# Patient Record
Sex: Female | Born: 1950 | Race: Black or African American | Hispanic: No | Marital: Single | Smoking: Never smoker
Health system: Southern US, Community
[De-identification: ages and names within clinical notes are randomized; demographics above are authoritative.]

## PROBLEM LIST (undated history)

## (undated) DIAGNOSIS — K219 Gastro-esophageal reflux disease without esophagitis: Secondary | ICD-10-CM

## (undated) DIAGNOSIS — D126 Benign neoplasm of colon, unspecified: Secondary | ICD-10-CM

## (undated) DIAGNOSIS — L409 Psoriasis, unspecified: Secondary | ICD-10-CM

## (undated) DIAGNOSIS — M1990 Unspecified osteoarthritis, unspecified site: Secondary | ICD-10-CM

## (undated) DIAGNOSIS — R87619 Unspecified abnormal cytological findings in specimens from cervix uteri: Secondary | ICD-10-CM

## (undated) DIAGNOSIS — E785 Hyperlipidemia, unspecified: Secondary | ICD-10-CM

## (undated) DIAGNOSIS — M71052 Abscess of bursa, left hip: Secondary | ICD-10-CM

## (undated) DIAGNOSIS — M069 Rheumatoid arthritis, unspecified: Secondary | ICD-10-CM

## (undated) DIAGNOSIS — I10 Essential (primary) hypertension: Secondary | ICD-10-CM

## (undated) HISTORY — DX: Hyperlipidemia, unspecified: E78.5

## (undated) HISTORY — PX: CYST EXCISION: SHX5701

## (undated) HISTORY — DX: Gastro-esophageal reflux disease without esophagitis: K21.9

## (undated) HISTORY — DX: Unspecified osteoarthritis, unspecified site: M19.90

## (undated) HISTORY — DX: Essential (primary) hypertension: I10

## (undated) HISTORY — DX: Unspecified abnormal cytological findings in specimens from cervix uteri: R87.619

---

## 2001-05-17 HISTORY — PX: ABDOMINAL HYSTERECTOMY: SHX81

## 2006-06-27 LAB — HM PAP SMEAR

## 2009-05-17 HISTORY — PX: BREAST CYST ASPIRATION: SHX578

## 2009-06-04 ENCOUNTER — Ambulatory Visit: Payer: Self-pay | Admitting: General Surgery

## 2009-06-10 ENCOUNTER — Ambulatory Visit: Payer: Self-pay | Admitting: General Surgery

## 2009-12-24 ENCOUNTER — Ambulatory Visit: Payer: Self-pay | Admitting: General Surgery

## 2010-05-17 HISTORY — PX: BREAST BIOPSY: SHX20

## 2010-05-28 ENCOUNTER — Ambulatory Visit: Payer: Self-pay | Admitting: Unknown Physician Specialty

## 2010-05-28 LAB — HM COLONOSCOPY

## 2010-05-29 LAB — PATHOLOGY REPORT

## 2011-03-04 ENCOUNTER — Ambulatory Visit: Payer: Self-pay

## 2012-03-09 ENCOUNTER — Ambulatory Visit: Payer: Self-pay | Admitting: Family Medicine

## 2013-03-12 ENCOUNTER — Ambulatory Visit: Payer: Self-pay

## 2013-03-15 ENCOUNTER — Ambulatory Visit: Payer: Self-pay

## 2013-03-26 ENCOUNTER — Ambulatory Visit: Payer: Self-pay

## 2013-07-24 DIAGNOSIS — M545 Low back pain, unspecified: Secondary | ICD-10-CM | POA: Diagnosis not present

## 2013-10-10 DIAGNOSIS — L659 Nonscarring hair loss, unspecified: Secondary | ICD-10-CM | POA: Diagnosis not present

## 2013-10-24 DIAGNOSIS — I10 Essential (primary) hypertension: Secondary | ICD-10-CM | POA: Diagnosis not present

## 2013-10-24 DIAGNOSIS — E785 Hyperlipidemia, unspecified: Secondary | ICD-10-CM | POA: Diagnosis not present

## 2013-10-24 DIAGNOSIS — E7800 Pure hypercholesterolemia, unspecified: Secondary | ICD-10-CM | POA: Diagnosis not present

## 2014-02-11 DIAGNOSIS — Z23 Encounter for immunization: Secondary | ICD-10-CM | POA: Diagnosis not present

## 2014-02-11 DIAGNOSIS — Z006 Encounter for examination for normal comparison and control in clinical research program: Secondary | ICD-10-CM | POA: Diagnosis not present

## 2014-02-11 DIAGNOSIS — I10 Essential (primary) hypertension: Secondary | ICD-10-CM | POA: Diagnosis not present

## 2014-02-11 DIAGNOSIS — E785 Hyperlipidemia, unspecified: Secondary | ICD-10-CM | POA: Diagnosis not present

## 2014-03-05 DIAGNOSIS — E785 Hyperlipidemia, unspecified: Secondary | ICD-10-CM | POA: Diagnosis not present

## 2014-03-06 DIAGNOSIS — S00512A Abrasion of oral cavity, initial encounter: Secondary | ICD-10-CM | POA: Diagnosis not present

## 2014-03-28 ENCOUNTER — Ambulatory Visit: Payer: Self-pay

## 2014-03-28 DIAGNOSIS — Z1231 Encounter for screening mammogram for malignant neoplasm of breast: Secondary | ICD-10-CM | POA: Diagnosis not present

## 2014-03-28 LAB — HM MAMMOGRAPHY

## 2014-09-04 DIAGNOSIS — E785 Hyperlipidemia, unspecified: Secondary | ICD-10-CM | POA: Diagnosis not present

## 2014-09-04 DIAGNOSIS — I10 Essential (primary) hypertension: Secondary | ICD-10-CM | POA: Diagnosis not present

## 2014-10-24 ENCOUNTER — Other Ambulatory Visit: Payer: Self-pay | Admitting: Unknown Physician Specialty

## 2014-11-11 ENCOUNTER — Other Ambulatory Visit: Payer: Self-pay | Admitting: Unknown Physician Specialty

## 2015-01-21 ENCOUNTER — Other Ambulatory Visit: Payer: Self-pay | Admitting: Unknown Physician Specialty

## 2015-02-10 ENCOUNTER — Other Ambulatory Visit: Payer: Self-pay | Admitting: Unknown Physician Specialty

## 2015-03-11 DIAGNOSIS — E785 Hyperlipidemia, unspecified: Secondary | ICD-10-CM | POA: Insufficient documentation

## 2015-03-11 DIAGNOSIS — M059 Rheumatoid arthritis with rheumatoid factor, unspecified: Secondary | ICD-10-CM | POA: Insufficient documentation

## 2015-03-11 DIAGNOSIS — I10 Essential (primary) hypertension: Secondary | ICD-10-CM | POA: Insufficient documentation

## 2015-03-11 DIAGNOSIS — M069 Rheumatoid arthritis, unspecified: Secondary | ICD-10-CM | POA: Insufficient documentation

## 2015-03-11 DIAGNOSIS — E669 Obesity, unspecified: Secondary | ICD-10-CM

## 2015-03-11 DIAGNOSIS — R748 Abnormal levels of other serum enzymes: Secondary | ICD-10-CM | POA: Insufficient documentation

## 2015-03-18 ENCOUNTER — Ambulatory Visit (INDEPENDENT_AMBULATORY_CARE_PROVIDER_SITE_OTHER): Payer: Medicare Other | Admitting: Unknown Physician Specialty

## 2015-03-18 ENCOUNTER — Encounter: Payer: Self-pay | Admitting: Unknown Physician Specialty

## 2015-03-18 VITALS — BP 128/73 | HR 80 | Temp 98.6°F | Ht 65.3 in | Wt 241.2 lb

## 2015-03-18 DIAGNOSIS — Z23 Encounter for immunization: Secondary | ICD-10-CM

## 2015-03-18 DIAGNOSIS — R748 Abnormal levels of other serum enzymes: Secondary | ICD-10-CM

## 2015-03-18 DIAGNOSIS — G4700 Insomnia, unspecified: Secondary | ICD-10-CM | POA: Diagnosis not present

## 2015-03-18 DIAGNOSIS — I10 Essential (primary) hypertension: Secondary | ICD-10-CM

## 2015-03-18 DIAGNOSIS — E785 Hyperlipidemia, unspecified: Secondary | ICD-10-CM | POA: Diagnosis not present

## 2015-03-18 DIAGNOSIS — Z0000 Encounter for general adult medical examination without abnormal findings: Secondary | ICD-10-CM | POA: Diagnosis not present

## 2015-03-18 DIAGNOSIS — J309 Allergic rhinitis, unspecified: Secondary | ICD-10-CM | POA: Diagnosis not present

## 2015-03-18 DIAGNOSIS — Z13820 Encounter for screening for osteoporosis: Secondary | ICD-10-CM

## 2015-03-18 DIAGNOSIS — E669 Obesity, unspecified: Secondary | ICD-10-CM | POA: Diagnosis not present

## 2015-03-18 LAB — MICROALBUMIN, URINE WAIVED
CREATININE, URINE WAIVED: 10 mg/dL (ref 10–300)
MICROALB, UR WAIVED: 10 mg/L (ref 0–19)
Microalb/Creat Ratio: <30 mg/g (ref <30)

## 2015-03-18 MED ORDER — MONTELUKAST SODIUM 10 MG PO TABS: 10.0000 mg | ORAL_TABLET | Freq: Every day | ORAL | Status: DC

## 2015-03-18 NOTE — Patient Instructions (Addendum)
Insomnia Insomnia is a sleep disorder that makes it difficult to fall asleep or to stay asleep. Insomnia can cause tiredness (fatigue), low energy, difficulty concentrating, mood swings, and poor performance at work or school.  There are three different ways to classify insomnia:  Difficulty falling asleep.  Difficulty staying asleep.  Waking up too early in the morning. Any type of insomnia can be long-term (chronic) or short-term (acute). Both are common. Short-term insomnia usually lasts for three months or less. Chronic insomnia occurs at least three times a week for longer than three months. CAUSES  Insomnia may be caused by another condition, situation, or substance, such as:  Anxiety.  Certain medicines.  Gastroesophageal reflux disease (GERD) or other gastrointestinal conditions.  Asthma or other breathing conditions.  Restless legs syndrome, sleep apnea, or other sleep disorders.  Chronic pain.  Menopause. This may include hot flashes.  Stroke.  Abuse of alcohol, tobacco, or illegal drugs.  Depression.  Caffeine.   Neurological disorders, such as Alzheimer disease.  An overactive thyroid (hyperthyroidism). The cause of insomnia may not be known. RISK FACTORS Risk factors for insomnia include:  Gender. Women are more commonly affected than men.  Age. Insomnia is more common as you get older.  Stress. This may involve your professional or personal life.  Income. Insomnia is more common in people with lower income.  Lack of exercise.   Irregular work schedule or night shifts.  Traveling between different time zones. SIGNS AND SYMPTOMS If you have insomnia, trouble falling asleep or trouble staying asleep is the main symptom. This may lead to other symptoms, such as:  Feeling fatigued.  Feeling nervous about going to sleep.  Not feeling rested in the morning.  Having trouble concentrating.  Feeling irritable, anxious, or depressed. TREATMENT   Treatment for insomnia depends on the cause. If your insomnia is caused by an underlying condition, treatment will focus on addressing the condition. Treatment may also include:   Medicines to help you sleep.  Counseling or therapy.  Lifestyle adjustments. HOME CARE INSTRUCTIONS   Take medicines only as directed by your health care provider.  Keep regular sleeping and waking hours. Avoid naps.  Keep a sleep diary to help you and your health care provider figure out what could be causing your insomnia. Include:   When you sleep.  When you wake up during the night.  How well you sleep.   How rested you feel the next day.  Any side effects of medicines you are taking.  What you eat and drink.   Make your bedroom a comfortable place where it is easy to fall asleep:  Put up shades or special blackout curtains to block light from outside.  Use a white noise machine to block noise.  Keep the temperature cool.   Exercise regularly as directed by your health care provider. Avoid exercising right before bedtime.  Use relaxation techniques to manage stress. Ask your health care provider to suggest some techniques that may work well for you. These may include:  Breathing exercises.  Routines to release muscle tension.  Visualizing peaceful scenes.  Cut back on alcohol, caffeinated beverages, and cigarettes, especially close to bedtime. These can disrupt your sleep.  Do not overeat or eat spicy foods right before bedtime. This can lead to digestive discomfort that can make it hard for you to sleep.  Limit screen use before bedtime. This includes:  Watching TV.  Using your smartphone, tablet, and computer.  Stick to a routine. This   can help you fall asleep faster. Try to do a quiet activity, brush your teeth, and go to bed at the same time each night.  Get out of bed if you are still awake after 15 minutes of trying to sleep. Keep the lights down, but try reading or  doing a quiet activity. When you feel sleepy, go back to bed.  Make sure that you drive carefully. Avoid driving if you feel very sleepy.  Keep all follow-up appointments as directed by your health care provider. This is important. SEEK MEDICAL CARE IF:   You are tired throughout the day or have trouble in your daily routine due to sleepiness.  You continue to have sleep problems or your sleep problems get worse. SEEK IMMEDIATE MEDICAL CARE IF:   You have serious thoughts about hurting yourself or someone else.   This information is not intended to replace advice given to you by your health care provider. Make sure you discuss any questions you have with your health care provider.   Document Released: 04/30/2000 Document Revised: 01/22/2015 Document Reviewed: 02/01/2014 Elsevier Interactive Patient Education 2016 [employer].   Insomnia is a frustrating problem and fortunately, for most, it can be managed with lifestyle changes.  I  find the most effective strategies seem to be exercise, decreasing caffeine and screen time.    In addition to the above, studies have shown that cognitive behavioral therapies for sleep are more effective than medications.  There are some less expensive on-line programs for this that are a self-paced 6 week program.  Go to shuti.com(used by sleep labs) or [employer].  There are others that are probably just as effective.

## 2015-03-18 NOTE — Assessment & Plan Note (Signed)
Noticing wheezing and nasal congestion in the AM

## 2015-03-18 NOTE — Progress Notes (Signed)
+-+++++++++++++ ---  BP 128/73 mmHg  Pulse 80  Temp(Src) 98.6 F (37 C)  Ht 5' 5.3" (1.659 m)  Wt 241 lb 3.2 oz (109.408 kg)  BMI 39.75 kg/m2  SpO2 96%  LMP  (LMP Unknown)   Subjective:    Patient ID: Angela Watts, female    DOB: 1951-05-06, 64 y.o.   MRN: [id number]  HPI: Angela Watts is a 64 y.o. female  Chief Complaint  Patient presents with  . Medicare Wellness   Hypertension:  Using medications without difficulty.  Second reading shows 128/73.   Average home BPs: does not check   Using medication without problems or lightheadedness No chest pain with exertion or shortness of breath but is noticing some "wheezing" in the AM.   Edema: no Other issues: as above  Elevated Cholesterol Using medications without problems Muscle aches none Diet compliance: good Exercise: "not as much as I should  Insomnia Goes to sleep "late" and watches TV.  States she wakes up at 4-5 A.    Depression screen PHQ 2/9 03/18/2015  Decreased Interest 0  Down, Depressed, Hopeless 0  PHQ - 2 Score 0    Functional Status Survey: Is the patient deaf or have difficulty hearing?: No Does the patient have difficulty seeing, even when wearing glasses/contacts?: No Does the patient have difficulty concentrating, remembering, or making decisions?: No Does the patient have difficulty walking or climbing stairs?: No Does the patient have difficulty dressing or bathing?: No Does the patient have difficulty doing errands alone such as visiting a doctor's office or shopping?: No  Fall Risk  03/18/2015  Falls in the past year? No   Relevant past medical, surgical, family and social history reviewed and updated as indicated. Interim medical history since our last visit reviewed. Allergies and medications reviewed and updated.  See functional status, depression screen, and fall's risk assessment  under the appropriate section.    Pt is able to perform complex mental tasks, recognize clock  face, recognize time and do a 3 item recall.       Relevant past medical, surgical, family and social history reviewed and updated as indicated. Interim medical history since our last visit reviewed. Allergies and medications reviewed and updated.  Review of Systems  Constitutional: Negative.   HENT: Negative.   Eyes: Negative.   Respiratory: Negative.   Cardiovascular: Negative.   Gastrointestinal: Negative.   Endocrine: Negative.   Genitourinary: Negative.   Musculoskeletal: Negative.   Skin: Negative.   Allergic/Immunologic: Negative.   Neurological: Negative.   Hematological: Negative.   Psychiatric/Behavioral: Negative.     Per HPI unless specifically indicated above     Objective:    BP 128/73 mmHg  Pulse 80  Temp(Src) 98.6 F (37 C)  Ht 5' 5.3" (1.659 m)  Wt 241 lb 3.2 oz (109.408 kg)  BMI 39.75 kg/m2  SpO2 96%  LMP  (LMP Unknown)  Wt Readings from Last 3 Encounters:  03/18/15 241 lb 3.2 oz (109.408 kg)  09/04/14 235 lb (106.595 kg)    Physical Exam  Constitutional: She is oriented to person, place, and time. She appears well-developed and well-nourished.  HENT:  Head: Normocephalic and atraumatic.  Eyes: Pupils are equal, round, and reactive to light. Right eye exhibits no discharge. Left eye exhibits no discharge. No scleral icterus.  Neck: Normal range of motion. Neck supple. Carotid bruit is not present. No thyromegaly present.  Cardiovascular: Normal rate, regular rhythm and normal heart sounds.  Exam reveals no  gallop and no friction rub.   No murmur heard. Pulmonary/Chest: Effort normal and breath sounds normal. No respiratory distress. She has no wheezes. She has no rales.  Abdominal: Soft. Bowel sounds are normal. There is no tenderness. There is no rebound.  Genitourinary: No breast swelling, tenderness or discharge.  Musculoskeletal: Normal range of motion.  Lymphadenopathy:    She has no cervical adenopathy.  Neurological: She is alert and  oriented to person, place, and time.  Skin: Skin is warm, dry and intact. No rash noted.  Psychiatric: She has a normal mood and affect. Her speech is normal and behavior is normal. Judgment and thought content normal. Cognition and memory are normal.       Assessment & Plan:   Problem List Items Addressed This Visit      Unprioritized   Hypertension   Relevant Medications   aspirin 81 MG tablet   Other Relevant Orders   Comprehensive metabolic panel   Lipid Panel w/o Chol/HDL Ratio   Uric acid   Microalbumin, Urine Waived   Hyperlipidemia   Relevant Medications   aspirin 81 MG tablet   Other Relevant Orders   Lipid Panel w/o Chol/HDL Ratio   Obesity   Elevated alkaline phosphatase level   Allergic rhinitis    Noticing wheezing and nasal congestion in the AM      Insomnia    Lifestyle changes discussed       Other Visit Diagnoses    Immunization due    -  Primary    Relevant Orders    Tdap vaccine greater than or equal to 7yo IM (Completed)    Flu Vaccine QUAD 36+ mos PF IM (Fluarix & Fluzone Quad PF) (Completed)    Routine general medical examination at a health care facility        Relevant Orders    Hepatitis C antibody    Screening for osteoporosis            Follow up plan: No Follow-up on file.

## 2015-03-18 NOTE — Assessment & Plan Note (Signed)
Lifestyle changes discussed

## 2015-03-19 ENCOUNTER — Encounter: Payer: Self-pay | Admitting: Unknown Physician Specialty

## 2015-03-19 LAB — COMPREHENSIVE METABOLIC PANEL
ALBUMIN: 3.8 g/dL (ref 3.6–4.8)
ALK PHOS: 192 IU/L — AB (ref 39–117)
ALT: 35 IU/L — ABNORMAL HIGH (ref 0–32)
AST: 26 IU/L (ref 0–40)
Albumin/Globulin Ratio: 1.2 (ref 1.1–2.5)
BUN / CREAT RATIO: 16 (ref 11–26)
BUN: 13 mg/dL (ref 8–27)
Bilirubin Total: 0.6 mg/dL (ref 0.0–1.2)
CO2: 21 mmol/L (ref 18–29)
CREATININE: 0.82 mg/dL (ref 0.57–1.00)
Calcium: 9.2 mg/dL (ref 8.7–10.3)
Chloride: 103 mmol/L (ref 97–106)
GFR, EST AFRICAN AMERICAN: 86 mL/min/{1.73_m2} (ref >59)
GFR, EST NON AFRICAN AMERICAN: 75 mL/min/{1.73_m2} (ref >59)
GLOBULIN, TOTAL: 3.3 g/dL (ref 1.5–4.5)
GLUCOSE: 89 mg/dL (ref 65–99)
Potassium: 4.1 mmol/L (ref 3.5–5.2)
SODIUM: 142 mmol/L (ref 136–144)
TOTAL PROTEIN: 7.1 g/dL (ref 6.0–8.5)

## 2015-03-19 LAB — LIPID PANEL W/O CHOL/HDL RATIO
CHOLESTEROL TOTAL: 172 mg/dL (ref 100–199)
HDL: 56 mg/dL (ref >39)
LDL CALC: 104 mg/dL — AB (ref 0–99)
Triglycerides: 59 mg/dL (ref 0–149)
VLDL CHOLESTEROL CAL: 12 mg/dL (ref 5–40)

## 2015-03-19 LAB — HEPATITIS C ANTIBODY: Hep C Virus Ab: <0.1 s/co ratio (ref 0.0–0.9)

## 2015-03-19 LAB — URIC ACID: URIC ACID: 6.6 mg/dL (ref 2.5–7.1)

## 2015-03-19 NOTE — Progress Notes (Signed)
Quick Note:  Letter sent to call us about getting an Ultrasound due to elevated Alk Phos ______

## 2015-03-20 ENCOUNTER — Telehealth: Payer: Self-pay | Admitting: Unknown Physician Specialty

## 2015-03-20 DIAGNOSIS — H2513 Age-related nuclear cataract, bilateral: Secondary | ICD-10-CM | POA: Diagnosis not present

## 2015-03-20 NOTE — Telephone Encounter (Signed)
Patient said someone called her regarding her results but she did not know who it was. It was not me, but I did follow-up. I called her and told her her labs were complete, but I did not want to tell her the results via phone without Cheryl's consent. But I did tell her she got a letter sent out 03/19/2015 with her results enclosed. She should get that letter within the next couple of days. She was very nice and said she was just trying to return the call for whoever called her. I asked her if she had any questions and she declined. Told her if she did, to please give [place] a call.

## 2015-03-20 NOTE — Telephone Encounter (Signed)
Please disregard. [facility] handled it. Thanks.

## 2015-03-21 ENCOUNTER — Other Ambulatory Visit: Payer: Self-pay | Admitting: Unknown Physician Specialty

## 2015-03-21 DIAGNOSIS — R748 Abnormal levels of other serum enzymes: Secondary | ICD-10-CM

## 2015-03-21 NOTE — Telephone Encounter (Signed)
Malachy Mood did you by any chance all this patient about lab results?

## 2015-03-21 NOTE — Telephone Encounter (Signed)
I did call and got hold of her today.  I ordered an abdominal US for elevated Alkaline Phosphotase

## 2015-04-14 ENCOUNTER — Ambulatory Visit
Admission: RE | Admit: 2015-04-14 | Discharge: 2015-04-14 | Disposition: A | Discharge status: Home / Self Care | Payer: Medicare Other | Source: Ambulatory Visit | Attending: Unknown Physician Specialty | Admitting: Unknown Physician Specialty

## 2015-04-14 DIAGNOSIS — R748 Abnormal levels of other serum enzymes: Secondary | ICD-10-CM | POA: Diagnosis not present

## 2015-04-23 ENCOUNTER — Other Ambulatory Visit: Payer: Self-pay | Admitting: Unknown Physician Specialty

## 2015-05-13 ENCOUNTER — Other Ambulatory Visit: Payer: Self-pay | Admitting: Unknown Physician Specialty

## 2015-06-11 ENCOUNTER — Ambulatory Visit: Payer: Medicare Other | Admitting: Anesthesiology

## 2015-06-11 ENCOUNTER — Encounter
Admission: RE | Disposition: A | Discharge status: Home / Self Care | Payer: Self-pay | Source: Ambulatory Visit | Attending: Unknown Physician Specialty

## 2015-06-11 ENCOUNTER — Ambulatory Visit
Admission: RE | Admit: 2015-06-11 | Discharge: 2015-06-11 | Disposition: A | Discharge status: Home / Self Care | Payer: Medicare Other | Source: Ambulatory Visit | Attending: Unknown Physician Specialty | Admitting: Unknown Physician Specialty

## 2015-06-11 ENCOUNTER — Encounter: Payer: Self-pay | Admitting: *Deleted

## 2015-06-11 DIAGNOSIS — D120 Benign neoplasm of cecum: Secondary | ICD-10-CM | POA: Insufficient documentation

## 2015-06-11 DIAGNOSIS — M1990 Unspecified osteoarthritis, unspecified site: Secondary | ICD-10-CM | POA: Insufficient documentation

## 2015-06-11 DIAGNOSIS — Z86010 Personal history of colonic polyps: Secondary | ICD-10-CM | POA: Diagnosis not present

## 2015-06-11 DIAGNOSIS — K5730 Diverticulosis of large intestine without perforation or abscess without bleeding: Secondary | ICD-10-CM | POA: Diagnosis not present

## 2015-06-11 DIAGNOSIS — I10 Essential (primary) hypertension: Secondary | ICD-10-CM | POA: Diagnosis not present

## 2015-06-11 DIAGNOSIS — E785 Hyperlipidemia, unspecified: Secondary | ICD-10-CM | POA: Insufficient documentation

## 2015-06-11 DIAGNOSIS — K635 Polyp of colon: Secondary | ICD-10-CM | POA: Diagnosis not present

## 2015-06-11 DIAGNOSIS — K640 First degree hemorrhoids: Secondary | ICD-10-CM | POA: Insufficient documentation

## 2015-06-11 DIAGNOSIS — Z7982 Long term (current) use of aspirin: Secondary | ICD-10-CM | POA: Insufficient documentation

## 2015-06-11 DIAGNOSIS — K219 Gastro-esophageal reflux disease without esophagitis: Secondary | ICD-10-CM | POA: Diagnosis not present

## 2015-06-11 DIAGNOSIS — Z79899 Other long term (current) drug therapy: Secondary | ICD-10-CM | POA: Diagnosis not present

## 2015-06-11 DIAGNOSIS — Z1211 Encounter for screening for malignant neoplasm of colon: Secondary | ICD-10-CM | POA: Insufficient documentation

## 2015-06-11 DIAGNOSIS — K2960 Other gastritis without bleeding: Secondary | ICD-10-CM | POA: Diagnosis not present

## 2015-06-11 DIAGNOSIS — K317 Polyp of stomach and duodenum: Secondary | ICD-10-CM | POA: Diagnosis not present

## 2015-06-11 DIAGNOSIS — D125 Benign neoplasm of sigmoid colon: Secondary | ICD-10-CM | POA: Insufficient documentation

## 2015-06-11 DIAGNOSIS — K5790 Diverticulosis of intestine, part unspecified, without perforation or abscess without bleeding: Secondary | ICD-10-CM | POA: Diagnosis not present

## 2015-06-11 DIAGNOSIS — Z6837 Body mass index (BMI) 37.0-37.9, adult: Secondary | ICD-10-CM | POA: Diagnosis not present

## 2015-06-11 DIAGNOSIS — K648 Other hemorrhoids: Secondary | ICD-10-CM | POA: Diagnosis not present

## 2015-06-11 HISTORY — PX: COLONOSCOPY WITH PROPOFOL: SHX5780

## 2015-06-11 SURGERY — COLONOSCOPY WITH PROPOFOL
Anesthesia: General

## 2015-06-11 MED ORDER — MIDAZOLAM HCL 2 MG/2ML IJ SOLN
INTRAMUSCULAR | Status: DC | PRN
  Administered 2015-06-11: 1 mg via INTRAVENOUS

## 2015-06-11 MED ORDER — SODIUM CHLORIDE 0.9 % IV SOLN
INTRAVENOUS | Status: DC
  Administered 2015-06-11: 09:00:00 via INTRAVENOUS

## 2015-06-11 MED ORDER — SODIUM CHLORIDE 0.9 % IV SOLN: INTRAVENOUS | Status: DC

## 2015-06-11 MED ORDER — FENTANYL CITRATE (PF) 100 MCG/2ML IJ SOLN
INTRAMUSCULAR | Status: DC | PRN
  Administered 2015-06-11: 1 ug via INTRAVENOUS
  Administered 2015-06-11: 49 ug via INTRAVENOUS

## 2015-06-11 MED ORDER — PROPOFOL 10 MG/ML IV BOLUS
INTRAVENOUS | Status: DC | PRN
  Administered 2015-06-11: 30 mg via INTRAVENOUS
  Administered 2015-06-11: 20 mg via INTRAVENOUS

## 2015-06-11 MED ORDER — PROPOFOL 500 MG/50ML IV EMUL
INTRAVENOUS | Status: DC | PRN
  Administered 2015-06-11: 120 ug/kg/min via INTRAVENOUS

## 2015-06-11 NOTE — Op Note (Signed)
[facility] Gastroenterology Patient Name: Angela Watts Procedure Date: 06/11/2015 9:30 AM MRN: [id number] Account #: [id number] Date of Birth: Feb 27, 1951 Admit Type: Outpatient Age: 65 Room: [facility] ENDO ROOM 4 Gender: Female Note Status: Finalized Procedure:         Colonoscopy Indications:       High risk colon cancer surveillance: Personal history of                     colonic polyps Providers:         Manya Silvas, MD Referring MD:      Guadalupe Maple, MD (Referring MD) Medicines:         Propofol per Anesthesia Complications:     No immediate complications. Procedure:         Pre-Anesthesia Assessment:                    - After reviewing the risks and benefits, the patient was                     deemed in satisfactory condition to undergo the procedure.                    After obtaining informed consent, the colonoscope was                     passed under direct vision. Throughout the procedure, the                     patient's blood pressure, pulse, and oxygen saturations                     were monitored continuously. The Colonoscope was                     introduced through the anus and advanced to the the cecum,                     identified by appendiceal orifice and ileocecal valve. The                     colonoscopy was performed without difficulty. The patient                     tolerated the procedure well. The quality of the bowel                     preparation was good. Findings:      A diminutive polyp was found in the cecum. The polyp was sessile. The       polyp was removed with a jumbo cold forceps. Resection and retrieval       were complete.      A diminutive polyp was found in the proximal sigmoid colon. The polyp       was sessile. The polyp was removed with a jumbo cold forceps. Resection       and retrieval were complete.      A few medium-mouthed diverticula were found in the sigmoid colon and in       the  descending colon.      Internal hemorrhoids were found during endoscopy. The hemorrhoids were       small and Grade I (internal hemorrhoids that do not prolapse).      The exam was otherwise without abnormality.  Impression:        - One diminutive polyp in the cecum. Resected and                     retrieved.                    - One diminutive polyp in the proximal sigmoid colon.                     Resected and retrieved.                    - Diverticulosis in the sigmoid colon and in the                     descending colon.                    - Internal hemorrhoids.                    - The examination was otherwise normal. Recommendation:    - Await pathology results. Manya Silvas, MD 06/11/2015 9:52:55 AM This report has been signed electronically. Number of Addenda: 0 Note Initiated On: 06/11/2015 9:30 AM Scope Withdrawal Time: 0 hours 11 minutes 12 seconds  Total Procedure Duration: 0 hours 15 minutes 27 seconds       [facility]

## 2015-06-11 NOTE — Anesthesia Preprocedure Evaluation (Signed)
Anesthesia Evaluation  Patient identified by MRN, date of birth, ID band Patient awake    Reviewed: Allergy & Precautions, NPO status , Patient's Chart, lab work & pertinent test results  History of Anesthesia Complications (+) MALIGNANT HYPERTHERMIA  Airway Mallampati: II       Dental  (+) Teeth Intact   Pulmonary neg pulmonary ROS,     + decreased breath sounds      Cardiovascular hypertension, Pt. on medications  Rhythm:Regular     Neuro/Psych negative neurological ROS     GI/Hepatic Neg liver ROS, GERD  ,  Endo/Other  Morbid obesity  Renal/GU negative Renal ROS     Musculoskeletal   Abdominal (+) + obese,   Peds  Hematology   Anesthesia Other Findings   Reproductive/Obstetrics                             Anesthesia Physical Anesthesia Plan  ASA: II  Anesthesia Plan: General   Post-op Pain Management:    Induction: Intravenous  Airway Management Planned:   Additional Equipment:   Intra-op Plan:   Post-operative Plan:   Informed Consent: I have reviewed the patients History and Physical, chart, labs and discussed the procedure including the risks, benefits and alternatives for the proposed anesthesia with the patient or authorized representative who has indicated his/her understanding and acceptance.     Plan Discussed with:   Anesthesia Plan Comments:         Anesthesia Quick Evaluation

## 2015-06-11 NOTE — H&P (Signed)
   Primary Care Physician:  Kathrine Haddock, NP Primary Gastroenterologist:  Dr. Vira Agar  Pre-Procedure History & Physical: HPI:  Angela Watts is a 66 y.o. female is here for an colonoscopy.   Past Medical History  Diagnosis Date  . Hyperlipidemia   . Hypertension   . GERD (gastroesophageal reflux disease)   . Arthritis   . Abnormal Pap smear of cervix     Past Surgical History  Procedure Laterality Date  . Abdominal hysterectomy    . Cyst excision Left     breast    Prior to Admission medications   Medication Sig Start Date End Date Taking? Authorizing Provider  aspirin 81 MG tablet Take 81 mg by mouth daily.   Yes Historical Provider, MD  atorvastatin (LIPITOR) 20 MG tablet TAKE ONE TABLET BY MOUTH ONCE DAILY 05/13/15  Yes Megan P Johnson, DO  Ca Phosphate-Cholecalciferol (CALTRATE GUMMY BITES) 250-400 MG-UNIT CHEW Chew by mouth daily.   Yes Historical Provider, MD  losartan (COZAAR) 50 MG tablet TAKE ONE TABLET BY MOUTH ONCE DAILY 04/23/15  Yes Kathrine Haddock, NP  Multiple Vitamin (MULTIVITAMIN) tablet Take 1 tablet by mouth daily.   Yes Historical Provider, MD  montelukast (SINGULAIR) 10 MG tablet Take 1 tablet (10 mg total) by mouth at bedtime. 03/18/15   Kathrine Haddock, NP    Allergies as of 05/09/2015  . (No Known Allergies)    Family History  Problem Relation Age of Onset  . Hypertension Mother   . Arthritis Mother   . Cancer Father     lung  . Hypertension Father   . Diabetes Sister   . Hypertension Sister   . Hypertension Brother   . Hypertension Sister   . Hypertension Sister   . Hypertension Sister   . Hypertension Brother   . Cancer Brother     lung    Social History   Social History  . Marital Status: Single    Spouse Name: N/A  . Number of Children: N/A  . Years of Education: N/A   Occupational History  . Not on file.   Social History Main Topics  . Smoking status: Never Smoker   . Smokeless tobacco: Never Used  . Alcohol Use: No   Comment: on occasion  . Drug Use: No  . Sexual Activity: No   Other Topics Concern  . Not on file   Social History Narrative    Review of Systems: See HPI, otherwise negative ROS  Physical Exam: BP 133/72 mmHg  Pulse 88  Temp(Src) 97.1 F (36.2 C) (Tympanic)  Resp 20  Ht 5\' 6"  (1.676 m)  Wt 106.595 kg (235 lb)  BMI 37.95 kg/m2  SpO2 99%  LMP  (LMP Unknown) General:   Alert,  pleasant and cooperative in NAD Head:  Normocephalic and atraumatic. Neck:  Supple; no masses or thyromegaly. Lungs:  Clear throughout to auscultation.    Heart:  Regular rate and rhythm. Abdomen:  Soft, nontender and nondistended. Normal bowel sounds, without guarding, and without rebound.   Neurologic:  Alert and  oriented x4;  grossly normal neurologically.  Impression/Plan: Angela Watts is here for an colonoscopy to be performed for [facility] colon polyps  Risks, benefits, limitations, and alternatives regarding  colonoscopy have been reviewed with the patient.  Questions have been answered.  All parties agreeable.   Gaylyn Cheers, MD  06/11/2015, 9:27 AM

## 2015-06-11 NOTE — Transfer of Care (Signed)
Immediate Anesthesia Transfer of Care Note  Patient: Angela Watts  Procedure(s) Performed: Procedure(s): COLONOSCOPY WITH PROPOFOL (N/A)  Patient Location: PACU  Anesthesia Type:General  Level of Consciousness: awake  Airway & Oxygen Therapy: Patient Spontanous Breathing and Patient connected to nasal cannula oxygen  Post-op Assessment: Report given to RN  Post vital signs: Reviewed and stable  Last Vitals:  Filed Vitals:   06/11/15 0830  BP: 133/72  Pulse: 88  Temp: 36.2 C  Resp: 20    Complications: No apparent anesthesia complications

## 2015-06-11 NOTE — Anesthesia Postprocedure Evaluation (Signed)
Anesthesia Post Note  Patient: Angela Watts  Procedure(s) Performed: Procedure(s) (LRB): COLONOSCOPY WITH PROPOFOL (N/A)  Patient location during evaluation: PACU Anesthesia Type: General Level of consciousness: awake Pain management: pain level controlled Vital Signs Assessment: post-procedure vital signs reviewed and stable Respiratory status: spontaneous breathing Cardiovascular status: stable Anesthetic complications: no    Last Vitals:  Filed Vitals:   06/11/15 0830  BP: 133/72  Pulse: 88  Temp: 36.2 C  Resp: 20    Last Pain: There were no vitals filed for this visit.               VAN STAVEREN,Lige Lakeman

## 2015-06-12 LAB — SURGICAL PATHOLOGY

## 2015-07-22 ENCOUNTER — Other Ambulatory Visit: Payer: Self-pay | Admitting: Unknown Physician Specialty

## 2015-08-20 ENCOUNTER — Other Ambulatory Visit: Payer: Self-pay | Admitting: Unknown Physician Specialty

## 2015-09-17 ENCOUNTER — Encounter: Payer: Self-pay | Admitting: Unknown Physician Specialty

## 2015-09-17 ENCOUNTER — Ambulatory Visit (INDEPENDENT_AMBULATORY_CARE_PROVIDER_SITE_OTHER): Payer: Medicare Other | Admitting: Unknown Physician Specialty

## 2015-09-17 ENCOUNTER — Other Ambulatory Visit: Payer: Self-pay | Admitting: Unknown Physician Specialty

## 2015-09-17 VITALS — BP 138/70 | HR 72 | Temp 97.9°F | Ht 65.1 in | Wt 243.0 lb

## 2015-09-17 DIAGNOSIS — E669 Obesity, unspecified: Secondary | ICD-10-CM | POA: Diagnosis not present

## 2015-09-17 DIAGNOSIS — I10 Essential (primary) hypertension: Secondary | ICD-10-CM

## 2015-09-17 DIAGNOSIS — R748 Abnormal levels of other serum enzymes: Secondary | ICD-10-CM

## 2015-09-17 DIAGNOSIS — E785 Hyperlipidemia, unspecified: Secondary | ICD-10-CM

## 2015-09-17 LAB — BAYER DCA HB A1C WAIVED: HB A1C: 5.8 % (ref <7.0)

## 2015-09-17 MED ORDER — ATORVASTATIN CALCIUM 20 MG PO TABS: 20.0000 mg | ORAL_TABLET | Freq: Every day | ORAL | Status: DC

## 2015-09-17 MED ORDER — LOSARTAN POTASSIUM 50 MG PO TABS: 50.0000 mg | ORAL_TABLET | Freq: Every day | ORAL | Status: DC

## 2015-09-17 NOTE — Assessment & Plan Note (Signed)
Stable, continue present medications.   

## 2015-09-17 NOTE — Progress Notes (Signed)
   BP 138/70 mmHg  Pulse 72  Temp(Src) 97.9 F (36.6 C)  Ht 5' 5.1" (1.654 m)  Wt 243 lb (110.224 kg)  BMI 40.29 kg/m2  SpO2 96%  LMP  (LMP Unknown)   Subjective:    Patient ID: Angela Watts, female    DOB: 09-30-1950, 65 y.o.   MRN: [id number]  HPI: Angela Watts is a 65 y.o. female  Chief Complaint  Patient presents with  . Hypertension  . Hyperlipidemia   Hypertension Using medications without difficulty Average home BPs130's/70s  No problems or lightheadedness No chest pain with exertion or shortness of breath No Edema   Hyperlipidemia Using medications without problems No Muscle aches  Diet compliance: variable Exercise: tries to go to the track 3-4 times/week and on the treadmill  Obesity It seems pt is doing a lot of snacking.  Mostly unhealthy food.    Relevant past medical, surgical, family and social history reviewed and updated as indicated. Interim medical history since our last visit reviewed. Allergies and medications reviewed and updated.  Review of Systems  Per HPI unless specifically indicated above     Objective:    BP 138/70 mmHg  Pulse 72  Temp(Src) 97.9 F (36.6 C)  Ht 5' 5.1" (1.654 m)  Wt 243 lb (110.224 kg)  BMI 40.29 kg/m2  SpO2 96%  LMP  (LMP Unknown)  Wt Readings from Last 3 Encounters:  09/17/15 243 lb (110.224 kg)  06/11/15 235 lb (106.595 kg)  03/18/15 241 lb 3.2 oz (109.408 kg)    Physical Exam  Constitutional: She is oriented to person, place, and time. She appears well-developed and well-nourished. No distress.  HENT:  Head: Normocephalic and atraumatic.  Eyes: Conjunctivae and lids are normal. Right eye exhibits no discharge. Left eye exhibits no discharge. No scleral icterus.  Neck: Normal range of motion. Neck supple. No JVD present. Carotid bruit is not present.  Cardiovascular: Normal rate, regular rhythm and normal heart sounds.   Pulmonary/Chest: Effort normal and breath sounds normal.  Abdominal:  Normal appearance. There is no splenomegaly or hepatomegaly.  Musculoskeletal: Normal range of motion.  Neurological: She is alert and oriented to person, place, and time.  Skin: Skin is warm, dry and intact. No rash noted. No pallor.  Psychiatric: She has a normal mood and affect. Her behavior is normal. Judgment and thought content normal.   Assessment & Plan:   Problem List Items Addressed This Visit      Unprioritized   Elevated alkaline phosphatase level    Check today      Hyperlipidemia    Stable, continue present medications.        Relevant Orders   Lipid Panel w/o Chol/HDL Ratio   Hypertension - Primary    Stable, continue present medications.        Relevant Orders   Comprehensive metabolic panel   Obesity    Having difficulty losing weight. She is open to seeing a nutritionist.        Relevant Orders   Bayer DCA Hb A1c Waived       Follow up plan: No Follow-up on file.

## 2015-09-17 NOTE — Assessment & Plan Note (Signed)
Check today 

## 2015-09-17 NOTE — Assessment & Plan Note (Signed)
Having difficulty losing weight. She is open to seeing a nutritionist.

## 2015-09-18 LAB — COMPREHENSIVE METABOLIC PANEL
ALBUMIN: 3.7 g/dL (ref 3.6–4.8)
ALK PHOS: 172 IU/L — AB (ref 39–117)
ALT: 30 IU/L (ref 0–32)
AST: 24 IU/L (ref 0–40)
Albumin/Globulin Ratio: 1.1 — ABNORMAL LOW (ref 1.2–2.2)
BILIRUBIN TOTAL: 0.4 mg/dL (ref 0.0–1.2)
BUN/Creatinine Ratio: 15 (ref 12–28)
BUN: 13 mg/dL (ref 8–27)
CHLORIDE: 104 mmol/L (ref 96–106)
CO2: 22 mmol/L (ref 18–29)
CREATININE: 0.84 mg/dL (ref 0.57–1.00)
Calcium: 8.9 mg/dL (ref 8.7–10.3)
GFR calc Af Amer: 83 mL/min/{1.73_m2} (ref >59)
GFR calc non Af Amer: 72 mL/min/{1.73_m2} (ref >59)
GLUCOSE: 87 mg/dL (ref 65–99)
Globulin, Total: 3.4 g/dL (ref 1.5–4.5)
Potassium: 4.1 mmol/L (ref 3.5–5.2)
Sodium: 143 mmol/L (ref 134–144)
Total Protein: 7.1 g/dL (ref 6.0–8.5)

## 2015-09-18 LAB — LIPID PANEL W/O CHOL/HDL RATIO
CHOLESTEROL TOTAL: 153 mg/dL (ref 100–199)
HDL: 55 mg/dL (ref >39)
LDL CALC: 87 mg/dL (ref 0–99)
TRIGLYCERIDES: 55 mg/dL (ref 0–149)
VLDL CHOLESTEROL CAL: 11 mg/dL (ref 5–40)

## 2015-09-19 ENCOUNTER — Encounter: Payer: Self-pay | Admitting: Unknown Physician Specialty

## 2015-09-19 NOTE — Progress Notes (Signed)
Quick Note:  Stable labs. Patient notified by letter. ______

## 2015-10-14 ENCOUNTER — Encounter: Payer: Self-pay | Admitting: Unknown Physician Specialty

## 2015-10-14 ENCOUNTER — Ambulatory Visit (INDEPENDENT_AMBULATORY_CARE_PROVIDER_SITE_OTHER): Payer: Medicare Other | Admitting: Unknown Physician Specialty

## 2015-10-14 VITALS — BP 152/74 | HR 72 | Temp 97.8°F | Ht 65.0 in | Wt 243.2 lb

## 2015-10-14 DIAGNOSIS — L309 Dermatitis, unspecified: Secondary | ICD-10-CM | POA: Diagnosis not present

## 2015-10-14 DIAGNOSIS — L409 Psoriasis, unspecified: Secondary | ICD-10-CM | POA: Insufficient documentation

## 2015-10-14 MED ORDER — BETAMETHASONE DIPROPIONATE AUG 0.05 % EX CREA: TOPICAL_CREAM | Freq: Two times a day (BID) | CUTANEOUS | Status: DC

## 2015-10-14 NOTE — Assessment & Plan Note (Signed)
Rx for Diprolene cream to use BID

## 2015-10-14 NOTE — Progress Notes (Signed)
BP 152/74 mmHg  Pulse 72  Temp(Src) 97.8 F (36.6 C)  Ht 5\' 5"  (1.651 m)  Wt 243 lb 3.2 oz (110.315 kg)  BMI 40.47 kg/m2  SpO2 96%  LMP  (LMP Unknown)   Subjective:    Patient ID: Angela Watts, female    DOB: 09-Jul-1950, 65 y.o.   MRN: [id number]  HPI: Angela Watts is a 65 y.o. female  Chief Complaint  Patient presents with  . Rash    pt states she has a itchy rash on both hands that started 3 weeks ago   States hands became itchy about 3 weeks ago.  States she got "gel nails" around that time and wonders if it is related. No new medications.  No new soaps detergents or cleaning supplies.  No gloves with washing dishes or cleaning house.  Washes her hands with eating or using the bathroom.  She has used cream and Calamine lotion  Relevant past medical, surgical, family and social history reviewed and updated as indicated. Interim medical history since our last visit reviewed. Allergies and medications reviewed and updated.  Review of Systems  Per HPI unless specifically indicated above     Objective:    BP 152/74 mmHg  Pulse 72  Temp(Src) 97.8 F (36.6 C)  Ht 5\' 5"  (1.651 m)  Wt 243 lb 3.2 oz (110.315 kg)  BMI 40.47 kg/m2  SpO2 96%  LMP  (LMP Unknown)  Wt Readings from Last 3 Encounters:  10/14/15 243 lb 3.2 oz (110.315 kg)  09/17/15 243 lb (110.224 kg)  06/11/15 235 lb (106.595 kg)    Physical Exam  Constitutional: She is oriented to person, place, and time. She appears well-developed and well-nourished. No distress.  HENT:  Head: Normocephalic and atraumatic.  Eyes: Conjunctivae and lids are normal. Right eye exhibits no discharge. Left eye exhibits no discharge. No scleral icterus.  Cardiovascular: Normal rate.   Pulmonary/Chest: Effort normal.  Abdominal: Normal appearance. There is no splenomegaly or hepatomegaly.  Musculoskeletal: Normal range of motion.  Neurological: She is alert and oriented to person, place, and time.  Skin: Skin is  intact. No rash noted. No pallor.  Pt with skin peeling bilateral palms of hands.    Psychiatric: She has a normal mood and affect. Her behavior is normal. Judgment and thought content normal.    Results for orders placed or performed in visit on 09/17/15  Comprehensive metabolic panel  Result Value Ref Range   Glucose 87 65 - 99 mg/dL   BUN 13 8 - 27 mg/dL   Creatinine, Ser 0.84 0.57 - 1.00 mg/dL   GFR calc non Af Amer 72 >59 mL/min/1.73   GFR calc Af Amer 83 >59 mL/min/1.73   BUN/Creatinine Ratio 15 12 - 28   Sodium 143 134 - 144 mmol/L   Potassium 4.1 3.5 - 5.2 mmol/L   Chloride 104 96 - 106 mmol/L   CO2 22 18 - 29 mmol/L   Calcium 8.9 8.7 - 10.3 mg/dL   Total Protein 7.1 6.0 - 8.5 g/dL   Albumin 3.7 3.6 - 4.8 g/dL   Globulin, Total 3.4 1.5 - 4.5 g/dL   Albumin/Globulin Ratio 1.1 (L) 1.2 - 2.2   Bilirubin Total 0.4 0.0 - 1.2 mg/dL   Alkaline Phosphatase 172 (H) 39 - 117 IU/L   AST 24 0 - 40 IU/L   ALT 30 0 - 32 IU/L  Lipid Panel w/o Chol/HDL Ratio  Result Value Ref Range   Cholesterol, Total  153 100 - 199 mg/dL   Triglycerides 55 0 - 149 mg/dL   HDL 55 >39 mg/dL   VLDL Cholesterol Cal 11 5 - 40 mg/dL   LDL Calculated 87 0 - 99 mg/dL  Bayer DCA Hb A1c Waived  Result Value Ref Range   Bayer DCA Hb A1c Waived 5.8 <7.0 %      Assessment & Plan:   Problem List Items Addressed This Visit      Unprioritized   Eczema of both hands - Primary    Rx for Diprolene cream to use BID          Follow up plan: Return if symptoms worsen or fail to improve.

## 2015-11-07 ENCOUNTER — Telehealth: Payer: Self-pay | Admitting: Unknown Physician Specialty

## 2015-11-07 MED ORDER — CLOTRIMAZOLE-BETAMETHASONE 1-0.05 % EX CREA: 1.0000 "application " | TOPICAL_CREAM | Freq: Two times a day (BID) | CUTANEOUS | Status: DC

## 2015-11-07 NOTE — Telephone Encounter (Signed)
Called and let patient know what Malachy Mood said and about rx.

## 2015-11-07 NOTE — Telephone Encounter (Signed)
Routing to provider  

## 2015-11-07 NOTE — Telephone Encounter (Signed)
Pt called stated the medicated cream Cheryl prescribed to treat her rash has not cleared it up. Wants to know if something stronger can be sent to the pharmacy. Pharm [facility] on [address]. Thanks.

## 2015-11-07 NOTE — Telephone Encounter (Signed)
In chart and sent.  If this does not work, she will need to see a dermatologist

## 2015-11-13 ENCOUNTER — Telehealth: Payer: Self-pay | Admitting: Unknown Physician Specialty

## 2015-11-13 DIAGNOSIS — L309 Dermatitis, unspecified: Secondary | ICD-10-CM

## 2015-11-13 NOTE — Telephone Encounter (Signed)
Pt called and stated that the medication she was given for her hand isn't working and she would like to know what she should do.

## 2015-11-14 NOTE — Telephone Encounter (Signed)
Called and let patient know that referral was entered. I asked for her to give [place] a call if she has not heard anything from our office or the dermatology office in a week or so.

## 2015-11-14 NOTE — Telephone Encounter (Signed)
Routing to provider for advice.

## 2015-11-14 NOTE — Telephone Encounter (Signed)
I think we should refer to dermatology.  I will put the referral in.

## 2015-12-05 DIAGNOSIS — L309 Dermatitis, unspecified: Secondary | ICD-10-CM | POA: Diagnosis not present

## 2016-01-27 DIAGNOSIS — L309 Dermatitis, unspecified: Secondary | ICD-10-CM | POA: Diagnosis not present

## 2016-02-13 ENCOUNTER — Encounter: Payer: Self-pay | Admitting: Unknown Physician Specialty

## 2016-02-13 ENCOUNTER — Ambulatory Visit (INDEPENDENT_AMBULATORY_CARE_PROVIDER_SITE_OTHER): Payer: Medicare Other | Admitting: Unknown Physician Specialty

## 2016-02-13 DIAGNOSIS — E669 Obesity, unspecified: Secondary | ICD-10-CM | POA: Diagnosis not present

## 2016-02-13 DIAGNOSIS — M1990 Unspecified osteoarthritis, unspecified site: Secondary | ICD-10-CM | POA: Insufficient documentation

## 2016-02-13 DIAGNOSIS — M170 Bilateral primary osteoarthritis of knee: Secondary | ICD-10-CM

## 2016-02-13 NOTE — Assessment & Plan Note (Signed)
Take Tylenol.  Ibuprofen more sparingly.  Increase walking and continue weight loss

## 2016-02-13 NOTE — Progress Notes (Signed)
BP 139/79 (BP Location: Left Arm, Cuff Size: Large)   Pulse 83   Temp 98.6 F (37 C)   Ht 5\' 6"  (1.676 m)   Wt 233 lb 12.8 oz (106.1 kg)   LMP  (LMP Unknown)   SpO2 98%   BMI 37.74 kg/m    Subjective:    Patient ID: Angela Watts, female    DOB: 04-28-1951, 65 y.o.   MRN: [id number]  HPI: Angela Watts is a 65 y.o. female  Chief Complaint  Patient presents with  . Knee Pain    pt states both knees have been bothering her off and on for about a month    Pt states she has not been able to walk on her treadmill due to an aching pain.  No fever or swelling.  When she gets up she is worse.  Ibuprofen helped.    Relevant past medical, surgical, family and social history reviewed and updated as indicated. Interim medical history since our last visit reviewed. Allergies and medications reviewed and updated.  Review of Systems  Per HPI unless specifically indicated above     Objective:    BP 139/79 (BP Location: Left Arm, Cuff Size: Large)   Pulse 83   Temp 98.6 F (37 C)   Ht 5\' 6"  (1.676 m)   Wt 233 lb 12.8 oz (106.1 kg)   LMP  (LMP Unknown)   SpO2 98%   BMI 37.74 kg/m   Wt Readings from Last 3 Encounters:  02/13/16 233 lb 12.8 oz (106.1 kg)  10/14/15 243 lb 3.2 oz (110.3 kg)  09/17/15 243 lb (110.2 kg)    Physical Exam  Constitutional: She is oriented to person, place, and time. She appears well-developed and well-nourished. No distress.  HENT:  Head: Normocephalic and atraumatic.  Eyes: Conjunctivae and lids are normal. Right eye exhibits no discharge. Left eye exhibits no discharge. No scleral icterus.  Cardiovascular: Normal rate.   Pulmonary/Chest: Effort normal.  Abdominal: Normal appearance. There is no splenomegaly or hepatomegaly.  Musculoskeletal:       Right knee: She exhibits decreased range of motion. She exhibits no swelling and no MCL laxity. No tenderness found.       Left knee: She exhibits decreased range of motion and swelling. She  exhibits no MCL laxity. No tenderness found.  Neurological: She is alert and oriented to person, place, and time.  Skin: Skin is intact. No rash noted. No pallor.  Psychiatric: She has a normal mood and affect. Her behavior is normal. Judgment and thought content normal.    Results for orders placed or performed in visit on 09/17/15  Comprehensive metabolic panel  Result Value Ref Range   Glucose 87 65 - 99 mg/dL   BUN 13 8 - 27 mg/dL   Creatinine, Ser 0.84 0.57 - 1.00 mg/dL   GFR calc non Af Amer 72 >59 mL/min/1.73   GFR calc Af Amer 83 >59 mL/min/1.73   BUN/Creatinine Ratio 15 12 - 28   Sodium 143 134 - 144 mmol/L   Potassium 4.1 3.5 - 5.2 mmol/L   Chloride 104 96 - 106 mmol/L   CO2 22 18 - 29 mmol/L   Calcium 8.9 8.7 - 10.3 mg/dL   Total Protein 7.1 6.0 - 8.5 g/dL   Albumin 3.7 3.6 - 4.8 g/dL   Globulin, Total 3.4 1.5 - 4.5 g/dL   Albumin/Globulin Ratio 1.1 (L) 1.2 - 2.2   Bilirubin Total 0.4 0.0 - 1.2 mg/dL  Alkaline Phosphatase 172 (H) 39 - 117 IU/L   AST 24 0 - 40 IU/L   ALT 30 0 - 32 IU/L  Lipid Panel w/o Chol/HDL Ratio  Result Value Ref Range   Cholesterol, Total 153 100 - 199 mg/dL   Triglycerides 55 0 - 149 mg/dL   HDL 55 >39 mg/dL   VLDL Cholesterol Cal 11 5 - 40 mg/dL   LDL Calculated 87 0 - 99 mg/dL  Bayer DCA Hb A1c Waived  Result Value Ref Range   Bayer DCA Hb A1c Waived 5.8 <7.0 %      Assessment & Plan:   Problem List Items Addressed This Visit      Unprioritized   Obesity    Lost 10 pounds recently through diet and exercise      Osteoarthritis    Take Tylenol.  Ibuprofen more sparingly.  Increase walking and continue weight loss       Other Visit Diagnoses   None.      Follow up plan: Return if symptoms worsen or fail to improve.

## 2016-02-13 NOTE — Assessment & Plan Note (Signed)
Lost 10 pounds recently through diet and exercise

## 2016-02-13 NOTE — Patient Instructions (Addendum)

## 2016-02-24 DIAGNOSIS — L400 Psoriasis vulgaris: Secondary | ICD-10-CM | POA: Diagnosis not present

## 2016-03-04 ENCOUNTER — Encounter (INDEPENDENT_AMBULATORY_CARE_PROVIDER_SITE_OTHER): Payer: Self-pay

## 2016-03-19 ENCOUNTER — Ambulatory Visit (INDEPENDENT_AMBULATORY_CARE_PROVIDER_SITE_OTHER): Payer: Medicare Other | Admitting: Unknown Physician Specialty

## 2016-03-19 ENCOUNTER — Encounter: Payer: Self-pay | Admitting: Unknown Physician Specialty

## 2016-03-19 VITALS — BP 126/74 | HR 83 | Temp 97.6°F | Ht 65.5 in | Wt 232.6 lb

## 2016-03-19 DIAGNOSIS — M170 Bilateral primary osteoarthritis of knee: Secondary | ICD-10-CM

## 2016-03-19 DIAGNOSIS — I10 Essential (primary) hypertension: Secondary | ICD-10-CM | POA: Diagnosis not present

## 2016-03-19 DIAGNOSIS — E7800 Pure hypercholesterolemia, unspecified: Secondary | ICD-10-CM

## 2016-03-19 DIAGNOSIS — M069 Rheumatoid arthritis, unspecified: Secondary | ICD-10-CM | POA: Diagnosis not present

## 2016-03-19 DIAGNOSIS — E2839 Other primary ovarian failure: Secondary | ICD-10-CM

## 2016-03-19 DIAGNOSIS — Z1231 Encounter for screening mammogram for malignant neoplasm of breast: Secondary | ICD-10-CM | POA: Diagnosis not present

## 2016-03-19 DIAGNOSIS — Z23 Encounter for immunization: Secondary | ICD-10-CM

## 2016-03-19 DIAGNOSIS — L409 Psoriasis, unspecified: Secondary | ICD-10-CM

## 2016-03-19 DIAGNOSIS — L659 Nonscarring hair loss, unspecified: Secondary | ICD-10-CM | POA: Diagnosis not present

## 2016-03-19 DIAGNOSIS — Z1239 Encounter for other screening for malignant neoplasm of breast: Secondary | ICD-10-CM

## 2016-03-19 MED ORDER — AMLODIPINE BESYLATE 5 MG PO TABS: 5.0000 mg | ORAL_TABLET | Freq: Every day | ORAL | 3 refills | Status: DC

## 2016-03-19 NOTE — Assessment & Plan Note (Signed)
Stable, continue present medications.   

## 2016-03-19 NOTE — Assessment & Plan Note (Signed)
Treated by dermatology

## 2016-03-19 NOTE — Assessment & Plan Note (Signed)
Working on diet

## 2016-03-19 NOTE — Progress Notes (Signed)
BP 126/74 (BP Location: Left Arm, Cuff Size: Large)   Pulse 83   Temp 97.6 F (36.4 C)   Ht 5' 5.5" (1.664 m)   Wt 232 lb 9.6 oz (105.5 kg)   LMP  (LMP Unknown)   SpO2 97%   BMI 38.12 kg/m    Subjective:    Patient ID: Angela Watts, female    DOB: 02-10-1951, 65 y.o.   MRN: [id number]  HPI: Angela Watts is a 65 y.o. female  Chief Complaint  Patient presents with  . Medicare Wellness   Functional Status Survey: Is the patient deaf or have difficulty hearing?: No Does the patient have difficulty seeing, even when wearing glasses/contacts?: No Does the patient have difficulty concentrating, remembering, or making decisions?: No Does the patient have difficulty walking or climbing stairs?: Yes (climbing stairs) Does the patient have difficulty dressing or bathing?: No Does the patient have difficulty doing errands alone such as visiting a doctor's office or shopping?: No  Fall Risk  03/19/2016 03/18/2015  Falls in the past year? No No    Past Medical History:  Diagnosis Date  . Abnormal Pap smear of cervix   . Arthritis   . GERD (gastroesophageal reflux disease)   . Hyperlipidemia   . Hypertension    Past Surgical History:  Procedure Laterality Date  . ABDOMINAL HYSTERECTOMY    . COLONOSCOPY WITH PROPOFOL N/A 06/11/2015   Procedure: COLONOSCOPY WITH PROPOFOL;  Surgeon: Manya Silvas, MD;  Location: [facility] ENDOSCOPY;  Service: Endoscopy;  Laterality: N/A;  . CYST EXCISION Left    breast   Depression screen [facility] 2/9 03/19/2016 03/18/2015  Decreased Interest 0 0  Down, Depressed, Hopeless 0 0  PHQ - 2 Score 0 0  Altered sleeping 0 -  Tired, decreased energy 0 -  Change in appetite 0 -  Feeling bad or failure about yourself  0 -  Trouble concentrating 0 -  Moving slowly or fidgety/restless 0 -  Suicidal thoughts 0 -  PHQ-9 Score 0 -    Hypertension Using medications without difficulty Average home BPs   No problems or lightheadedness No chest pain  with exertion or shortness of breath No Edema   Hyperlipidemia Using medications without problems: No Muscle aches  Diet compliance: Continuing her present diet changes and eating a lot of "green stuff" along with chicken and fish Exercise: walking 2-3 times/week  Hair thinning States she notes her hair is thinning.  This has been going on for a year.  Wonders if it's her BP meds  OA Knees better due to diet  Relevant past medical, surgical, family and social history reviewed and updated as indicated. Interim medical history since our last visit reviewed. Allergies and medications reviewed and updated.  Review of Systems  Constitutional: Negative.   HENT: Negative.   Eyes: Negative.   Respiratory: Negative.   Cardiovascular: Negative.   Gastrointestinal: Negative.   Endocrine: Negative.   Genitourinary: Negative.   Musculoskeletal: Negative.   Skin:       Seeing dermatology for psoriasis  Allergic/Immunologic: Negative.   Neurological: Negative.   Hematological: Negative.   Psychiatric/Behavioral: Negative.     Per HPI unless specifically indicated above     Objective:    BP 126/74 (BP Location: Left Arm, Cuff Size: Large)   Pulse 83   Temp 97.6 F (36.4 C)   Ht 5' 5.5" (1.664 m)   Wt 232 lb 9.6 oz (105.5 kg)   LMP  (LMP Unknown)  SpO2 97%   BMI 38.12 kg/m   Wt Readings from Last 3 Encounters:  03/19/16 232 lb 9.6 oz (105.5 kg)  02/13/16 233 lb 12.8 oz (106.1 kg)  10/14/15 243 lb 3.2 oz (110.3 kg)    Physical Exam  Constitutional: She is oriented to person, place, and time. She appears well-developed and well-nourished.  HENT:  Head: Normocephalic and atraumatic.  Eyes: Pupils are equal, round, and reactive to light. Right eye exhibits no discharge. Left eye exhibits no discharge. No scleral icterus.  Neck: Normal range of motion. Neck supple. Carotid bruit is not present. No thyromegaly present.  Cardiovascular: Normal rate, regular rhythm and normal  heart sounds.  Exam reveals no gallop and no friction rub.   No murmur heard. Pulmonary/Chest: Effort normal and breath sounds normal. No respiratory distress. She has no wheezes. She has no rales.  Abdominal: Soft. Bowel sounds are normal. There is no tenderness. There is no rebound.  Genitourinary: No breast swelling, tenderness or discharge.  Musculoskeletal: Normal range of motion.  Lymphadenopathy:    She has no cervical adenopathy.  Neurological: She is alert and oriented to person, place, and time.  Skin: Skin is warm, dry and intact. No rash noted.  Psychiatric: She has a normal mood and affect. Her speech is normal and behavior is normal. Judgment and thought content normal. Cognition and memory are normal.    Results for orders placed or performed in visit on 09/17/15  Comprehensive metabolic panel  Result Value Ref Range   Glucose 87 65 - 99 mg/dL   BUN 13 8 - 27 mg/dL   Creatinine, Ser 0.84 0.57 - 1.00 mg/dL   GFR calc non Af Amer 72 >59 mL/min/1.73   GFR calc Af Amer 83 >59 mL/min/1.73   BUN/Creatinine Ratio 15 12 - 28   Sodium 143 134 - 144 mmol/L   Potassium 4.1 3.5 - 5.2 mmol/L   Chloride 104 96 - 106 mmol/L   CO2 22 18 - 29 mmol/L   Calcium 8.9 8.7 - 10.3 mg/dL   Total Protein 7.1 6.0 - 8.5 g/dL   Albumin 3.7 3.6 - 4.8 g/dL   Globulin, Total 3.4 1.5 - 4.5 g/dL   Albumin/Globulin Ratio 1.1 (L) 1.2 - 2.2   Bilirubin Total 0.4 0.0 - 1.2 mg/dL   Alkaline Phosphatase 172 (H) 39 - 117 IU/L   AST 24 0 - 40 IU/L   ALT 30 0 - 32 IU/L  Lipid Panel w/o Chol/HDL Ratio  Result Value Ref Range   Cholesterol, Total 153 100 - 199 mg/dL   Triglycerides 55 0 - 149 mg/dL   HDL 55 >39 mg/dL   VLDL Cholesterol Cal 11 5 - 40 mg/dL   LDL Calculated 87 0 - 99 mg/dL  Bayer DCA Hb A1c Waived  Result Value Ref Range   Bayer DCA Hb A1c Waived 5.8 <7.0 %      Assessment & Plan:   Problem List Items Addressed This Visit      Unprioritized   Alopecia    Change Losartan to  amlodipine due to pt request.  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 Jefm Bryant due to other auto-immune issues      Relevant Orders   TSH   Hyperlipidemia    Stable, continue present medications.        Relevant Medications   amLODipine (NORVASC) 5 MG tablet   Other Relevant Orders   Comprehensive metabolic panel   Lipid Panel w/o Chol/HDL Ratio   Hypertension  Stable, continue present medications.        Relevant Medications   amLODipine (NORVASC) 5 MG tablet   Other Relevant Orders   Comprehensive metabolic panel   Osteoarthritis    Tylenol is helping      Psoriasis    Treated by dermatology      RA (rheumatoid arthritis) ([place])    Refer back due to alopecia and psoriasis      Relevant Orders   Ambulatory referral to Rheumatology   Comprehensive metabolic panel   CBC with Differential/Platelet    Other Visit Diagnoses    Need for influenza vaccination    -  Primary   Relevant Orders   Flu vaccine HIGH DOSE PF (Completed)   Breast cancer screening       Relevant Orders   MM DIGITAL SCREENING BILATERAL   Ovarian failure       Relevant Orders   DG Bone Density       Follow up plan: Return in about 4 weeks (around 04/16/2016).

## 2016-03-19 NOTE — Assessment & Plan Note (Signed)
Refer back due to alopecia and psoriasis

## 2016-03-19 NOTE — Assessment & Plan Note (Signed)
Tylenol is helping

## 2016-03-19 NOTE — Patient Instructions (Addendum)
Influenza (Flu) Vaccine (Inactivated or Recombinant):  1. Why get vaccinated? Influenza ("flu") is a contagious disease that spreads around the [place] every year, usually between October and May. Flu is caused by influenza viruses, and is spread mainly by coughing, sneezing, and close contact. Anyone can get flu. Flu strikes suddenly and can last several days. Symptoms vary by age, but can include:  fever/chills  sore throat  muscle aches  fatigue  cough  headache  runny or stuffy nose Flu can also lead to pneumonia and blood infections, and cause diarrhea and seizures in children. If you have a medical condition, such as heart or lung disease, flu can make it worse. Flu is more dangerous for some people. Infants and young children, people 65 years of age and older, pregnant women, and people with certain health conditions or a weakened immune system are at greatest risk. Each year thousands of people in the [place] die from flu, and many more are hospitalized. Flu vaccine can:  keep you from getting flu,  make flu less severe if you do get it, and  keep you from spreading flu to your family and other people. 2. Inactivated and recombinant flu vaccines A dose of flu vaccine is recommended every flu season. Children 6 months through 8 years of age may need two doses during the same flu season. Everyone else needs only one dose each flu season. Some inactivated flu vaccines contain a very small amount of a mercury-based preservative called thimerosal. Studies have not shown thimerosal in vaccines to be harmful, but flu vaccines that do not contain thimerosal are available. There is no live flu virus in flu shots. They cannot cause the flu. There are many flu viruses, and they are always changing. Each year a new flu vaccine is made to protect against three or four viruses that are likely to cause disease in the upcoming flu season. But even when the vaccine doesn't exactly  match these viruses, it may still provide some protection. Flu vaccine cannot prevent:  flu that is caused by a virus not covered by the vaccine, or  illnesses that look like flu but are not. It takes about 2 weeks for protection to develop after vaccination, and protection lasts through the flu season. 3. Some people should not get this vaccine Tell the person who is giving you the vaccine:  If you have any severe, life-threatening allergies. If you ever had a life-threatening allergic reaction after a dose of flu vaccine, or have a severe allergy to any part of this vaccine, you may be advised not to get vaccinated. Most, but not all, types of flu vaccine contain a small amount of egg protein.  If you ever had Guillain-Barre Syndrome (also called GBS). Some people with a history of GBS should not get this vaccine. This should be discussed with your doctor.  If you are not feeling well. It is usually okay to get flu vaccine when you have a mild illness, but you might be asked to come back when you feel better. 4. Risks of a vaccine reaction With any medicine, including vaccines, there is a chance of reactions. These are usually mild and go away on their own, but serious reactions are also possible. Most people who get a flu shot do not have any problems with it. Minor problems following a flu shot include:  soreness, redness, or swelling where the shot was given  hoarseness  sore, red or itchy eyes  cough    fever  aches  headache  itching  fatigue If these problems occur, they usually begin soon after the shot and last 1 or 2 days. More serious problems following a flu shot can include the following:  There may be a small increased risk of Guillain-Barre Syndrome (GBS) after inactivated flu vaccine. This risk has been estimated at 1 or 2 additional cases per million people vaccinated. This is much lower than the risk of severe complications from flu, which can be prevented by  flu vaccine.  Young children who get the flu shot along with pneumococcal vaccine (PCV13) and/or DTaP vaccine at the same time might be slightly more likely to have a seizure caused by fever. Ask your doctor for more information. Tell your doctor if a child who is getting flu vaccine has ever had a seizure. Problems that could happen after any injected vaccine:  People sometimes faint after a medical procedure, including vaccination. Sitting or lying down for about 15 minutes can help prevent fainting, and injuries caused by a fall. Tell your doctor if you feel dizzy, or have vision changes or ringing in the ears.  Some people get severe pain in the shoulder and have difficulty moving the arm where a shot was given. This happens very rarely.  Any medication can cause a severe allergic reaction. Such reactions from a vaccine are very rare, estimated at about 1 in a million doses, and would happen within a few minutes to a few hours after the vaccination. As with any medicine, there is a very remote chance of a vaccine causing a serious injury or death. The safety of vaccines is always being monitored. For more information, visit: www.cdc.gov/vaccinesafety/ 5. What if there is a serious reaction? What should I look for?  Look for anything that concerns you, such as signs of a severe allergic reaction, very high fever, or unusual behavior. Signs of a severe allergic reaction can include hives, swelling of the face and throat, difficulty breathing, a fast heartbeat, dizziness, and weakness. These would start a few minutes to a few hours after the vaccination. What should I do?  If you think it is a severe allergic reaction or other emergency that can't wait, call 9-1-1 and get the person to the nearest hospital. Otherwise, call your doctor.  Reactions should be reported to the Vaccine Adverse Event Reporting System (VAERS). Your doctor should file this report, or you can do it yourself through the  VAERS web site at www.vaers.hhs.gov, or by calling 1-800-822-7967. VAERS does not give medical advice. 6. The National Vaccine Injury Compensation Program The National Vaccine Injury Compensation Program (VICP) is a federal program that was created to compensate people who may have been injured by certain vaccines. Persons who believe they may have been injured by a vaccine can learn about the program and about filing a claim by calling 1-800-338-2382 or visiting the VICP website at www.hrsa.gov/vaccinecompensation. There is a time limit to file a claim for compensation. 7. How can I learn more?  Ask your healthcare provider. He or she can give you the vaccine package insert or suggest other sources of information.  Call your local or state health department.  Contact the Centers for Disease Control and Prevention (CDC):  Call 1-800-232-4636 (1-800-CDC-INFO) or  Visit CDC's website at www.cdc.gov/flu Vaccine Information Statement Inactivated Influenza Vaccine (12/21/2013)   This information is not intended to replace advice given to you by your health care provider. Make sure you discuss any questions you have with   your health care provider.    Psoriasis Psoriasis is a long-term (chronic) condition of skin inflammation. It occurs because your immune system causes skin cells to form too quickly. As a result, too many skin cells grow and create raised, red patches (plaques) that look silvery on your skin. Plaques may appear anywhere on your body. They can be any size or shape. Psoriasis can come and go. The condition varies from mild to very severe. It cannot be passed from one person to another (not contagious).  CAUSES  The cause of psoriasis is not known, but certain factors can make the condition worse. These include:   Damage or trauma to the skin, such as cuts, scrapes, sunburn, and dryness.  Lack of sunlight.  Certain medicines.  Alcohol.  Tobacco use.  Stress.  Infections  caused by bacteria or viruses. RISK FACTORS This condition is more likely to develop in:  People with a family history of psoriasis.  People who are Caucasian.  People who are between the ages of 15-45 and 25-50 years old. SYMPTOMS  There are five different types of psoriasis. You can have more than one type of psoriasis during your life. Types are:   Plaque.  Guttate.  Inverse.  Pustular.  Erythrodermic. Each type of psoriasis has different symptoms.   Plaque psoriasis symptoms include red, raised plaques with a silvery white coating (scale). These plaques may be itchy. Your nails may be pitted and crumbly or fall off.  Guttate psoriasis symptoms include small red spots that often show up on your trunk, arms, and legs. These spots may develop after you have been sick, especially with strep throat.  Inverse psoriasis symptoms include plaques in your underarm area, under your breasts, or on your genitals, groin, or buttocks.  Pustular psoriasis symptoms include pus-filled bumps that are painful, red, and swollen on the palms of your hands or the soles of your feet. You also may feel exhausted, feverish, weak, or have no appetite.  Erythrodermic psoriasis symptoms include bright red skin that may look burned. You may have a fast heartbeat and a body temperature that is too high or too low. You may be itchy or in pain. DIAGNOSIS  Your health care provider may suspect psoriasis based on your symptoms and family history. Your health care provider will also do a physical exam. This may include a procedure to remove a tissue sample (biopsy) for testing. You may also be referred to a health care provider who specializes in skin diseases (dermatologist).  TREATMENT There is no cure for this condition, but treatment can help manage it. Goals of treatment include:   Helping your skin heal.  Reducing itching and inflammation.  Slowing the growth of new skin cells.  Helping your immune  system respond better to your skin. Treatment varies, depending on the severity of your condition. Treatment may include:   Creams or ointments.  Ultraviolet ray exposure (light therapy). This may include natural sunlight or light therapy in a medical office.  Medicines (systemic therapy). These medicines can help your body better manage skin cell turnover and inflammation. They may be used along with light therapy or ointments. You may also get antibiotic medicines if you have an infection. HOME CARE INSTRUCTIONS Skin Care  Moisturize your skin as needed. Only use moisturizers that have been approved by your health care provider.   Apply cool compresses to the affected areas.   Do not scratch your skin.  Lifestyle  Do not use tobacco products. This  includes cigarettes, chewing tobacco, and e-cigarettes. If you need help quitting, ask your health care provider.  Drink little or no alcohol.   Try techniques for stress reduction, such as meditation or yoga.  Get exposure to the sun as told by your health care provider. Do not get sunburned.   Consider joining a psoriasis support group.  Medicines  Take or use over-the-counter and prescription medicines only as told by your health care provider.  If you were prescribed an antibiotic, take or use it as told by your health care provider. Do not stop taking the antibiotic even if your condition starts to improve. General Instructions  Keep a journal to help track what triggers an outbreak. Try to avoid any triggers.   See a counselor or social worker if feelings of sadness, frustration, and hopelessness about your condition are interfering with your work and relationships.  Keep all follow-up visits as told by your health care provider. This is important. SEEK MEDICAL CARE IF:  Your pain gets worse.  You have increasing redness or warmth in the affected areas.   You have new or worsening pain or stiffness in your  joints.  Your nails start to break easily or pull away from the nail bed.   You have a fever.   You feel depressed.   This information is not intended to replace advice given to you by your health care provider. Make sure you discuss any questions you have with your health care provider.   Please do call to schedule your mammogram; the number to schedule one at either [facility] or [facility] is [phone]

## 2016-03-19 NOTE — Assessment & Plan Note (Signed)
Change Losartan to amlodipine due to pt request.  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 Jefm Bryant due to other auto-immune issues

## 2016-03-20 LAB — COMPREHENSIVE METABOLIC PANEL
A/G RATIO: 1.1 — AB (ref 1.2–2.2)
ALK PHOS: 179 IU/L — AB (ref 39–117)
ALT: 32 IU/L (ref 0–32)
AST: 21 IU/L (ref 0–40)
Albumin: 3.9 g/dL (ref 3.6–4.8)
BILIRUBIN TOTAL: 0.6 mg/dL (ref 0.0–1.2)
BUN/Creatinine Ratio: 11 — ABNORMAL LOW (ref 12–28)
BUN: 10 mg/dL (ref 8–27)
CHLORIDE: 102 mmol/L (ref 96–106)
CO2: 24 mmol/L (ref 18–29)
Calcium: 9.3 mg/dL (ref 8.7–10.3)
Creatinine, Ser: 0.88 mg/dL (ref 0.57–1.00)
GFR calc non Af Amer: 68 mL/min/{1.73_m2} (ref >59)
GFR, EST AFRICAN AMERICAN: 79 mL/min/{1.73_m2} (ref >59)
GLUCOSE: 92 mg/dL (ref 65–99)
Globulin, Total: 3.4 g/dL (ref 1.5–4.5)
POTASSIUM: 4.5 mmol/L (ref 3.5–5.2)
Sodium: 140 mmol/L (ref 134–144)
Total Protein: 7.3 g/dL (ref 6.0–8.5)

## 2016-03-20 LAB — CBC WITH DIFFERENTIAL/PLATELET
Basophils Absolute: 0 10*3/uL (ref 0.0–0.2)
Basos: 1 %
EOS (ABSOLUTE): 0.2 10*3/uL (ref 0.0–0.4)
Eos: 4 %
HEMATOCRIT: 38.1 % (ref 34.0–46.6)
HEMOGLOBIN: 13.1 g/dL (ref 11.1–15.9)
IMMATURE GRANULOCYTES: 0 %
Immature Grans (Abs): 0 10*3/uL (ref 0.0–0.1)
LYMPHS ABS: 2 10*3/uL (ref 0.7–3.1)
LYMPHS: 47 %
MCH: 29.2 pg (ref 26.6–33.0)
MCHC: 34.4 g/dL (ref 31.5–35.7)
MCV: 85 fL (ref 79–97)
MONOS ABS: 0.4 10*3/uL (ref 0.1–0.9)
Monocytes: 10 %
NEUTROS PCT: 38 %
Neutrophils Absolute: 1.6 10*3/uL (ref 1.4–7.0)
Platelets: 303 10*3/uL (ref 150–379)
RBC: 4.49 x10E6/uL (ref 3.77–5.28)
RDW: 14.1 % (ref 12.3–15.4)
WBC: 4.2 10*3/uL (ref 3.4–10.8)

## 2016-03-20 LAB — LIPID PANEL W/O CHOL/HDL RATIO
Cholesterol, Total: 160 mg/dL (ref 100–199)
HDL: 52 mg/dL (ref >39)
LDL CALC: 97 mg/dL (ref 0–99)
TRIGLYCERIDES: 56 mg/dL (ref 0–149)
VLDL Cholesterol Cal: 11 mg/dL (ref 5–40)

## 2016-03-20 LAB — TSH: TSH: 1.63 u[IU]/mL (ref 0.450–4.500)

## 2016-03-22 ENCOUNTER — Encounter: Payer: Self-pay | Admitting: Unknown Physician Specialty

## 2016-03-25 DIAGNOSIS — H2513 Age-related nuclear cataract, bilateral: Secondary | ICD-10-CM | POA: Diagnosis not present

## 2016-03-30 DIAGNOSIS — M25562 Pain in left knee: Secondary | ICD-10-CM | POA: Diagnosis not present

## 2016-03-30 DIAGNOSIS — M19041 Primary osteoarthritis, right hand: Secondary | ICD-10-CM | POA: Diagnosis not present

## 2016-03-30 DIAGNOSIS — M1990 Unspecified osteoarthritis, unspecified site: Secondary | ICD-10-CM | POA: Diagnosis not present

## 2016-03-30 DIAGNOSIS — M25561 Pain in right knee: Secondary | ICD-10-CM | POA: Insufficient documentation

## 2016-03-30 DIAGNOSIS — M170 Bilateral primary osteoarthritis of knee: Secondary | ICD-10-CM | POA: Diagnosis not present

## 2016-03-30 DIAGNOSIS — Z8739 Personal history of other diseases of the musculoskeletal system and connective tissue: Secondary | ICD-10-CM | POA: Insufficient documentation

## 2016-03-30 DIAGNOSIS — L409 Psoriasis, unspecified: Secondary | ICD-10-CM | POA: Diagnosis not present

## 2016-03-30 DIAGNOSIS — Z0389 Encounter for observation for other suspected diseases and conditions ruled out: Secondary | ICD-10-CM | POA: Diagnosis not present

## 2016-04-13 DIAGNOSIS — Z79899 Other long term (current) drug therapy: Secondary | ICD-10-CM | POA: Insufficient documentation

## 2016-04-13 DIAGNOSIS — M059 Rheumatoid arthritis with rheumatoid factor, unspecified: Secondary | ICD-10-CM | POA: Diagnosis not present

## 2016-04-13 DIAGNOSIS — Z7189 Other specified counseling: Secondary | ICD-10-CM | POA: Insufficient documentation

## 2016-04-13 DIAGNOSIS — L409 Psoriasis, unspecified: Secondary | ICD-10-CM | POA: Diagnosis not present

## 2016-04-13 DIAGNOSIS — M170 Bilateral primary osteoarthritis of knee: Secondary | ICD-10-CM | POA: Diagnosis not present

## 2016-04-16 ENCOUNTER — Ambulatory Visit: Payer: Medicare Other | Admitting: Unknown Physician Specialty

## 2016-04-16 ENCOUNTER — Ambulatory Visit (INDEPENDENT_AMBULATORY_CARE_PROVIDER_SITE_OTHER): Payer: Medicare Other | Admitting: Unknown Physician Specialty

## 2016-04-16 ENCOUNTER — Encounter: Payer: Self-pay | Admitting: Unknown Physician Specialty

## 2016-04-16 DIAGNOSIS — I10 Essential (primary) hypertension: Secondary | ICD-10-CM

## 2016-04-16 NOTE — Progress Notes (Signed)
BP 137/73 (BP Location: Left Arm, Patient Position: Sitting, Cuff Size: Large)   Pulse 86   Temp 97.5 F (36.4 C)   Wt 234 lb (106.1 kg)   LMP  (LMP Unknown)   SpO2 96%   BMI 38.35 kg/m    Subjective:    Patient ID: Angela Watts, female    DOB: [birth year], 65 y.o.   MRN: [id number]  HPI: Angela Watts is a 65 y.o. female  Chief Complaint  Patient presents with  . Follow-up    1 month f/up, medication changed at last visit   Hypertension Recheck after changing from Losartan to Amlodipine due to hair thinning.  Using medications without difficulty Average home BPs Not checking   No problems or lightheadedness No chest pain with exertion or shortness of breath No Edema   Relevant past medical, surgical, family and social history reviewed and updated as indicated. Interim medical history since our last visit reviewed. Allergies and medications reviewed and updated.  Review of Systems  Per HPI unless specifically indicated above     Objective:    BP 137/73 (BP Location: Left Arm, Patient Position: Sitting, Cuff Size: Large)   Pulse 86   Temp 97.5 F (36.4 C)   Wt 234 lb (106.1 kg)   LMP  (LMP Unknown)   SpO2 96%   BMI 38.35 kg/m   Wt Readings from Last 3 Encounters:  04/16/16 234 lb (106.1 kg)  03/19/16 232 lb 9.6 oz (105.5 kg)  02/13/16 233 lb 12.8 oz (106.1 kg)    Physical Exam  Constitutional: She is oriented to person, place, and time. She appears well-developed and well-nourished. No distress.  HENT:  Head: Normocephalic and atraumatic.  Eyes: Conjunctivae and lids are normal. Right eye exhibits no discharge. Left eye exhibits no discharge. No scleral icterus.  Neck: Normal range of motion. Neck supple. No JVD present. Carotid bruit is not present.  Cardiovascular: Normal rate, regular rhythm and normal heart sounds.   Pulmonary/Chest: Effort normal and breath sounds normal.  Abdominal: Normal appearance. There is no splenomegaly or  hepatomegaly.  Musculoskeletal: Normal range of motion.  Neurological: She is alert and oriented to person, place, and time.  Skin: Skin is warm, dry and intact. No rash noted. No pallor.  Psychiatric: She has a normal mood and affect. Her behavior is normal. Judgment and thought content normal.    Results for orders placed or performed in visit on 03/19/16  Comprehensive metabolic panel  Result Value Ref Range   Glucose 92 65 - 99 mg/dL   BUN 10 8 - 27 mg/dL   Creatinine, Ser 0.88 0.57 - 1.00 mg/dL   GFR calc non Af Amer 68 >59 mL/min/1.73   GFR calc Af Amer 79 >59 mL/min/1.73   BUN/Creatinine Ratio 11 (L) 12 - 28   Sodium 140 134 - 144 mmol/L   Potassium 4.5 3.5 - 5.2 mmol/L   Chloride 102 96 - 106 mmol/L   CO2 24 18 - 29 mmol/L   Calcium 9.3 8.7 - 10.3 mg/dL   Total Protein 7.3 6.0 - 8.5 g/dL   Albumin 3.9 3.6 - 4.8 g/dL   Globulin, Total 3.4 1.5 - 4.5 g/dL   Albumin/Globulin Ratio 1.1 (L) 1.2 - 2.2   Bilirubin Total 0.6 0.0 - 1.2 mg/dL   Alkaline Phosphatase 179 (H) 39 - 117 IU/L   AST 21 0 - 40 IU/L   ALT 32 0 - 32 IU/L  CBC with Differential/Platelet  Result  Value Ref Range   WBC 4.2 3.4 - 10.8 x10E3/uL   RBC 4.49 3.77 - 5.28 x10E6/uL   Hemoglobin 13.1 11.1 - 15.9 g/dL   Hematocrit 38.1 34.0 - 46.6 %   MCV 85 79 - 97 fL   MCH 29.2 26.6 - 33.0 pg   MCHC 34.4 31.5 - 35.7 g/dL   RDW 14.1 12.3 - 15.4 %   Platelets 303 150 - 379 x10E3/uL   Neutrophils 38 Not Estab. %   Lymphs 47 Not Estab. %   Monocytes 10 Not Estab. %   Eos 4 Not Estab. %   Basos 1 Not Estab. %   Neutrophils Absolute 1.6 1.4 - 7.0 x10E3/uL   Lymphocytes Absolute 2.0 0.7 - 3.1 x10E3/uL   Monocytes Absolute 0.4 0.1 - 0.9 x10E3/uL   EOS (ABSOLUTE) 0.2 0.0 - 0.4 x10E3/uL   Basophils Absolute 0.0 0.0 - 0.2 x10E3/uL   Immature Granulocytes 0 Not Estab. %   Immature Grans (Abs) 0.0 0.0 - 0.1 x10E3/uL  Lipid Panel w/o Chol/HDL Ratio  Result Value Ref Range   Cholesterol, Total 160 100 - 199 mg/dL    Triglycerides 56 0 - 149 mg/dL   HDL 52 >39 mg/dL   VLDL Cholesterol Cal 11 5 - 40 mg/dL   LDL Calculated 97 0 - 99 mg/dL  TSH  Result Value Ref Range   TSH 1.630 0.450 - 4.500 uIU/mL  -    Assessment & Plan:   Problem List Items Addressed This Visit      Unprioritized   Hypertension    BP not to goal.  Will monitor at home and recheck in 3 months          Follow up plan: Return in about 3 months (around 07/15/2016).

## 2016-04-16 NOTE — Assessment & Plan Note (Signed)
BP not to goal.  Will monitor at home and recheck in 3 months

## 2016-05-05 ENCOUNTER — Ambulatory Visit
Admission: RE | Admit: 2016-05-05 | Discharge: 2016-05-05 | Disposition: A | Discharge status: Home / Self Care | Payer: Medicare Other | Source: Ambulatory Visit | Attending: Unknown Physician Specialty | Admitting: Unknown Physician Specialty

## 2016-05-05 DIAGNOSIS — Z1231 Encounter for screening mammogram for malignant neoplasm of breast: Secondary | ICD-10-CM | POA: Insufficient documentation

## 2016-05-05 DIAGNOSIS — Z1239 Encounter for other screening for malignant neoplasm of breast: Secondary | ICD-10-CM

## 2016-05-05 DIAGNOSIS — Z780 Asymptomatic menopausal state: Secondary | ICD-10-CM | POA: Insufficient documentation

## 2016-05-05 DIAGNOSIS — E2839 Other primary ovarian failure: Secondary | ICD-10-CM

## 2016-05-11 ENCOUNTER — Other Ambulatory Visit: Payer: Self-pay | Admitting: Unknown Physician Specialty

## 2016-05-11 DIAGNOSIS — R928 Other abnormal and inconclusive findings on diagnostic imaging of breast: Secondary | ICD-10-CM

## 2016-05-11 DIAGNOSIS — N6320 Unspecified lump in the left breast, unspecified quadrant: Secondary | ICD-10-CM

## 2016-05-12 ENCOUNTER — Other Ambulatory Visit: Payer: Self-pay | Admitting: Unknown Physician Specialty

## 2016-05-12 NOTE — Telephone Encounter (Signed)
Routing to provider, this was already filled yesterday.

## 2016-05-14 ENCOUNTER — Encounter: Payer: Self-pay | Admitting: Unknown Physician Specialty

## 2016-05-24 ENCOUNTER — Other Ambulatory Visit: Payer: Self-pay | Admitting: Unknown Physician Specialty

## 2016-05-24 ENCOUNTER — Ambulatory Visit
Admission: RE | Admit: 2016-05-24 | Discharge: 2016-05-24 | Disposition: A | Discharge status: Home / Self Care | Payer: Medicare Other | Source: Ambulatory Visit | Attending: Unknown Physician Specialty | Admitting: Unknown Physician Specialty

## 2016-05-24 ENCOUNTER — Telehealth: Payer: Self-pay | Admitting: Family Medicine

## 2016-05-24 DIAGNOSIS — N6320 Unspecified lump in the left breast, unspecified quadrant: Secondary | ICD-10-CM

## 2016-05-24 DIAGNOSIS — R928 Other abnormal and inconclusive findings on diagnostic imaging of breast: Secondary | ICD-10-CM

## 2016-05-24 DIAGNOSIS — N6314 Unspecified lump in the right breast, lower inner quadrant: Secondary | ICD-10-CM | POA: Insufficient documentation

## 2016-05-24 DIAGNOSIS — N6323 Unspecified lump in the left breast, lower outer quadrant: Secondary | ICD-10-CM | POA: Diagnosis not present

## 2016-05-24 NOTE — Telephone Encounter (Signed)
Called and spoke to patient. She would like to see Dr. Bary Castilla- referral made today.

## 2016-05-24 NOTE — Telephone Encounter (Signed)
Called and [facility] for her to call back. Had abnormal mammogram today and was recommended to have US guided biopsy- usually when this happens, we refer our patients to Dr. Bary Castilla to do this. If she is OK with that we will get that appointment for her.

## 2016-05-24 NOTE — Telephone Encounter (Signed)
Pt returned call.  Pt request that someone call her back.

## 2016-05-25 ENCOUNTER — Encounter: Payer: Self-pay | Admitting: *Deleted

## 2016-05-25 ENCOUNTER — Telehealth: Payer: Self-pay

## 2016-05-25 NOTE — Telephone Encounter (Signed)
Patient notified

## 2016-05-25 NOTE — Telephone Encounter (Signed)
Called and cancelled appointment at [facility], appointment scheduled at [facility] for a breast biopsy.  Appt: 06/01/16 @ 8:30am arrive @ 8:15am Patient will see Dr. Tollie Pizza, new patient packet will be mailed to her.  Left message for patient to return my call so that I can give her the appointment date and time.

## 2016-06-01 ENCOUNTER — Ambulatory Visit (INDEPENDENT_AMBULATORY_CARE_PROVIDER_SITE_OTHER): Payer: Medicare Other | Admitting: General Surgery

## 2016-06-01 ENCOUNTER — Encounter: Payer: Self-pay | Admitting: General Surgery

## 2016-06-01 ENCOUNTER — Inpatient Hospital Stay: Payer: Self-pay

## 2016-06-01 VITALS — BP 152/64 | HR 88 | Resp 16 | Ht 66.0 in | Wt 234.0 lb

## 2016-06-01 DIAGNOSIS — R928 Other abnormal and inconclusive findings on diagnostic imaging of breast: Secondary | ICD-10-CM

## 2016-06-01 DIAGNOSIS — N6320 Unspecified lump in the left breast, unspecified quadrant: Secondary | ICD-10-CM

## 2016-06-01 DIAGNOSIS — D242 Benign neoplasm of left breast: Secondary | ICD-10-CM | POA: Diagnosis not present

## 2016-06-01 HISTORY — PX: BREAST CYST ASPIRATION: SHX578

## 2016-06-01 HISTORY — PX: BREAST BIOPSY: SHX20

## 2016-06-01 NOTE — Progress Notes (Signed)
Patient ID: Angela Watts, female   DOB: [birth year], 66 y.o.   MRN: [id number]  Chief Complaint  Patient presents with  . Breast Problem    HPI Angela Watts is a 66 y.o. female.  who presents for a breast evaluation. The most recent mammogram was done on 05-06-16 additional views and ultrasound on 05-24-16.  Patient does perform regular self breast checks and gets regular mammograms done.  She states she could not feel anything different in the breast. She does admit to the breast feeling "heavy". Denies any breast injury. She retired in 2010 from [employer].  HPI  Past Medical History:  Diagnosis Date  . Abnormal Pap smear of cervix   . Arthritis   . GERD (gastroesophageal reflux disease)   . Hyperlipidemia   . Hypertension     Past Surgical History:  Procedure Laterality Date  . ABDOMINAL HYSTERECTOMY  2003  . BREAST CYST ASPIRATION Left 2011   NEG/ Br Kalika Smay  . COLONOSCOPY WITH PROPOFOL N/A 06/11/2015   Procedure: COLONOSCOPY WITH PROPOFOL;  Surgeon: Manya Silvas, MD;  Location: [facility] ENDOSCOPY;  Service: Endoscopy;  Laterality: N/A;  . CYST EXCISION Left    breast    Family History  Problem Relation Age of Onset  . Hypertension Mother   . Arthritis Mother   . Cancer Father     lung  . Hypertension Father   . Diabetes Sister   . Hypertension Sister   . Hypertension Brother   . Hypertension Sister   . Hypertension Sister   . Hypertension Sister   . Hypertension Brother   . Cancer Brother     lung  . Breast cancer Neg Hx     Social History Social History  Substance Use Topics  . Smoking status: Never Smoker  . Smokeless tobacco: Never Used  . Alcohol use 0.0 oz/week     Comment: on occasion/socially    No Known Allergies  Current Outpatient Prescriptions  Medication Sig Dispense Refill  . amLODipine (NORVASC) 5 MG tablet Take 1 tablet (5 mg total) by mouth daily. 90 tablet 3  . aspirin 81 MG tablet Take 81 mg by mouth daily.    Marland Kitchen atorvastatin  (LIPITOR) 20 MG tablet TAKE ONE TABLET BY MOUTH ONCE DAILY 90 tablet 0  . augmented betamethasone dipropionate (DIPROLENE AF) 0.05 % cream Apply topically 2 (two) times daily. 30 g 0  . Ca Phosphate-Cholecalciferol (CALTRATE GUMMY BITES) 250-400 MG-UNIT CHEW Chew by mouth daily.    . clotrimazole-betamethasone (LOTRISONE) cream Apply 1 application topically 2 (two) times daily. 30 g 0  . folic acid (FOLVITE) 1 MG tablet Take 1 mg by mouth daily.    . methotrexate (RHEUMATREX) 2.5 MG tablet Take 4 tabs once a week for the first 6 weeks then increase to 6 tablets once a week.    . montelukast (SINGULAIR) 10 MG tablet Take 1 tablet (10 mg total) by mouth at bedtime. 90 tablet 3  . Multiple Vitamin (MULTIVITAMIN) tablet Take 1 tablet by mouth daily.     No current facility-administered medications for this visit.     Review of Systems Review of Systems  Constitutional: Negative.   Respiratory: Negative.   Cardiovascular: Negative.     Blood pressure (!) 152/64, pulse 88, resp. rate 16, height 5\' 6"  (1.676 m), weight 234 lb (106.1 kg).  Physical Exam Physical Exam  Constitutional: She is oriented to person, place, and time. She appears well-developed and well-nourished.  HENT:  Mouth/Throat: Oropharynx  is clear and moist.  Eyes: Conjunctivae are normal. No scleral icterus.  Neck: Neck supple.  Cardiovascular: Normal rate, regular rhythm and normal heart sounds.   Pulmonary/Chest: Effort normal and breath sounds normal. Right breast exhibits no inverted nipple, no mass, no nipple discharge, no skin change and no tenderness. Left breast exhibits no inverted nipple, no mass, no nipple discharge, no skin change and no tenderness.    Lymphadenopathy:    She has no cervical adenopathy.    She has no axillary adenopathy.  Neurological: She is alert and oriented to person, place, and time.  Skin: Skin is warm and dry.  Psychiatric: Her behavior is normal.    Data Reviewed Screening  mammograms of 05/05/2016, diagnostic imaging of 05/24/2016 and ultrasound of the same latter date were reviewed. Primary areas of concern was an oval hypoechoic mass of the 5:00 position 3 cm from the nipple measuring up to 8 mm in diameter. No internal vascularity. At the 6:00 position an intraductal mass measuring up to 1.1 cm in diameter was reported. BI-RADS-4.  01/13/2010 office notes when the patient was post excision of retroareolar ductal tissue for recurrent bloody drainage showed no discernible breast abnormality. 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 No atypia or malignancy.  Ultrasound examination of the left breast showed multiple prominent ducts identified in the retroareolar area. Several suggest cystic termination. At the 5:00 position, 3 cm from the nipple a slightly smaller 0.4 x 0.5 x 0.52 cm irregular tall is an wide hypoechoic mass with faint acoustic enhancement correlating with the hospital study was identified. Making use of 1 mL of 1% plain Xylocaine this was aspirated with complete resolution.   In the retroareolar area at the 6:00 position, 2 cm from nipple a complex multilobulated area suspected to represent a dilated duct with an intraductal lesion measuring up to 1 cm in diameter was identified. This area was multilobulated with no discernible posterior acoustic enhancement or shadowing. BI-RADS-4.   The patient was amenable to vacuum biopsy. 10 mL of 0.5% Xylocaine with 0.25% Marcaine was utilized. A 10-gauge Encor device was advanced under ultrasound guidance and the lesion was completely removed. A postbiopsy clip was placed. Scant bleeding was noted. Skin defect closed with benzoin and Steri-Strips followed by Telfa and Tegaderm dressing. Postbiopsy instructions provided.  Assessment    Prominent retroareolar ductal tissue and patient previously requiring resection of an intraductal papilloma.    Plan     The patient will be contacted when biopsy results are available.  Nursing follow-up in one week.      This information has been scribed by Karie Fetch RN, BSN,BC.   Angela Watts 06/02/2016, 8:09 AM

## 2016-06-01 NOTE — Patient Instructions (Addendum)

## 2016-06-02 DIAGNOSIS — R928 Other abnormal and inconclusive findings on diagnostic imaging of breast: Secondary | ICD-10-CM | POA: Insufficient documentation

## 2016-06-08 ENCOUNTER — Ambulatory Visit (INDEPENDENT_AMBULATORY_CARE_PROVIDER_SITE_OTHER): Payer: Medicare Other | Admitting: *Deleted

## 2016-06-08 DIAGNOSIS — R928 Other abnormal and inconclusive findings on diagnostic imaging of breast: Secondary | ICD-10-CM

## 2016-06-08 NOTE — Patient Instructions (Signed)
Return as scheduled 

## 2016-06-08 NOTE — Progress Notes (Signed)
Patient here today for follow up post left breast biopsy.  Dressing removed, steristrip in place and aware it may come off in one week.  Minimal bruising noted.  The patient is aware that a heating pad may be used for comfort as needed.  Follow up as scheduled.

## 2016-06-30 DIAGNOSIS — L409 Psoriasis, unspecified: Secondary | ICD-10-CM | POA: Diagnosis not present

## 2016-06-30 DIAGNOSIS — M170 Bilateral primary osteoarthritis of knee: Secondary | ICD-10-CM | POA: Diagnosis not present

## 2016-06-30 DIAGNOSIS — M059 Rheumatoid arthritis with rheumatoid factor, unspecified: Secondary | ICD-10-CM | POA: Diagnosis not present

## 2016-06-30 DIAGNOSIS — Z79899 Other long term (current) drug therapy: Secondary | ICD-10-CM | POA: Diagnosis not present

## 2016-07-16 ENCOUNTER — Ambulatory Visit: Payer: Medicare Other | Admitting: Unknown Physician Specialty

## 2016-07-19 ENCOUNTER — Encounter: Payer: Self-pay | Admitting: Unknown Physician Specialty

## 2016-07-19 ENCOUNTER — Ambulatory Visit (INDEPENDENT_AMBULATORY_CARE_PROVIDER_SITE_OTHER): Payer: Medicare Other | Admitting: Unknown Physician Specialty

## 2016-07-19 DIAGNOSIS — I10 Essential (primary) hypertension: Secondary | ICD-10-CM | POA: Diagnosis not present

## 2016-07-19 MED ORDER — AMLODIPINE BESYLATE 5 MG PO TABS: 5.0000 mg | ORAL_TABLET | Freq: Every day | ORAL | 1 refills | Status: DC

## 2016-07-19 NOTE — Progress Notes (Signed)
BP 133/79 (BP Location: Left Arm, Cuff Size: Large)   Pulse 71   Temp 98.3 F (36.8 C)   Ht 5' 4.6" (1.641 m)   Wt 233 lb 9.6 oz (106 kg)   LMP  (LMP Unknown)   SpO2 96%   BMI 39.36 kg/m    Subjective:    Patient ID: Angela Watts, female    DOB: [birth year], 66 y.o.   MRN: [id number]  HPI: Angela Watts is a 66 y.o. female  Chief Complaint  Patient presents with  . Hypertension    3 month f/up    Relevant past medical, surgical, family and social history reviewed and updated as indicated. Interim medical history since our last visit reviewed. Allergies and medications reviewed and updated.  Review of Systems  Per HPI unless specifically indicated above     Objective:    BP 133/79 (BP Location: Left Arm, Cuff Size: Large)   Pulse 71   Temp 98.3 F (36.8 C)   Ht 5' 4.6" (1.641 m)   Wt 233 lb 9.6 oz (106 kg)   LMP  (LMP Unknown)   SpO2 96%   BMI 39.36 kg/m   Wt Readings from Last 3 Encounters:  07/19/16 233 lb 9.6 oz (106 kg)  06/01/16 234 lb (106.1 kg)  04/16/16 234 lb (106.1 kg)    Physical Exam  Results for orders placed or performed in visit on 03/19/16  Comprehensive metabolic panel  Result Value Ref Range   Glucose 92 65 - 99 mg/dL   BUN 10 8 - 27 mg/dL   Creatinine, Ser 0.88 0.57 - 1.00 mg/dL   GFR calc non Af Amer 68 >59 mL/min/1.73   GFR calc Af Amer 79 >59 mL/min/1.73   BUN/Creatinine Ratio 11 (L) 12 - 28   Sodium 140 134 - 144 mmol/L   Potassium 4.5 3.5 - 5.2 mmol/L   Chloride 102 96 - 106 mmol/L   CO2 24 18 - 29 mmol/L   Calcium 9.3 8.7 - 10.3 mg/dL   Total Protein 7.3 6.0 - 8.5 g/dL   Albumin 3.9 3.6 - 4.8 g/dL   Globulin, Total 3.4 1.5 - 4.5 g/dL   Albumin/Globulin Ratio 1.1 (L) 1.2 - 2.2   Bilirubin Total 0.6 0.0 - 1.2 mg/dL   Alkaline Phosphatase 179 (H) 39 - 117 IU/L   AST 21 0 - 40 IU/L   ALT 32 0 - 32 IU/L  CBC with Differential/Platelet  Result Value Ref Range   WBC 4.2 3.4 - 10.8 x10E3/uL   RBC 4.49 3.77 - 5.28  x10E6/uL   Hemoglobin 13.1 11.1 - 15.9 g/dL   Hematocrit 38.1 34.0 - 46.6 %   MCV 85 79 - 97 fL   MCH 29.2 26.6 - 33.0 pg   MCHC 34.4 31.5 - 35.7 g/dL   RDW 14.1 12.3 - 15.4 %   Platelets 303 150 - 379 x10E3/uL   Neutrophils 38 Not Estab. %   Lymphs 47 Not Estab. %   Monocytes 10 Not Estab. %   Eos 4 Not Estab. %   Basos 1 Not Estab. %   Neutrophils Absolute 1.6 1.4 - 7.0 x10E3/uL   Lymphocytes Absolute 2.0 0.7 - 3.1 x10E3/uL   Monocytes Absolute 0.4 0.1 - 0.9 x10E3/uL   EOS (ABSOLUTE) 0.2 0.0 - 0.4 x10E3/uL   Basophils Absolute 0.0 0.0 - 0.2 x10E3/uL   Immature Granulocytes 0 Not Estab. %   Immature Grans (Abs) 0.0 0.0 - 0.1 x10E3/uL  Lipid  Panel w/o Chol/HDL Ratio  Result Value Ref Range   Cholesterol, Total 160 100 - 199 mg/dL   Triglycerides 56 0 - 149 mg/dL   HDL 52 >39 mg/dL   VLDL Cholesterol Cal 11 5 - 40 mg/dL   LDL Calculated 97 0 - 99 mg/dL  TSH  Result Value Ref Range   TSH 1.630 0.450 - 4.500 uIU/mL      Assessment & Plan:   Problem List Items Addressed This Visit    None       Follow up plan: No Follow-up on file.

## 2016-07-19 NOTE — Progress Notes (Signed)
   BP 133/79 (BP Location: Left Arm, Cuff Size: Large)   Pulse 71   Temp 98.3 F (36.8 C)   Ht 5' 4.6" (1.641 m)   Wt 233 lb 9.6 oz (106 kg)   LMP  (LMP Unknown)   SpO2 96%   BMI 39.36 kg/m    Subjective:    Patient ID: Angela Watts, female    DOB: 1950/06/05, 66 y.o.   MRN: [id number]  HPI: Angela Watts is a 66 y.o. female  Chief Complaint  Patient presents with  . Hypertension    3 month f/up   Hypertension  Using Norvasc 5 mg without difficulty and states she doesn't feel the hair thinning is as bad as when she was on Losartan. Average home BPs  SBP 121-141.  Using medication without problems or lightheadedness No chest pain with exertion or shortness of breath No Edema  Relevant past medical, surgical, family and social history reviewed and updated as indicated. Interim medical history since our last visit reviewed. Allergies and medications reviewed and updated.  Review of Systems  Per HPI unless specifically indicated above     Objective:    BP 133/79 (BP Location: Left Arm, Cuff Size: Large)   Pulse 71   Temp 98.3 F (36.8 C)   Ht 5' 4.6" (1.641 m)   Wt 233 lb 9.6 oz (106 kg)   LMP  (LMP Unknown)   SpO2 96%   BMI 39.36 kg/m   Wt Readings from Last 3 Encounters:  07/19/16 233 lb 9.6 oz (106 kg)  06/01/16 234 lb (106.1 kg)  04/16/16 234 lb (106.1 kg)    Physical Exam  Constitutional: She is oriented to person, place, and time. She appears well-developed and well-nourished. No distress.  HENT:  Head: Normocephalic and atraumatic.  Eyes: Conjunctivae are normal. Right eye exhibits no discharge. Left eye exhibits no discharge. No scleral icterus.  Neck: Normal range of motion. Neck supple. No JVD present.  Cardiovascular: Normal rate, regular rhythm, normal heart sounds and intact distal pulses.   Pulmonary/Chest: Effort normal and breath sounds normal. No respiratory distress. She has no wheezes. She has no rales.  Abdominal: Soft. Bowel  sounds are normal.  Musculoskeletal: Normal range of motion.  Neurological: She is alert and oriented to person, place, and time.  Skin: Skin is warm and dry.  Psychiatric: She has a normal mood and affect. Her behavior is normal. Judgment and thought content normal.      Assessment & Plan:   Problem List Items Addressed This Visit    Hypertension    Stable at 133/79. Continue current medications.      Relevant Medications   amLODipine (NORVASC) 5 MG tablet       Follow up plan: Return in about 6 months (around 01/19/2017) for physical.

## 2016-07-19 NOTE — Assessment & Plan Note (Signed)
Stable at 133/79. Continue current medications.

## 2016-07-22 ENCOUNTER — Encounter: Payer: Self-pay | Admitting: General Surgery

## 2016-07-27 ENCOUNTER — Emergency Department: Payer: No Typology Code available for payment source

## 2016-07-27 ENCOUNTER — Encounter: Payer: Self-pay | Admitting: Emergency Medicine

## 2016-07-27 ENCOUNTER — Emergency Department
Admission: EM | Admit: 2016-07-27 | Discharge: 2016-07-27 | Disposition: A | Discharge status: Home / Self Care | Payer: No Typology Code available for payment source | Attending: Emergency Medicine | Admitting: Emergency Medicine

## 2016-07-27 DIAGNOSIS — Y9389 Activity, other specified: Secondary | ICD-10-CM | POA: Diagnosis not present

## 2016-07-27 DIAGNOSIS — Y92410 Unspecified street and highway as the place of occurrence of the external cause: Secondary | ICD-10-CM | POA: Insufficient documentation

## 2016-07-27 DIAGNOSIS — Z7982 Long term (current) use of aspirin: Secondary | ICD-10-CM | POA: Diagnosis not present

## 2016-07-27 DIAGNOSIS — I10 Essential (primary) hypertension: Secondary | ICD-10-CM | POA: Diagnosis not present

## 2016-07-27 DIAGNOSIS — Y999 Unspecified external cause status: Secondary | ICD-10-CM | POA: Insufficient documentation

## 2016-07-27 DIAGNOSIS — S299XXA Unspecified injury of thorax, initial encounter: Secondary | ICD-10-CM | POA: Diagnosis present

## 2016-07-27 DIAGNOSIS — S20212A Contusion of left front wall of thorax, initial encounter: Secondary | ICD-10-CM | POA: Diagnosis not present

## 2016-07-27 DIAGNOSIS — Z79899 Other long term (current) drug therapy: Secondary | ICD-10-CM | POA: Diagnosis not present

## 2016-07-27 DIAGNOSIS — R079 Chest pain, unspecified: Secondary | ICD-10-CM | POA: Diagnosis not present

## 2016-07-27 NOTE — Discharge Instructions (Signed)
Please take Tylenol as needed for pain. Try to walk and stretch. Apply ice to the chest wall 20 minutes every hour. Return to the ER for any worsening symptoms urgent changes in her health.
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
## 2016-08-09 ENCOUNTER — Other Ambulatory Visit: Payer: Self-pay | Admitting: Family Medicine

## 2016-08-09 NOTE — Telephone Encounter (Signed)
Routing to provider. Next appt 03/22/17.

## 2016-09-18 ENCOUNTER — Emergency Department
Admission: EM | Admit: 2016-09-18 | Discharge: 2016-09-18 | Disposition: A | Discharge status: Home / Self Care | Payer: No Typology Code available for payment source | Attending: Emergency Medicine | Admitting: Emergency Medicine

## 2016-09-18 DIAGNOSIS — Y92410 Unspecified street and highway as the place of occurrence of the external cause: Secondary | ICD-10-CM | POA: Diagnosis not present

## 2016-09-18 DIAGNOSIS — S299XXA Unspecified injury of thorax, initial encounter: Secondary | ICD-10-CM | POA: Diagnosis present

## 2016-09-18 DIAGNOSIS — Z79899 Other long term (current) drug therapy: Secondary | ICD-10-CM | POA: Diagnosis not present

## 2016-09-18 DIAGNOSIS — R079 Chest pain, unspecified: Secondary | ICD-10-CM | POA: Diagnosis not present

## 2016-09-18 DIAGNOSIS — I10 Essential (primary) hypertension: Secondary | ICD-10-CM | POA: Diagnosis not present

## 2016-09-18 DIAGNOSIS — Z7982 Long term (current) use of aspirin: Secondary | ICD-10-CM | POA: Insufficient documentation

## 2016-09-18 DIAGNOSIS — Y939 Activity, unspecified: Secondary | ICD-10-CM | POA: Insufficient documentation

## 2016-09-18 DIAGNOSIS — Y999 Unspecified external cause status: Secondary | ICD-10-CM | POA: Insufficient documentation

## 2016-09-18 DIAGNOSIS — S20219A Contusion of unspecified front wall of thorax, initial encounter: Secondary | ICD-10-CM | POA: Diagnosis not present

## 2016-09-18 NOTE — Discharge Instructions (Signed)
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
## 2016-09-22 ENCOUNTER — Encounter: Payer: Self-pay | Admitting: Unknown Physician Specialty

## 2016-09-22 ENCOUNTER — Ambulatory Visit (INDEPENDENT_AMBULATORY_CARE_PROVIDER_SITE_OTHER): Payer: Medicare Other | Admitting: Unknown Physician Specialty

## 2016-09-22 VITALS — BP 135/78 | HR 74 | Temp 98.5°F | Wt 234.8 lb

## 2016-09-22 DIAGNOSIS — R0789 Other chest pain: Secondary | ICD-10-CM | POA: Diagnosis not present

## 2016-09-22 DIAGNOSIS — S8001XA Contusion of right knee, initial encounter: Secondary | ICD-10-CM

## 2016-09-22 NOTE — Patient Instructions (Addendum)
Chest Wall Pain °Chest wall pain is pain in or around the bones and muscles of your chest. Sometimes, an injury causes this pain. Sometimes, the cause may not be known. This pain may take several weeks or longer to get better. °Follow these instructions at home: °Pay attention to any changes in your symptoms. Take these actions to help with your pain: °· Rest as told by your health care provider. °· Avoid activities that cause pain. These include any activities that use your chest muscles or your abdominal and side muscles to lift heavy items. °· If directed, apply ice to the painful area: °¨ Put ice in a plastic bag. °¨ Place a towel between your skin and the bag. °¨ Leave the ice on for 20 minutes, 2-3 times per day. °· Take over-the-counter and prescription medicines only as told by your health care provider. °· Do not use tobacco products, including cigarettes, chewing tobacco, and e-cigarettes. If you need help quitting, ask your health care provider. °· Keep all follow-up visits as told by your health care provider. This is important. °Contact a health care provider if: °· You have a fever. °· Your chest pain becomes worse. °· You have new symptoms. °Get help right away if: °· You have nausea or vomiting. °· You feel sweaty or light-headed. °· You have a cough with phlegm (sputum) or you cough up blood. °· You develop shortness of breath. °This information is not intended to replace advice given to you by your health care provider. Make sure you discuss any questions you have with your health care provider. °Document Released: 05/03/2005 Document Revised: 09/11/2015 Document Reviewed: 07/29/2014 °Elsevier Interactive Patient Education © 2017 Elsevier Inc. ° °
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
## 2016-09-27 DIAGNOSIS — M059 Rheumatoid arthritis with rheumatoid factor, unspecified: Secondary | ICD-10-CM | POA: Diagnosis not present

## 2016-09-27 DIAGNOSIS — M170 Bilateral primary osteoarthritis of knee: Secondary | ICD-10-CM | POA: Diagnosis not present

## 2016-09-27 DIAGNOSIS — Z79899 Other long term (current) drug therapy: Secondary | ICD-10-CM | POA: Diagnosis not present

## 2016-09-27 DIAGNOSIS — L409 Psoriasis, unspecified: Secondary | ICD-10-CM | POA: Diagnosis not present

## 2016-11-06 ENCOUNTER — Other Ambulatory Visit: Payer: Self-pay | Admitting: Unknown Physician Specialty

## 2016-11-16 ENCOUNTER — Ambulatory Visit: Payer: Medicare Other | Admitting: General Surgery

## 2016-11-22 ENCOUNTER — Ambulatory Visit (INDEPENDENT_AMBULATORY_CARE_PROVIDER_SITE_OTHER): Payer: Medicare Other | Admitting: General Surgery

## 2016-11-22 ENCOUNTER — Encounter: Payer: Self-pay | Admitting: General Surgery

## 2016-11-22 VITALS — BP 138/70 | HR 71 | Resp 14 | Ht 65.5 in | Wt 236.0 lb

## 2016-11-22 DIAGNOSIS — D242 Benign neoplasm of left breast: Secondary | ICD-10-CM | POA: Diagnosis not present

## 2016-11-22 NOTE — Patient Instructions (Addendum)
Call us to let us know you had your mammograms completed in December so Dr Bary Castilla may look at them.  Follow up as needed.  May use heating pad for chest wall discomfort.
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
## 2016-12-24 ENCOUNTER — Ambulatory Visit (INDEPENDENT_AMBULATORY_CARE_PROVIDER_SITE_OTHER): Payer: Medicare Other | Admitting: Unknown Physician Specialty

## 2016-12-24 ENCOUNTER — Encounter: Payer: Self-pay | Admitting: Unknown Physician Specialty

## 2016-12-24 VITALS — BP 126/74 | HR 75 | Temp 98.5°F | Wt 238.6 lb

## 2016-12-24 DIAGNOSIS — R350 Frequency of micturition: Secondary | ICD-10-CM

## 2016-12-24 DIAGNOSIS — R61 Generalized hyperhidrosis: Secondary | ICD-10-CM | POA: Diagnosis not present

## 2016-12-24 DIAGNOSIS — G8929 Other chronic pain: Secondary | ICD-10-CM | POA: Diagnosis not present

## 2016-12-24 DIAGNOSIS — M25579 Pain in unspecified ankle and joints of unspecified foot: Secondary | ICD-10-CM

## 2016-12-24 DIAGNOSIS — M25572 Pain in left ankle and joints of left foot: Secondary | ICD-10-CM

## 2016-12-24 LAB — UA/M W/RFLX CULTURE, ROUTINE
BILIRUBIN UA: NEGATIVE
GLUCOSE, UA: NEGATIVE
Ketones, UA: NEGATIVE
Leukocytes, UA: NEGATIVE
NITRITE UA: NEGATIVE
PH UA: 6.5 (ref 5.0–7.5)
Protein, UA: NEGATIVE
Specific Gravity, UA: 1.015 (ref 1.005–1.030)
UUROB: 0.2 mg/dL (ref 0.2–1.0)

## 2016-12-24 LAB — MICROSCOPIC EXAMINATION: Bacteria, UA: NONE SEEN

## 2016-12-24 LAB — GLUCOSE HEMOCUE WAIVED: GLU HEMOCUE WAIVED: 77 mg/dL (ref 65–99)

## 2016-12-24 NOTE — Progress Notes (Signed)
BP 126/74 (BP Location: Left Arm, Cuff Size: Large)   Pulse 75   Temp 98.5 F (36.9 C)   Wt 238 lb 9.6 oz (108.2 kg)   LMP  (LMP Unknown)   SpO2 98%   BMI 39.10 kg/m    Subjective:    Patient ID: Angela Watts, female    DOB: [birth year], 66 y.o.   MRN: [id number]  HPI: Angela Watts is a 66 y.o. female  Chief Complaint  Patient presents with  . Edema    pt states she has been having left foot swelling and weakness on the left side of her body off and on for a few weeks    Pt states her left foot has been swelling at the end of the day.  It's not swelling today but just noticing when she is going to bed but in the AM it is better.  Feels a little weaker.  Having night sweats and peeing all the time.  Some SOB and fatigue.  Reviewed labs from May and all WNL except for Alk phos elevation.  Hgb A1C and blood sugar not elevated.  Hx significant for pins in bilateral ankles  Relevant past medical, surgical, family and social history reviewed and updated as indicated. Interim medical history since our last visit reviewed. Allergies and medications reviewed and updated.  Review of Systems  Per HPI unless specifically indicated above     Objective:    BP 126/74 (BP Location: Left Arm, Cuff Size: Large)   Pulse 75   Temp 98.5 F (36.9 C)   Wt 238 lb 9.6 oz (108.2 kg)   LMP  (LMP Unknown)   SpO2 98%   BMI 39.10 kg/m   Wt Readings from Last 3 Encounters:  12/24/16 238 lb 9.6 oz (108.2 kg)  11/22/16 236 lb (107 kg)  09/22/16 234 lb 12.8 oz (106.5 kg)    Physical Exam  Constitutional: She is oriented to person, place, and time. She appears well-developed and well-nourished. No distress.  HENT:  Head: Normocephalic and atraumatic.  Eyes: Conjunctivae and lids are normal. Right eye exhibits no discharge. Left eye exhibits no discharge. No scleral icterus.  Neck: Normal range of motion. Neck supple. No JVD present. Carotid bruit is not present.  Cardiovascular: Normal  rate, regular rhythm and normal heart sounds.   Pulmonary/Chest: Effort normal and breath sounds normal.  Abdominal: Normal appearance. There is no splenomegaly or hepatomegaly.  Musculoskeletal: Normal range of motion.       Left ankle: She exhibits normal range of motion.  Mild lateral ankle swelling  Neurological: She is alert and oriented to person, place, and time.  Skin: Skin is warm, dry and intact. No rash noted. No pallor.  Psychiatric: She has a normal mood and affect. Her behavior is normal. Judgment and thought content normal.   Note: just started Methotrexate.    Results for orders placed or performed in visit on 03/19/16  Comprehensive metabolic panel  Result Value Ref Range   Glucose 92 65 - 99 mg/dL   BUN 10 8 - 27 mg/dL   Creatinine, Ser 0.88 0.57 - 1.00 mg/dL   GFR calc non Af Amer 68 >59 mL/min/1.73   GFR calc Af Amer 79 >59 mL/min/1.73   BUN/Creatinine Ratio 11 (L) 12 - 28   Sodium 140 134 - 144 mmol/L   Potassium 4.5 3.5 - 5.2 mmol/L   Chloride 102 96 - 106 mmol/L   CO2 24 18 - 29 mmol/L  Calcium 9.3 8.7 - 10.3 mg/dL   Total Protein 7.3 6.0 - 8.5 g/dL   Albumin 3.9 3.6 - 4.8 g/dL   Globulin, Total 3.4 1.5 - 4.5 g/dL   Albumin/Globulin Ratio 1.1 (L) 1.2 - 2.2   Bilirubin Total 0.6 0.0 - 1.2 mg/dL   Alkaline Phosphatase 179 (H) 39 - 117 IU/L   AST 21 0 - 40 IU/L   ALT 32 0 - 32 IU/L  CBC with Differential/Platelet  Result Value Ref Range   WBC 4.2 3.4 - 10.8 x10E3/uL   RBC 4.49 3.77 - 5.28 x10E6/uL   Hemoglobin 13.1 11.1 - 15.9 g/dL   Hematocrit 38.1 34.0 - 46.6 %   MCV 85 79 - 97 fL   MCH 29.2 26.6 - 33.0 pg   MCHC 34.4 31.5 - 35.7 g/dL   RDW 14.1 12.3 - 15.4 %   Platelets 303 150 - 379 x10E3/uL   Neutrophils 38 Not Estab. %   Lymphs 47 Not Estab. %   Monocytes 10 Not Estab. %   Eos 4 Not Estab. %   Basos 1 Not Estab. %   Neutrophils Absolute 1.6 1.4 - 7.0 x10E3/uL   Lymphocytes Absolute 2.0 0.7 - 3.1 x10E3/uL   Monocytes Absolute 0.4 0.1 - 0.9  x10E3/uL   EOS (ABSOLUTE) 0.2 0.0 - 0.4 x10E3/uL   Basophils Absolute 0.0 0.0 - 0.2 x10E3/uL   Immature Granulocytes 0 Not Estab. %   Immature Grans (Abs) 0.0 0.0 - 0.1 x10E3/uL  Lipid Panel w/o Chol/HDL Ratio  Result Value Ref Range   Cholesterol, Total 160 100 - 199 mg/dL   Triglycerides 56 0 - 149 mg/dL   HDL 52 >39 mg/dL   VLDL Cholesterol Cal 11 5 - 40 mg/dL   LDL Calculated 97 0 - 99 mg/dL  TSH  Result Value Ref Range   TSH 1.630 0.450 - 4.500 uIU/mL      Assessment & Plan:   Problem List Items Addressed This Visit      Unprioritized   Ankle pain, chronic    S/p ankle surgery and pinning.  After examining ankle the swelling seems to be isolated to the medial ankle and not represent a systemic process.  Will refer to podiatry who did her iniitial surgery      Relevant Orders   Ambulatory referral to Podiatry    Other Visit Diagnoses    Night sweats    -  Primary   Suspect due to Methtrexate which she just restarted   Frequency of urination       urine is negative.  Glucose is 77 today   Relevant Orders   Glucose Hemocue Waived   UA/M w/rflx Culture, Routine       Follow up plan: Return for regular visit in November.

## 2016-12-24 NOTE — Assessment & Plan Note (Addendum)
S/p ankle surgery and pinning.  After examining ankle the swelling seems to be isolated to the medial ankle and not represent a systemic process.  Will refer to podiatry who did her iniitial surgery

## 2017-01-12 DIAGNOSIS — M19072 Primary osteoarthritis, left ankle and foot: Secondary | ICD-10-CM | POA: Diagnosis not present

## 2017-01-12 DIAGNOSIS — M25572 Pain in left ankle and joints of left foot: Secondary | ICD-10-CM | POA: Diagnosis not present

## 2017-01-21 ENCOUNTER — Telehealth: Payer: Self-pay | Admitting: Unknown Physician Specialty

## 2017-01-21 NOTE — Telephone Encounter (Signed)
Called pt to schedule for Annual Wellness Visit with Nurse Health Advisor, Tiffany Hill, my c/b # is [phone]  Kathryn Brown ° °

## 2017-01-26 DIAGNOSIS — M19072 Primary osteoarthritis, left ankle and foot: Secondary | ICD-10-CM | POA: Diagnosis not present

## 2017-02-03 DIAGNOSIS — M170 Bilateral primary osteoarthritis of knee: Secondary | ICD-10-CM | POA: Diagnosis not present

## 2017-02-03 DIAGNOSIS — M2140 Flat foot [pes planus] (acquired), unspecified foot: Secondary | ICD-10-CM | POA: Insufficient documentation

## 2017-02-03 DIAGNOSIS — L409 Psoriasis, unspecified: Secondary | ICD-10-CM | POA: Diagnosis not present

## 2017-02-03 DIAGNOSIS — M059 Rheumatoid arthritis with rheumatoid factor, unspecified: Secondary | ICD-10-CM | POA: Diagnosis not present

## 2017-02-03 DIAGNOSIS — Z79899 Other long term (current) drug therapy: Secondary | ICD-10-CM | POA: Diagnosis not present

## 2017-02-04 ENCOUNTER — Other Ambulatory Visit: Payer: Self-pay | Admitting: Unknown Physician Specialty

## 2017-03-21 ENCOUNTER — Ambulatory Visit (INDEPENDENT_AMBULATORY_CARE_PROVIDER_SITE_OTHER): Payer: Medicare Other

## 2017-03-21 VITALS — BP 130/68 | HR 74 | Temp 97.5°F | Resp 16 | Ht 66.0 in | Wt 240.4 lb

## 2017-03-21 DIAGNOSIS — Z0000 Encounter for general adult medical examination without abnormal findings: Secondary | ICD-10-CM

## 2017-03-21 DIAGNOSIS — Z23 Encounter for immunization: Secondary | ICD-10-CM | POA: Diagnosis not present

## 2017-03-21 NOTE — Patient Instructions (Signed)
Angela Watts , Thank you for taking time to come for your Medicare Wellness Visit. I appreciate your ongoing commitment to your health goals. Please review the following plan we discussed and let me know if I can assist you in the future.   Screening recommendations/referrals: Colonoscopy: completed 06/11/2015 Mammogram: completed 05/24/2016 Bone Density: completed 05/05/2016 Recommended yearly ophthalmology/optometry visit for glaucoma screening and checkup Recommended yearly dental visit for hygiene and checkup  Vaccinations: Influenza vaccine: done today  Pneumococcal vaccine: up to date Tdap vaccine:up to date Shingles vaccine: up to date  Advanced directives: Please bring a copy of your health care power of attorney and living will to the office at your convenience.  Conditions/risks identified: none  Next appointment: Follow up on 03/22/2017 at 10:00am with Angela Watts. Follow up in one year for your annual wellness exam.    Preventive Care 65 Years and Older, Female Preventive care refers to lifestyle choices and visits with your health care provider that can promote health and wellness. What does preventive care include?  A yearly physical exam. This is also called an annual well check.  Dental exams once or twice a year.  Routine eye exams. Ask your health care provider how often you should have your eyes checked.  Personal lifestyle choices, including:  Daily care of your teeth and gums.  Regular physical activity.  Eating a healthy diet.  Avoiding tobacco and drug use.  Limiting alcohol use.  Practicing safe sex.  Taking low-dose aspirin every day.  Taking vitamin and mineral supplements as recommended by your health care provider. What happens during an annual well check? The services and screenings done by your health care provider during your annual well check will depend on your age, overall health, lifestyle risk factors, and family history of  disease. Counseling  Your health care provider may ask you questions about your:  Alcohol use.  Tobacco use.  Drug use.  Emotional well-being.  Home and relationship well-being.  Sexual activity.  Eating habits.  History of falls.  Memory and ability to understand (cognition).  Work and work Statistician.  Reproductive health. Screening  You may have the following tests or measurements:  Height, weight, and BMI.  Blood pressure.  Lipid and cholesterol levels. These may be checked every 5 years, or more frequently if you are over 58 years old.  Skin check.  Lung cancer screening. You may have this screening every year starting at age 40 if you have a 30-pack-year history of smoking and currently smoke or have quit within the past 15 years.  Fecal occult blood test (FOBT) of the stool. You may have this test every year starting at age 74.  Flexible sigmoidoscopy or colonoscopy. You may have a sigmoidoscopy every 5 years or a colonoscopy every 10 years starting at age 87.  Hepatitis C blood test.  Hepatitis B blood test.  Sexually transmitted disease (STD) testing.  Diabetes screening. This is done by checking your blood sugar (glucose) after you have not eaten for a while (fasting). You may have this done every 1-3 years.  Bone density scan. This is done to screen for osteoporosis. You may have this done starting at age 70.  Mammogram. This may be done every 1-2 years. Talk to your health care provider about how often you should have regular mammograms. Talk with your health care provider about your test results, treatment options, and if necessary, the need for more tests. Vaccines  Your health care provider may recommend certain  vaccines, such as:  Influenza vaccine. This is recommended every year.  Tetanus, diphtheria, and acellular pertussis (Tdap, Td) vaccine. You may need a Td booster every 10 years.  Zoster vaccine. You may need this after age  59.  Pneumococcal 13-valent conjugate (PCV13) vaccine. One dose is recommended after age 59.  Pneumococcal polysaccharide (PPSV23) vaccine. One dose is recommended after age 21. Talk to your health care provider about which screenings and vaccines you need and how often you need them. This information is not intended to replace advice given to you by your health care provider. Make sure you discuss any questions you have with your health care provider. Document Released: 05/30/2015 Document Revised: 01/21/2016 Document Reviewed: 03/04/2015 Elsevier Interactive Patient Education  2017 [place] Prevention in the Home Falls can cause injuries. They can happen to people of all ages. There are many things you can do to make your home safe and to help prevent falls. What can I do on the outside of my home?  Regularly fix the edges of walkways and driveways and fix any cracks.  Remove anything that might make you trip as you walk through a door, such as a raised step or threshold.  Trim any bushes or trees on the path to your home.  Use bright outdoor lighting.  Clear any walking paths of anything that might make someone trip, such as rocks or tools.  Regularly check to see if handrails are loose or broken. Make sure that both sides of any steps have handrails.  Any raised decks and porches should have guardrails on the edges.  Have any leaves, snow, or ice cleared regularly.  Use sand or salt on walking paths during winter.  Clean up any spills in your garage right away. This includes oil or grease spills. What can I do in the bathroom?  Use night lights.  Install grab bars by the toilet and in the tub and shower. Do not use towel bars as grab bars.  Use non-skid mats or decals in the tub or shower.  If you need to sit down in the shower, use a plastic, non-slip stool.  Keep the floor dry. Clean up any water that spills on the floor as soon as it happens.  Remove  soap buildup in the tub or shower regularly.  Attach bath mats securely with double-sided non-slip rug tape.  Do not have throw rugs and other things on the floor that can make you trip. What can I do in the bedroom?  Use night lights.  Make sure that you have a light by your bed that is easy to reach.  Do not use any sheets or blankets that are too big for your bed. They should not hang down onto the floor.  Have a firm chair that has side arms. You can use this for support while you get dressed.  Do not have throw rugs and other things on the floor that can make you trip. What can I do in the kitchen?  Clean up any spills right away.  Avoid walking on wet floors.  Keep items that you use a lot in easy-to-reach places.  If you need to reach something above you, use a strong step stool that has a grab bar.  Keep electrical cords out of the way.  Do not use floor polish or wax that makes floors slippery. If you must use wax, use non-skid floor wax.  Do not have throw rugs and other things  on the floor that can make you trip. What can I do with my stairs?  Do not leave any items on the stairs.  Make sure that there are handrails on both sides of the stairs and use them. Fix handrails that are broken or loose. Make sure that handrails are as long as the stairways.  Check any carpeting to make sure that it is firmly attached to the stairs. Fix any carpet that is loose or worn.  Avoid having throw rugs at the top or bottom of the stairs. If you do have throw rugs, attach them to the floor with carpet tape.  Make sure that you have a light switch at the top of the stairs and the bottom of the stairs. If you do not have them, ask someone to add them for you. What else can I do to help prevent falls?  Wear shoes that:  Do not have high heels.  Have rubber bottoms.  Are comfortable and fit you well.  Are closed at the toe. Do not wear sandals.  If you use a  stepladder:  Make sure that it is fully opened. Do not climb a closed stepladder.  Make sure that both sides of the stepladder are locked into place.  Ask someone to hold it for you, if possible.  Clearly mark and make sure that you can see:  Any grab bars or handrails.  First and last steps.  Where the edge of each step is.  Use tools that help you move around (mobility aids) if they are needed. These include:  Canes.  Walkers.  Scooters.  Crutches.  Turn on the lights when you go into a dark area. Replace any light bulbs as soon as they burn out.  Set up your furniture so you have a clear path. Avoid moving your furniture around.  If any of your floors are uneven, fix them.  If there are any pets around you, be aware of where they are.  Review your medicines with your doctor. Some medicines can make you feel dizzy. This can increase your chance of falling. Ask your doctor what other things that you can do to help prevent falls. This information is not intended to replace advice given to you by your health care provider. Make sure you discuss any questions you have with your health care provider. Document Released: 02/27/2009 Document Revised: 10/09/2015 Document Reviewed: 06/07/2014 Elsevier Interactive Patient Education  2017 [employer].

## 2017-03-21 NOTE — Progress Notes (Signed)
Subjective:   Angela Watts is a 66 y.o. female who presents for Medicare Annual (Subsequent) preventive examination.  Review of Systems:  Cardiac Risk Factors include: hypertension;advanced age (>32men, >36 women);dyslipidemia;obesity (BMI >30kg/m2)     Objective:     Vitals: BP 130/68 (BP Location: Left Arm, Patient Position: Sitting)   Pulse 74   Temp (!) 97.5 F (36.4 C) (Temporal)   Resp 16   Ht 5\' 6"  (1.676 m)   Wt 240 lb 6.4 oz (109 kg)   LMP  (LMP Unknown)   BMI 38.80 kg/m   Body mass index is 38.8 kg/m.   Tobacco Social History   Tobacco Use  Smoking Status Never Smoker  Smokeless Tobacco Never Used     Counseling given: Not Answered   Past Medical History:  Diagnosis Date  . Abnormal Pap smear of cervix   . Arthritis   . GERD (gastroesophageal reflux disease)   . Hyperlipidemia   . Hypertension    Past Surgical History:  Procedure Laterality Date  . ABDOMINAL HYSTERECTOMY  2003  . BREAST CYST ASPIRATION Left 2011   NEG/ Br [place]  . CYST EXCISION Left    breast   Family History  Problem Relation Age of Onset  . Hypertension Mother   . Arthritis Mother   . Breast cancer Mother   . Hypertension Father   . Lung cancer Father   . Diabetes Sister   . Hypertension Sister   . Hypertension Brother   . Hypertension Sister   . Hypertension Sister   . Hypertension Sister   . Hypertension Brother   . Cancer Brother        lung   Social History   Substance and Sexual Activity  Sexual Activity No    Outpatient Encounter Medications as of 03/21/2017  Medication Sig  . amLODipine (NORVASC) 5 MG tablet Take 1 tablet (5 mg total) by mouth daily.  Marland Kitchen aspirin 81 MG tablet Take 81 mg by mouth daily.  Marland Kitchen atorvastatin (LIPITOR) 20 MG tablet TAKE 1 TABLET BY MOUTH ONCE DAILY  . Ca Phosphate-Cholecalciferol (CALTRATE GUMMY BITES) 250-400 MG-UNIT CHEW Chew by mouth daily.  . folic acid (FOLVITE) 1 MG tablet Take 1 mg by mouth daily.  . methotrexate  (RHEUMATREX) 2.5 MG tablet Take 4 tabs once a week for 12 weeks  . montelukast (SINGULAIR) 10 MG tablet Take 1 tablet (10 mg total) by mouth at bedtime.  . Multiple Vitamin (MULTIVITAMIN) tablet Take 1 tablet by mouth daily.  . clotrimazole-betamethasone (LOTRISONE) cream Apply 1 application topically 2 (two) times daily. (Patient not taking: Reported on 12/24/2016)   No facility-administered encounter medications on file as of 03/21/2017.     Activities of Daily Living In your present state of health, do you have any difficulty performing the following activities: 03/21/2017  Hearing? N  Vision? N  Difficulty concentrating or making decisions? Y  Walking or climbing stairs? N  Dressing or bathing? N  Doing errands, shopping? N  Preparing Food and eating ? N  Using the Toilet? N  In the past six months, have you accidently leaked urine? N  Do you have problems with loss of bowel control? N  Managing your Medications? N  Managing your Finances? N  Housekeeping or managing your Housekeeping? N  Some recent data might be hidden    Patient Care Team: Kathrine Haddock, NP as PCP - General (Nurse Practitioner) Valerie Roys, DO as Referring Physician (Family Medicine) [name],  MD (General Surgery) Marlowe Sax, MD as Referring Physician (Internal Medicine)    Assessment:     Exercise Activities and Dietary recommendations Current Exercise Habits: Home exercise routine, Type of exercise: walking, Time (Minutes): 40, Frequency (Times/Week): 3, Weekly Exercise (Minutes/Week): 120, Intensity: Mild  Goals    None     Fall Risk Fall Risk  03/21/2017 03/19/2016 03/18/2015  Falls in the past year? No No No   Depression Screen PHQ 2/9 Scores 03/21/2017 03/19/2016 03/18/2015  PHQ - 2 Score 1 0 0  PHQ- 9 Score 3 0 -     Cognitive Function     6CIT Screen 03/21/2017  What Year? 0 points  What month? 0 points  What time? 0 points  Count back from 20 0 points    Months in reverse 0 points  Repeat phrase 0 points  Total Score 0    Immunization History  Administered Date(s) Administered  . Influenza, High Dose Seasonal PF 03/19/2016, 03/21/2017  . Influenza,inj,Quad PF,6+ Mos 03/18/2015  . Pneumococcal Conjugate-13 02/11/2014  . Pneumococcal Polysaccharide-23 02/03/2010, 03/18/2015  . Td 03/19/2004  . Tdap 03/18/2015  . Zoster 02/20/2013   Screening Tests Health Maintenance  Topic Date Due  . MAMMOGRAM  05/05/2018  . TETANUS/TDAP  03/17/2025  . COLONOSCOPY  06/10/2025  . INFLUENZA VACCINE  Completed  . DEXA SCAN  Completed  . Hepatitis C Screening  Completed  . PNA vac Low Risk Adult  Completed      Plan:     I have personally reviewed and addressed the Medicare Annual Wellness questionnaire and have noted the following in the patient's chart:  A. Medical and social history B. Use of alcohol, tobacco or illicit drugs  C. Current medications and supplements D. Functional ability and status E.  Nutritional status F.  Physical activity G. Advance directives H. List of other physicians I.  Hospitalizations, surgeries, and ER visits in previous 12 months J.  [place] such as hearing and vision if needed, cognitive and depression L. Referrals and appointments - none  In addition, I have reviewed and discussed with patient certain preventive protocols, quality metrics, and best practice recommendations. A written personalized care plan for preventive services as well as general preventive health recommendations were provided to patient.   Signed,  Tyler Aas, LPN Nurse Health Advisor   MD Recommendations: none

## 2017-03-22 ENCOUNTER — Encounter: Payer: Self-pay | Admitting: Unknown Physician Specialty

## 2017-03-22 ENCOUNTER — Ambulatory Visit (INDEPENDENT_AMBULATORY_CARE_PROVIDER_SITE_OTHER): Payer: Medicare Other | Admitting: Unknown Physician Specialty

## 2017-03-22 VITALS — BP 136/70 | HR 85 | Temp 98.7°F | Ht 66.0 in | Wt 238.6 lb

## 2017-03-22 DIAGNOSIS — E7800 Pure hypercholesterolemia, unspecified: Secondary | ICD-10-CM

## 2017-03-22 DIAGNOSIS — I10 Essential (primary) hypertension: Secondary | ICD-10-CM | POA: Diagnosis not present

## 2017-03-22 DIAGNOSIS — R748 Abnormal levels of other serum enzymes: Secondary | ICD-10-CM | POA: Diagnosis not present

## 2017-03-22 DIAGNOSIS — Z0000 Encounter for general adult medical examination without abnormal findings: Secondary | ICD-10-CM | POA: Diagnosis not present

## 2017-03-22 NOTE — Assessment & Plan Note (Signed)
Check lipid panel today 

## 2017-03-22 NOTE — Progress Notes (Signed)
BP 136/70   Pulse 85   Temp 98.7 F (37.1 C) (Oral)   Ht 5\' 6"  (1.676 m)   Wt 238 lb 9.6 oz (108.2 kg)   LMP  (LMP Unknown)   SpO2 95%   BMI 38.51 kg/m    Subjective:    Patient ID: Angela Watts, female    DOB: 1950-10-21, 66 y.o.   MRN: [id number]  HPI: Angela Watts is a 66 y.o. female  Chief Complaint  Patient presents with  . Annual Exam    pt had wellness exam yesterday with [facility]   Hypertension Using medications without difficulty Average home BPs Not checking   No problems or lightheadedness No chest pain with exertion or shortness of breath No Edema   Hyperlipidemia Using medications without problems: No Muscle aches  Diet compliance:Exercise: "terrible"  Taking caring of her mom  Rheumatoid arthritis Seeing a rheumatologist            Reviewed [facility] notes.                Relevant past medical, surgical, family and social history reviewed and updated as indicated. Interim medical history since our last visit reviewed. Allergies and medications reviewed and updated.  Review of Systems  Per HPI unless specifically indicated above     Objective:    BP 136/70   Pulse 85   Temp 98.7 F (37.1 C) (Oral)   Ht 5\' 6"  (1.676 m)   Wt 238 lb 9.6 oz (108.2 kg)   LMP  (LMP Unknown)   SpO2 95%   BMI 38.51 kg/m   Wt Readings from Last 3 Encounters:  03/22/17 238 lb 9.6 oz (108.2 kg)  03/21/17 240 lb 6.4 oz (109 kg)  12/24/16 238 lb 9.6 oz (108.2 kg)    Physical Exam  Constitutional: She is oriented to person, place, and time. She appears well-developed and well-nourished.  HENT:  Head: Normocephalic and atraumatic.  Eyes: Pupils are equal, round, and reactive to light. Right eye exhibits no discharge. Left eye exhibits no discharge. No scleral icterus.  Neck: Normal range of motion. Neck supple. Carotid bruit is not present. No thyromegaly present.  Cardiovascular: Normal rate, regular rhythm and normal heart sounds. Exam reveals no gallop and no  friction rub.  No murmur heard. Pulmonary/Chest: Effort normal and breath sounds normal. No respiratory distress. She has no wheezes. She has no rales.  Abdominal: Soft. Bowel sounds are normal. There is no tenderness. There is no rebound.  Genitourinary: No breast swelling, tenderness or discharge.  Musculoskeletal: Normal range of motion.  Lymphadenopathy:    She has no cervical adenopathy.  Neurological: She is alert and oriented to person, place, and time.  Skin: Skin is warm, dry and intact. No rash noted.  Psychiatric: She has a normal mood and affect. Her speech is normal and behavior is normal. Judgment and thought content normal. Cognition and memory are normal.    Results for orders placed or performed in visit on 12/24/16  Microscopic Examination  Result Value Ref Range   WBC, UA 0-5 0 - 5 /hpf   RBC, UA 0-2 0 - 2 /hpf   Epithelial Cells (non renal) 0-10 0 - 10 /hpf   Bacteria, UA None seen None seen/Few  Glucose Hemocue Waived  Result Value Ref Range   Glu Hemocue Waived 77 65 - 99 mg/dL  UA/M w/rflx Culture, Routine  Result Value Ref Range   Specific Gravity, UA 1.015 1.005 - 1.030  pH, UA 6.5 5.0 - 7.5   Color, UA Yellow Yellow   Appearance Ur Clear Clear   Leukocytes, UA Negative Negative   Protein, UA Negative Negative/Trace   Glucose, UA Negative Negative   Ketones, UA Negative Negative   RBC, UA Trace (A) Negative   Bilirubin, UA Negative Negative   Urobilinogen, Ur 0.2 0.2 - 1.0 mg/dL   Nitrite, UA Negative Negative   Microscopic Examination See below:       Assessment & Plan:   Problem List Items Addressed This Visit      Unprioritized   Elevated alkaline phosphatase level    Check today      Hyperlipidemia    Check lipid panel today      Relevant Orders   Lipid Panel w/o Chol/HDL Ratio   Hypertension    Stable, continue present medications.        Relevant Orders   Comprehensive metabolic panel    Other Visit Diagnoses    Annual  physical exam    -  Primary       Follow up plan: Return in about 6 months (around 09/19/2017).

## 2017-03-22 NOTE — Assessment & Plan Note (Signed)
Stable, continue present medications.   

## 2017-03-22 NOTE — Assessment & Plan Note (Signed)
Check today 

## 2017-03-23 ENCOUNTER — Encounter: Payer: Self-pay | Admitting: Unknown Physician Specialty

## 2017-03-23 LAB — COMPREHENSIVE METABOLIC PANEL
A/G RATIO: 1.4 (ref 1.2–2.2)
ALK PHOS: 170 IU/L — AB (ref 39–117)
ALT: 21 IU/L (ref 0–32)
AST: 19 IU/L (ref 0–40)
Albumin: 3.9 g/dL (ref 3.6–4.8)
BUN/Creatinine Ratio: 17 (ref 12–28)
BUN: 15 mg/dL (ref 8–27)
Bilirubin Total: 0.5 mg/dL (ref 0.0–1.2)
CALCIUM: 9.1 mg/dL (ref 8.7–10.3)
CO2: 24 mmol/L (ref 20–29)
Chloride: 105 mmol/L (ref 96–106)
Creatinine, Ser: 0.88 mg/dL (ref 0.57–1.00)
GFR calc Af Amer: 78 mL/min/{1.73_m2} (ref >59)
GFR, EST NON AFRICAN AMERICAN: 68 mL/min/{1.73_m2} (ref >59)
GLOBULIN, TOTAL: 2.7 g/dL (ref 1.5–4.5)
Glucose: 88 mg/dL (ref 65–99)
POTASSIUM: 4.1 mmol/L (ref 3.5–5.2)
SODIUM: 142 mmol/L (ref 134–144)
Total Protein: 6.6 g/dL (ref 6.0–8.5)

## 2017-03-23 LAB — LIPID PANEL W/O CHOL/HDL RATIO
CHOLESTEROL TOTAL: 154 mg/dL (ref 100–199)
HDL: 52 mg/dL (ref >39)
LDL Calculated: 93 mg/dL (ref 0–99)
TRIGLYCERIDES: 43 mg/dL (ref 0–149)
VLDL Cholesterol Cal: 9 mg/dL (ref 5–40)

## 2017-04-21 DIAGNOSIS — H2513 Age-related nuclear cataract, bilateral: Secondary | ICD-10-CM | POA: Diagnosis not present

## 2017-05-06 ENCOUNTER — Other Ambulatory Visit: Payer: Self-pay | Admitting: Unknown Physician Specialty

## 2017-06-06 DIAGNOSIS — M059 Rheumatoid arthritis with rheumatoid factor, unspecified: Secondary | ICD-10-CM | POA: Diagnosis not present

## 2017-06-06 DIAGNOSIS — Z79899 Other long term (current) drug therapy: Secondary | ICD-10-CM | POA: Diagnosis not present

## 2017-06-06 DIAGNOSIS — M170 Bilateral primary osteoarthritis of knee: Secondary | ICD-10-CM | POA: Diagnosis not present

## 2017-08-03 ENCOUNTER — Other Ambulatory Visit: Payer: Self-pay | Admitting: Unknown Physician Specialty

## 2017-08-12 ENCOUNTER — Ambulatory Visit (INDEPENDENT_AMBULATORY_CARE_PROVIDER_SITE_OTHER): Payer: Medicare Other | Admitting: Unknown Physician Specialty

## 2017-08-12 ENCOUNTER — Encounter: Payer: Self-pay | Admitting: Unknown Physician Specialty

## 2017-08-12 VITALS — BP 124/76 | HR 75 | Temp 98.3°F | Ht 66.0 in | Wt 240.6 lb

## 2017-08-12 DIAGNOSIS — G4762 Sleep related leg cramps: Secondary | ICD-10-CM

## 2017-08-12 DIAGNOSIS — J029 Acute pharyngitis, unspecified: Secondary | ICD-10-CM

## 2017-08-12 NOTE — Patient Instructions (Addendum)
Magnesium supplement at bedtime.  Magnesium Citrate I like the best.    -------------------------------------------------------------   Upper Respiratory Infection, Adult Most upper respiratory infections (URIs) are a viral infection of the air passages leading to the lungs. A URI affects the nose, throat, and upper air passages. The most common type of URI is nasopharyngitis and is typically referred to as "the common cold." URIs run their course and usually go away on their own. Most of the time, a URI does not require medical attention, but sometimes a bacterial infection in the upper airways can follow a viral infection. This is called a secondary infection. Sinus and middle ear infections are common types of secondary upper respiratory infections. Bacterial pneumonia can also complicate a URI. A URI can worsen asthma and chronic obstructive pulmonary disease (COPD). Sometimes, these complications can require emergency medical care and may be life threatening. What are the causes? Almost all URIs are caused by viruses. A virus is a type of germ and can spread from one person to another. What increases the risk? You may be at risk for a URI if:  You smoke.  You have chronic heart or lung disease.  You have a weakened defense (immune) system.  You are very young or very old.  You have nasal allergies or asthma.  You work in crowded or poorly ventilated areas.  You work in health care facilities or schools.  What are the signs or symptoms? Symptoms typically develop 2-3 days after you come in contact with a cold virus. Most viral URIs last 7-10 days. However, viral URIs from the influenza virus (flu virus) can last 14-18 days and are typically more severe. Symptoms may include:  Runny or stuffy (congested) nose.  Sneezing.  Cough.  Sore throat.  Headache.  Fatigue.  Fever.  Loss of appetite.  Pain in your forehead, behind your eyes, and over your cheekbones (sinus  pain).  Muscle aches.  How is this diagnosed? Your health care provider may diagnose a URI by:  Physical exam.  Tests to check that your symptoms are not due to another condition such as: ? Strep throat. ? Sinusitis. ? Pneumonia. ? Asthma.  How is this treated? A URI goes away on its own with time. It cannot be cured with medicines, but medicines may be prescribed or recommended to relieve symptoms. Medicines may help:  Reduce your fever.  Reduce your cough.  Relieve nasal congestion.  Follow these instructions at home:  Take medicines only as directed by your health care provider.  Gargle warm saltwater or take cough drops to comfort your throat as directed by your health care provider.  Use a warm mist humidifier or inhale steam from a shower to increase air moisture. This may make it easier to breathe.  Drink enough fluid to keep your urine clear or pale yellow.  Eat soups and other clear broths and maintain good nutrition.  Rest as needed.  Return to work when your temperature has returned to normal or as your health care provider advises. You may need to stay home longer to avoid infecting others. You can also use a face mask and careful hand washing to prevent spread of the virus.  Increase the usage of your inhaler if you have asthma.  Do not use any tobacco products, including cigarettes, chewing tobacco, or electronic cigarettes. If you need help quitting, ask your health care provider. How is this prevented? The best way to protect yourself from getting a cold is to  practice good hygiene.  Avoid oral or hand contact with people with cold symptoms.  Wash your hands often if contact occurs.  There is no clear evidence that vitamin C, vitamin E, echinacea, or exercise reduces the chance of developing a cold. However, it is always recommended to get plenty of rest, exercise, and practice good nutrition. Contact a health care provider if:  You are getting  worse rather than better.  Your symptoms are not controlled by medicine.  You have chills.  You have worsening shortness of breath.  You have brown or red mucus.  You have yellow or brown nasal discharge.  You have pain in your face, especially when you bend forward.  You have a fever.  You have swollen neck glands.  You have pain while swallowing.  You have white areas in the back of your throat. Get help right away if:  You have severe or persistent: ? Headache. ? Ear pain. ? Sinus pain. ? Chest pain.  You have chronic lung disease and any of the following: ? Wheezing. ? Prolonged cough. ? Coughing up blood. ? A change in your usual mucus.  You have a stiff neck.  You have changes in your: ? Vision. ? Hearing. ? Thinking. ? Mood. This information is not intended to replace advice given to you by your health care provider. Make sure you discuss any questions you have with your health care provider. Document Released: 10/27/2000 Document Revised: 01/04/2016 Document Reviewed: 08/08/2013 Elsevier Interactive Patient Education  [employer].

## 2017-08-12 NOTE — Progress Notes (Signed)
BP 124/76   Pulse 75   Temp 98.3 F (36.8 C) (Oral)   Ht 5\' 6"  (1.676 m)   Wt 240 lb 9.6 oz (109.1 kg)   LMP  (LMP Unknown)   SpO2 98%   BMI 38.83 kg/m    Subjective:    Patient ID: Angela Watts, female    DOB: 09/23/1950, 67 y.o.   MRN: [id number]  HPI: Angela Watts is a 67 y.o. female  Chief Complaint  Patient presents with  . Sore Throat    pt states she has a sore throat that started yesterday  . leg cramps    pt states she has been having leg cramps off and on   Sore Throat   This is a new problem. The current episode started yesterday. The problem has been gradually worsening. Neither side of throat is experiencing more pain than the other. There has been no fever. Associated symptoms include headaches, a hoarse voice and trouble swallowing. Pertinent negatives include no abdominal pain, congestion, coughing, diarrhea, drooling, ear discharge, ear pain, plugged ear sensation, neck pain, shortness of breath, stridor, swollen glands or vomiting. She has tried nothing for the symptoms. The treatment provided no relief.   Leg Cramps Notices at night.  Sharp cramps the wake her up.   Relevant past medical, surgical, family and social history reviewed and updated as indicated. Interim medical history since our last visit reviewed. Allergies and medications reviewed and updated.  Review of Systems  HENT: Positive for hoarse voice and trouble swallowing. Negative for congestion, drooling, ear discharge and ear pain.   Respiratory: Negative for cough, shortness of breath and stridor.   Gastrointestinal: Negative for abdominal pain, diarrhea and vomiting.  Musculoskeletal: Negative for neck pain.  Neurological: Positive for headaches.    Per HPI unless specifically indicated above     Objective:    BP 124/76   Pulse 75   Temp 98.3 F (36.8 C) (Oral)   Ht 5\' 6"  (1.676 m)   Wt 240 lb 9.6 oz (109.1 kg)   LMP  (LMP Unknown)   SpO2 98%   BMI 38.83 kg/m   Wt  Readings from Last 3 Encounters:  08/12/17 240 lb 9.6 oz (109.1 kg)  03/22/17 238 lb 9.6 oz (108.2 kg)  03/21/17 240 lb 6.4 oz (109 kg)    Physical Exam  Constitutional: She is oriented to person, place, and time. She appears well-developed and well-nourished. No distress.  HENT:  Head: Normocephalic and atraumatic.  Eyes: Conjunctivae and lids are normal. Right eye exhibits no discharge. Left eye exhibits no discharge. No scleral icterus.  Neck: Normal range of motion. Neck supple. No JVD present. Carotid bruit is not present.  Cardiovascular: Normal rate, regular rhythm and normal heart sounds.  Pulmonary/Chest: Effort normal and breath sounds normal.  Abdominal: Normal appearance. There is no splenomegaly or hepatomegaly.  Musculoskeletal: Normal range of motion.  Neurological: She is alert and oriented to person, place, and time.  Skin: Skin is warm, dry and intact. No rash noted. No pallor.  Psychiatric: She has a normal mood and affect. Her behavior is normal. Judgment and thought content normal.    Results for orders placed or performed in visit on 03/22/17  Comprehensive metabolic panel  Result Value Ref Range   Glucose 88 65 - 99 mg/dL   BUN 15 8 - 27 mg/dL   Creatinine, Ser 0.88 0.57 - 1.00 mg/dL   GFR calc non Af Amer 68 >59 mL/min/1.73  GFR calc Af Amer 78 >59 mL/min/1.73   BUN/Creatinine Ratio 17 12 - 28   Sodium 142 134 - 144 mmol/L   Potassium 4.1 3.5 - 5.2 mmol/L   Chloride 105 96 - 106 mmol/L   CO2 24 20 - 29 mmol/L   Calcium 9.1 8.7 - 10.3 mg/dL   Total Protein 6.6 6.0 - 8.5 g/dL   Albumin 3.9 3.6 - 4.8 g/dL   Globulin, Total 2.7 1.5 - 4.5 g/dL   Albumin/Globulin Ratio 1.4 1.2 - 2.2   Bilirubin Total 0.5 0.0 - 1.2 mg/dL   Alkaline Phosphatase 170 (H) 39 - 117 IU/L   AST 19 0 - 40 IU/L   ALT 21 0 - 32 IU/L  Lipid Panel w/o Chol/HDL Ratio  Result Value Ref Range   Cholesterol, Total 154 100 - 199 mg/dL   Triglycerides 43 0 - 149 mg/dL   HDL 52 >39 mg/dL     VLDL Cholesterol Cal 9 5 - 40 mg/dL   LDL Calculated 93 0 - 99 mg/dL      Assessment & Plan:   Problem List Items Addressed This Visit    None    Visit Diagnoses    Sore throat    -  Primary   Strep negative.  Salt water gargles.  Tylenol for pain.  Pt ed on viral URI   Relevant Orders   Rapid Strep Screen (Not at [facility])   Nocturnal leg cramps       Recommended Magnesium QHS.  Pt ed given.         Follow up plan: Return if symptoms worsen or fail to improve.

## 2017-08-15 LAB — CULTURE, GROUP A STREP: Strep A Culture: NEGATIVE

## 2017-08-15 LAB — RAPID STREP SCREEN (MED CTR MEBANE ONLY): STREP GP A AG, IA W/REFLEX: NEGATIVE

## 2017-09-08 ENCOUNTER — Other Ambulatory Visit: Payer: Self-pay | Admitting: Unknown Physician Specialty

## 2017-09-20 ENCOUNTER — Encounter: Payer: Self-pay | Admitting: Unknown Physician Specialty

## 2017-09-20 ENCOUNTER — Ambulatory Visit (INDEPENDENT_AMBULATORY_CARE_PROVIDER_SITE_OTHER): Payer: Medicare Other | Admitting: Unknown Physician Specialty

## 2017-09-20 DIAGNOSIS — R252 Cramp and spasm: Secondary | ICD-10-CM | POA: Insufficient documentation

## 2017-09-20 DIAGNOSIS — G4762 Sleep related leg cramps: Secondary | ICD-10-CM

## 2017-09-20 DIAGNOSIS — E7800 Pure hypercholesterolemia, unspecified: Secondary | ICD-10-CM

## 2017-09-20 DIAGNOSIS — R748 Abnormal levels of other serum enzymes: Secondary | ICD-10-CM | POA: Diagnosis not present

## 2017-09-20 DIAGNOSIS — I10 Essential (primary) hypertension: Secondary | ICD-10-CM

## 2017-09-20 NOTE — Assessment & Plan Note (Signed)
Recommended a powdered form of Magnesium.

## 2017-09-20 NOTE — Assessment & Plan Note (Signed)
Not to goal.  DASH diet given.  Encouraged to eat better.  Add Magnesium for leg cramps which might help reduce BP

## 2017-09-20 NOTE — Progress Notes (Signed)
BP 140/75   Pulse 71   Temp 98 F (36.7 C) (Oral)   Ht 5' 6"  (1.676 m)   Wt 241 lb 3.2 oz (109.4 kg)   LMP  (LMP Unknown)   SpO2 96%   BMI 38.93 kg/m    Subjective:    Patient ID: Angela Watts, female    DOB: 12/25/1950, 67 y.o.   MRN: [id number]  HPI: Angela Watts is a 67 y.o. female  Chief Complaint  Patient presents with  . Hyperlipidemia  . Hypertension   Leg cramps Unable to take Magnesium due to size of pills.    Hypertension Using medications without difficulty Average home BPs Around SBP 135-140   No problems or lightheadedness No chest pain with exertion or shortness of breath No Edema  Hyperlipidemia Using medications without problems: Complaints of generalized soreness  Diet compliance:Exercise: She has been walking.  Admits diet could better.    Relevant past medical, surgical, family and social history reviewed and updated as indicated. Interim medical history since our last visit reviewed. Allergies and medications reviewed and updated.  Review of Systems  Per HPI unless specifically indicated above     Objective:    BP 140/75   Pulse 71   Temp 98 F (36.7 C) (Oral)   Ht 5' 6"  (1.676 m)   Wt 241 lb 3.2 oz (109.4 kg)   LMP  (LMP Unknown)   SpO2 96%   BMI 38.93 kg/m   Wt Readings from Last 3 Encounters:  09/20/17 241 lb 3.2 oz (109.4 kg)  08/12/17 240 lb 9.6 oz (109.1 kg)  03/22/17 238 lb 9.6 oz (108.2 kg)    Physical Exam  Constitutional: She is oriented to person, place, and time. She appears well-developed and well-nourished. No distress.  HENT:  Head: Normocephalic and atraumatic.  Eyes: Conjunctivae and lids are normal. Right eye exhibits no discharge. Left eye exhibits no discharge. No scleral icterus.  Neck: Normal range of motion. Neck supple. No JVD present. Carotid bruit is not present.  Cardiovascular: Normal rate, regular rhythm and normal heart sounds.  Pulmonary/Chest: Effort normal and breath sounds normal.    Abdominal: Normal appearance. There is no splenomegaly or hepatomegaly.  Musculoskeletal: Normal range of motion.  Neurological: She is alert and oriented to person, place, and time.  Skin: Skin is warm, dry and intact. No rash noted. No pallor.  Psychiatric: She has a normal mood and affect. Her behavior is normal. Judgment and thought content normal.    Results for orders placed or performed in visit on 08/12/17  Rapid Strep Screen (Not at [facility])  Result Value Ref Range   Strep Gp A Ag, IA W/Reflex Negative Negative  Culture, Group A Strep  Result Value Ref Range   Strep A Culture Negative       Assessment & Plan:   Problem List Items Addressed This Visit      Unprioritized   Elevated alkaline phosphatase level    Check CMP today.  Last Alk Phos improved      Relevant Orders   Comprehensive metabolic panel   Hyperlipidemia    Last LDL below 100.  Try a trial off of Atorvastatin for about 2 weeks and then restart to see if myalgias are correlated with Atorvastatin.        Hypertension    Not to goal.  DASH diet given.  Encouraged to eat better.  Add Magnesium for leg cramps which might help reduce BP  Relevant Orders   Comprehensive metabolic panel   Leg cramps, sleep related    Recommended a powdered form of Magnesium.           Follow up plan: Return in about 3 months (around 12/21/2017) for BP.

## 2017-09-20 NOTE — Assessment & Plan Note (Addendum)
Last LDL below 100.  Try a trial off of Atorvastatin for about 2 weeks and then restart to see if myalgias are correlated with Atorvastatin.

## 2017-09-20 NOTE — Patient Instructions (Signed)
Look at Magnesium Citrate powdered Natural Calm

## 2017-09-20 NOTE — Assessment & Plan Note (Signed)
Check CMP today.  Last Alk Phos improved

## 2017-09-21 ENCOUNTER — Encounter: Payer: Self-pay | Admitting: Unknown Physician Specialty

## 2017-09-21 LAB — COMPREHENSIVE METABOLIC PANEL
ALT: 27 IU/L (ref 0–32)
AST: 21 IU/L (ref 0–40)
Albumin/Globulin Ratio: 1.4 (ref 1.2–2.2)
Albumin: 4.2 g/dL (ref 3.6–4.8)
Alkaline Phosphatase: 175 IU/L — ABNORMAL HIGH (ref 39–117)
BUN/Creatinine Ratio: 15 (ref 12–28)
BUN: 12 mg/dL (ref 8–27)
Bilirubin Total: 0.4 mg/dL (ref 0.0–1.2)
CALCIUM: 9 mg/dL (ref 8.7–10.3)
CO2: 22 mmol/L (ref 20–29)
CREATININE: 0.82 mg/dL (ref 0.57–1.00)
Chloride: 104 mmol/L (ref 96–106)
GFR, EST AFRICAN AMERICAN: 84 mL/min/{1.73_m2} (ref >59)
GFR, EST NON AFRICAN AMERICAN: 73 mL/min/{1.73_m2} (ref >59)
Globulin, Total: 3.1 g/dL (ref 1.5–4.5)
Glucose: 85 mg/dL (ref 65–99)
Potassium: 4.2 mmol/L (ref 3.5–5.2)
Sodium: 139 mmol/L (ref 134–144)
TOTAL PROTEIN: 7.3 g/dL (ref 6.0–8.5)

## 2017-10-03 DIAGNOSIS — L409 Psoriasis, unspecified: Secondary | ICD-10-CM | POA: Diagnosis not present

## 2017-10-03 DIAGNOSIS — Z79899 Other long term (current) drug therapy: Secondary | ICD-10-CM | POA: Diagnosis not present

## 2017-10-03 DIAGNOSIS — M170 Bilateral primary osteoarthritis of knee: Secondary | ICD-10-CM | POA: Diagnosis not present

## 2017-10-03 DIAGNOSIS — M059 Rheumatoid arthritis with rheumatoid factor, unspecified: Secondary | ICD-10-CM | POA: Diagnosis not present

## 2017-11-29 ENCOUNTER — Encounter: Payer: Self-pay | Admitting: Unknown Physician Specialty

## 2017-12-09 ENCOUNTER — Other Ambulatory Visit: Payer: Self-pay | Admitting: Unknown Physician Specialty

## 2017-12-12 NOTE — Telephone Encounter (Signed)
atorvastatin refill Last Refill:08/03/17 # 90 Last OV: 09/20/17 PCP: Kathrine Haddock NP Pharmacy:[facility] [address] [place]

## 2017-12-21 ENCOUNTER — Ambulatory Visit (INDEPENDENT_AMBULATORY_CARE_PROVIDER_SITE_OTHER): Payer: Medicare Other | Admitting: Physician Assistant

## 2017-12-21 ENCOUNTER — Encounter: Payer: Self-pay | Admitting: Physician Assistant

## 2017-12-21 ENCOUNTER — Other Ambulatory Visit: Payer: Self-pay

## 2017-12-21 ENCOUNTER — Ambulatory Visit: Payer: Medicare Other | Admitting: Unknown Physician Specialty

## 2017-12-21 VITALS — BP 137/74 | HR 76 | Temp 98.4°F | Ht 66.0 in | Wt 238.4 lb

## 2017-12-21 DIAGNOSIS — E7800 Pure hypercholesterolemia, unspecified: Secondary | ICD-10-CM | POA: Diagnosis not present

## 2017-12-21 DIAGNOSIS — I10 Essential (primary) hypertension: Secondary | ICD-10-CM

## 2017-12-21 NOTE — Progress Notes (Signed)
   Subjective:    Patient ID: Angela Watts, female    DOB: 02/12/1951, 67 y.o.   MRN: [id number]  Angela Watts is a 67 y.o. female presenting on 12/21/2017 for Hypertension and Other (swollen ankle for about 2 weeks)   HPI   HTN: Taking amlodipine 5 mg.   BP Readings from Last 3 Encounters:  12/21/17 137/74  09/20/17 140/75  08/12/17 124/76   HLD: taking lipitor 20 mg daily and tolerates well.   Lipid Panel     Component Value Date/Time   CHOL 154 03/22/2017 1050   TRIG 43 03/22/2017 1050   HDL 52 03/22/2017 1050   LDLCALC 93 03/22/2017 1050     Social History   Tobacco Use  . Smoking status: Never Smoker  . Smokeless tobacco: Never Used  Substance Use Topics  . Alcohol use: Yes    Alcohol/week: 0.0 standard drinks    Comment: on occasion/socially  . Drug use: No    Review of Systems Per HPI unless specifically indicated above     Objective:    BP 137/74   Pulse 76   Temp 98.4 F (36.9 C) (Oral)   Ht 5\' 6"  (1.676 m)   Wt 238 lb 6.4 oz (108.1 kg)   LMP  (LMP Unknown)   SpO2 97%   BMI 38.48 kg/m   Wt Readings from Last 3 Encounters:  12/21/17 238 lb 6.4 oz (108.1 kg)  09/20/17 241 lb 3.2 oz (109.4 kg)  08/12/17 240 lb 9.6 oz (109.1 kg)    Physical Exam  Constitutional: She is oriented to person, place, and time. She appears well-developed and well-nourished.  Cardiovascular: Normal rate and regular rhythm.  Pulmonary/Chest: Effort normal and breath sounds normal.  Neurological: She is alert and oriented to person, place, and time.  Skin: Skin is warm and dry.  Psychiatric: She has a normal mood and affect. Her behavior is normal.   Results for orders placed or performed in visit on 09/20/17  Comprehensive metabolic panel  Result Value Ref Range   Glucose 85 65 - 99 mg/dL   BUN 12 8 - 27 mg/dL   Creatinine, Ser 0.82 0.57 - 1.00 mg/dL   GFR calc non Af Amer 73 >59 mL/min/1.73   GFR calc Af Amer 84 >59 mL/min/1.73   BUN/Creatinine Ratio  15 12 - 28   Sodium 139 134 - 144 mmol/L   Potassium 4.2 3.5 - 5.2 mmol/L   Chloride 104 96 - 106 mmol/L   CO2 22 20 - 29 mmol/L   Calcium 9.0 8.7 - 10.3 mg/dL   Total Protein 7.3 6.0 - 8.5 g/dL   Albumin 4.2 3.6 - 4.8 g/dL   Globulin, Total 3.1 1.5 - 4.5 g/dL   Albumin/Globulin Ratio 1.4 1.2 - 2.2   Bilirubin Total 0.4 0.0 - 1.2 mg/dL   Alkaline Phosphatase 175 (H) 39 - 117 IU/L   AST 21 0 - 40 IU/L   ALT 27 0 - 32 IU/L      Assessment & Plan:  1. Pure hypercholesterolemia  Stable, continue medication.  2. Essential hypertension  Stable continue medication.   Follow up plan: Return in about 3 months (around 03/23/2018) for CPE and follow up .  Carles Collet, PA-C [facility] 12/22/2017, 1:42 PM

## 2017-12-22 NOTE — Patient Instructions (Signed)

## 2018-02-02 DIAGNOSIS — Z79899 Other long term (current) drug therapy: Secondary | ICD-10-CM | POA: Diagnosis not present

## 2018-02-02 DIAGNOSIS — M2140 Flat foot [pes planus] (acquired), unspecified foot: Secondary | ICD-10-CM | POA: Diagnosis not present

## 2018-02-02 DIAGNOSIS — M170 Bilateral primary osteoarthritis of knee: Secondary | ICD-10-CM | POA: Diagnosis not present

## 2018-02-02 DIAGNOSIS — M1612 Unilateral primary osteoarthritis, left hip: Secondary | ICD-10-CM | POA: Diagnosis not present

## 2018-02-02 DIAGNOSIS — M059 Rheumatoid arthritis with rheumatoid factor, unspecified: Secondary | ICD-10-CM | POA: Diagnosis not present

## 2018-02-02 DIAGNOSIS — M069 Rheumatoid arthritis, unspecified: Secondary | ICD-10-CM | POA: Diagnosis not present

## 2018-03-08 ENCOUNTER — Other Ambulatory Visit: Payer: Self-pay | Admitting: Unknown Physician Specialty

## 2018-03-08 NOTE — Telephone Encounter (Signed)
Requested Prescriptions  Pending Prescriptions Disp Refills  . amLODipine (NORVASC) 5 MG tablet [Pharmacy Med Name: AMLODIPINE 5MG  TAB] 90 tablet 0    Sig: TAKE 1 TABLET BY MOUTH ONCE DAILY     Cardiovascular:  Calcium Channel Blockers Passed - 03/08/2018  2:01 PM      Passed - Last BP in normal range    BP Readings from Last 1 Encounters:  12/21/17 137/74         Passed - Valid encounter within last 6 months    Recent Outpatient Visits          2 months ago Pure hypercholesterolemia   [place], [place], PA-C   5 months ago Essential hypertension   [facility] Kathrine Haddock, NP   6 months ago Sore throat   [facility] Kathrine Haddock, NP   11 months ago Annual physical exam   [facility] Kathrine Haddock, NP   1 year ago Night sweats   [facility] Kathrine Haddock, NP      Future Appointments            In 2 weeks  [facility], [place]   In 2 weeks [name], Barbaraann Faster, NP [facility]

## 2018-03-12 ENCOUNTER — Telehealth: Payer: Self-pay | Admitting: Unknown Physician Specialty

## 2018-03-13 NOTE — Telephone Encounter (Signed)
Routing to close.  °

## 2018-03-13 NOTE — Telephone Encounter (Signed)
Requested Prescriptions  Pending Prescriptions Disp Refills  . atorvastatin (LIPITOR) 20 MG tablet [Pharmacy Med Name: ATORVASTATIN 20MG    TAB] 90 tablet 0    Sig: TAKE 1 TABLET BY MOUTH ONCE DAILY     Cardiovascular:  Antilipid - Statins Passed - 03/12/2018  5:52 PM      Passed - Total Cholesterol in normal range and within 360 days    Cholesterol, Total  Date Value Ref Range Status  03/22/2017 154 100 - 199 mg/dL Final         Passed - LDL in normal range and within 360 days    LDL Calculated  Date Value Ref Range Status  03/22/2017 93 0 - 99 mg/dL Final         Passed - HDL in normal range and within 360 days    HDL  Date Value Ref Range Status  03/22/2017 52 >39 mg/dL Final         Passed - Triglycerides in normal range and within 360 days    Triglycerides  Date Value Ref Range Status  03/22/2017 43 0 - 149 mg/dL Final         Passed - Patient is not pregnant      Passed - Valid encounter within last 12 months    Recent Outpatient Visits          2 months ago Pure hypercholesterolemia   [place], Adriana M, PA-C   5 months ago Essential hypertension   [place], Cheryl, NP   7 months ago Sore throat   [facility] Kathrine Haddock, NP   11 months ago Annual physical exam   [facility] Kathrine Haddock, NP   1 year ago Night sweats   [facility] Kathrine Haddock, NP      Future Appointments            In 1 week  [facility], [place]   In 2 weeks [name], Barbaraann Faster, NP [facility]

## 2018-03-16 DIAGNOSIS — M7062 Trochanteric bursitis, left hip: Secondary | ICD-10-CM | POA: Insufficient documentation

## 2018-03-16 DIAGNOSIS — Z79899 Other long term (current) drug therapy: Secondary | ICD-10-CM | POA: Diagnosis not present

## 2018-03-16 DIAGNOSIS — M170 Bilateral primary osteoarthritis of knee: Secondary | ICD-10-CM | POA: Diagnosis not present

## 2018-03-16 DIAGNOSIS — M059 Rheumatoid arthritis with rheumatoid factor, unspecified: Secondary | ICD-10-CM | POA: Diagnosis not present

## 2018-03-23 ENCOUNTER — Ambulatory Visit (INDEPENDENT_AMBULATORY_CARE_PROVIDER_SITE_OTHER): Payer: Medicare Other

## 2018-03-23 VITALS — BP 130/62 | HR 87 | Temp 98.0°F | Ht 66.0 in | Wt 236.0 lb

## 2018-03-23 DIAGNOSIS — Z23 Encounter for immunization: Secondary | ICD-10-CM

## 2018-03-23 DIAGNOSIS — Z0000 Encounter for general adult medical examination without abnormal findings: Secondary | ICD-10-CM | POA: Diagnosis not present

## 2018-03-23 MED ORDER — ZOSTER VAC RECOMB ADJUVANTED 50 MCG/0.5ML IM SUSR: 0.5000 mL | Freq: Once | INTRAMUSCULAR | 1 refills | Status: AC

## 2018-03-23 NOTE — Patient Instructions (Signed)
Angela Watts , Thank you for taking time to come for your Medicare Wellness Visit. I appreciate your ongoing commitment to your health goals. Please review the following plan we discussed and let me know if I can assist you in the future.   Screening recommendations/referrals: Colonoscopy up to date, due 06/10/2025 Mammogram up to date, due 05/24/2018 Bone Density up to date Recommended yearly ophthalmology/optometry visit for glaucoma screening and checkup Recommended yearly dental visit for hygiene and checkup  Vaccinations: Influenza vaccine given today Pneumococcal vaccine up to date, completed Tdap vaccine up to date, due 03/17/2025 Shingles vaccine due. Ordered to pharmacy    Advanced directives: Advance directive discussed with you today. I have provided a copy for you to complete at home and have notarized. Once this is complete please bring a copy in to our office so we can scan it into your chart.  Conditions/risks identified: none  Next appointment: [name], NP 03/27/2018 @ 9am   Preventive Care 65 Years and Older, Female Preventive care refers to lifestyle choices and visits with your health care provider that can promote health and wellness. What does preventive care include?  A yearly physical exam. This is also called an annual well check.  Dental exams once or twice a year.  Routine eye exams. Ask your health care provider how often you should have your eyes checked.  Personal lifestyle choices, including:  Daily care of your teeth and gums.  Regular physical activity.  Eating a healthy diet.  Avoiding tobacco and drug use.  Limiting alcohol use.  Practicing safe sex.  Taking low-dose aspirin every day.  Taking vitamin and mineral supplements as recommended by your health care provider. What happens during an annual well check? The services and screenings done by your health care provider during your annual well check will depend on your age, overall  health, lifestyle risk factors, and family history of disease. Counseling  Your health care provider may ask you questions about your:  Alcohol use.  Tobacco use.  Drug use.  Emotional well-being.  Home and relationship well-being.  Sexual activity.  Eating habits.  History of falls.  Memory and ability to understand (cognition).  Work and work Statistician.  Reproductive health. Screening  You may have the following tests or measurements:  Height, weight, and BMI.  Blood pressure.  Lipid and cholesterol levels. These may be checked every 5 years, or more frequently if you are over 76 years old.  Skin check.  Lung cancer screening. You may have this screening every year starting at age 78 if you have a 30-pack-year history of smoking and currently smoke or have quit within the past 15 years.  Fecal occult blood test (FOBT) of the stool. You may have this test every year starting at age 25.  Flexible sigmoidoscopy or colonoscopy. You may have a sigmoidoscopy every 5 years or a colonoscopy every 10 years starting at age 5.  Hepatitis C blood test.  Hepatitis B blood test.  Sexually transmitted disease (STD) testing.  Diabetes screening. This is done by checking your blood sugar (glucose) after you have not eaten for a while (fasting). You may have this done every 1-3 years.  Bone density scan. This is done to screen for osteoporosis. You may have this done starting at age 21.  Mammogram. This may be done every 1-2 years. Talk to your health care provider about how often you should have regular mammograms. Talk with your health care provider about your test results, treatment  options, and if necessary, the need for more tests. Vaccines  Your health care provider may recommend certain vaccines, such as:  Influenza vaccine. This is recommended every year.  Tetanus, diphtheria, and acellular pertussis (Tdap, Td) vaccine. You may need a Td booster every 10  years.  Zoster vaccine. You may need this after age 61.  Pneumococcal 13-valent conjugate (PCV13) vaccine. One dose is recommended after age 54.  Pneumococcal polysaccharide (PPSV23) vaccine. One dose is recommended after age 1. Talk to your health care provider about which screenings and vaccines you need and how often you need them. This information is not intended to replace advice given to you by your health care provider. Make sure you discuss any questions you have with your health care provider. Document Released: 05/30/2015 Document Revised: 01/21/2016 Document Reviewed: 03/04/2015 Elsevier Interactive Patient Education  2017 [place] Prevention in the Home Falls can cause injuries. They can happen to people of all ages. There are many things you can do to make your home safe and to help prevent falls. What can I do on the outside of my home?  Regularly fix the edges of walkways and driveways and fix any cracks.  Remove anything that might make you trip as you walk through a door, such as a raised step or threshold.  Trim any bushes or trees on the path to your home.  Use bright outdoor lighting.  Clear any walking paths of anything that might make someone trip, such as rocks or tools.  Regularly check to see if handrails are loose or broken. Make sure that both sides of any steps have handrails.  Any raised decks and porches should have guardrails on the edges.  Have any leaves, snow, or ice cleared regularly.  Use sand or salt on walking paths during winter.  Clean up any spills in your garage right away. This includes oil or grease spills. What can I do in the bathroom?  Use night lights.  Install grab bars by the toilet and in the tub and shower. Do not use towel bars as grab bars.  Use non-skid mats or decals in the tub or shower.  If you need to sit down in the shower, use a plastic, non-slip stool.  Keep the floor dry. Clean up any water that  spills on the floor as soon as it happens.  Remove soap buildup in the tub or shower regularly.  Attach bath mats securely with double-sided non-slip rug tape.  Do not have throw rugs and other things on the floor that can make you trip. What can I do in the bedroom?  Use night lights.  Make sure that you have a light by your bed that is easy to reach.  Do not use any sheets or blankets that are too big for your bed. They should not hang down onto the floor.  Have a firm chair that has side arms. You can use this for support while you get dressed.  Do not have throw rugs and other things on the floor that can make you trip. What can I do in the kitchen?  Clean up any spills right away.  Avoid walking on wet floors.  Keep items that you use a lot in easy-to-reach places.  If you need to reach something above you, use a strong step stool that has a grab bar.  Keep electrical cords out of the way.  Do not use floor polish or wax that makes floors slippery.  If you must use wax, use non-skid floor wax.  Do not have throw rugs and other things on the floor that can make you trip. What can I do with my stairs?  Do not leave any items on the stairs.  Make sure that there are handrails on both sides of the stairs and use them. Fix handrails that are broken or loose. Make sure that handrails are as long as the stairways.  Check any carpeting to make sure that it is firmly attached to the stairs. Fix any carpet that is loose or worn.  Avoid having throw rugs at the top or bottom of the stairs. If you do have throw rugs, attach them to the floor with carpet tape.  Make sure that you have a light switch at the top of the stairs and the bottom of the stairs. If you do not have them, ask someone to add them for you. What else can I do to help prevent falls?  Wear shoes that:  Do not have high heels.  Have rubber bottoms.  Are comfortable and fit you well.  Are closed at the  toe. Do not wear sandals.  If you use a stepladder:  Make sure that it is fully opened. Do not climb a closed stepladder.  Make sure that both sides of the stepladder are locked into place.  Ask someone to hold it for you, if possible.  Clearly mark and make sure that you can see:  Any grab bars or handrails.  First and last steps.  Where the edge of each step is.  Use tools that help you move around (mobility aids) if they are needed. These include:  Canes.  Walkers.  Scooters.  Crutches.  Turn on the lights when you go into a dark area. Replace any light bulbs as soon as they burn out.  Set up your furniture so you have a clear path. Avoid moving your furniture around.  If any of your floors are uneven, fix them.  If there are any pets around you, be aware of where they are.  Review your medicines with your doctor. Some medicines can make you feel dizzy. This can increase your chance of falling. Ask your doctor what other things that you can do to help prevent falls. This information is not intended to replace advice given to you by your health care provider. Make sure you discuss any questions you have with your health care provider. Document Released: 02/27/2009 Document Revised: 10/09/2015 Document Reviewed: 06/07/2014 Elsevier Interactive Patient Education  2017 [employer].

## 2018-03-23 NOTE — Progress Notes (Signed)
Subjective:   Angela Watts is a 67 y.o. female who presents for Medicare Annual (Subsequent) preventive examination.  Last AWV-03/21/2017    Objective:     Vitals: BP 130/62 (BP Location: Left Arm, Patient Position: Sitting)   Pulse 87   Temp 98 F (36.7 C) (Oral)   Ht 5\' 6"  (1.676 m)   Wt 236 lb (107 kg)   LMP  (LMP Unknown)   SpO2 96%   BMI 38.09 kg/m   Body mass index is 38.09 kg/m.  Advanced Directives 03/23/2018 03/21/2017 09/18/2016 07/27/2016 03/19/2016 06/11/2015 03/18/2015  Does Patient Have a Medical Advance Directive? No Yes No No Yes No Yes  Type of Advance Directive - [place];Living will - - Living will - Living will  Copy of [place] in Chart? - No - copy requested - - - - -  Would patient like information on creating a medical advance directive? Yes (MAU/Ambulatory/Procedural Areas - Information given) - - No - Patient declined - No - patient declined information -    Tobacco Social History   Tobacco Use  Smoking Status Never Smoker  Smokeless Tobacco Never Used     Counseling given: Not Answered   Clinical Intake:  Pre-visit preparation completed: No  Pain : No/denies pain     Diabetes: No  How often do you need to have someone help you when you read instructions, pamphlets, or other written materials from your doctor or pharmacy?: 1 - Never What is the last grade level you completed in school?: college  Interpreter Needed?: No  Information entered by :: Tyson Dense, RN  Past Medical History:  Diagnosis Date  . Abnormal Pap smear of cervix   . Arthritis   . GERD (gastroesophageal reflux disease)   . Hyperlipidemia   . Hypertension    Past Surgical History:  Procedure Laterality Date  . ABDOMINAL HYSTERECTOMY  2003  . BREAST CYST ASPIRATION Left 2011   NEG/ [facility] [place]  . COLONOSCOPY WITH PROPOFOL N/A 06/11/2015   Procedure: COLONOSCOPY WITH PROPOFOL;  Surgeon: Manya Silvas, MD;  Location[facility]  [facility]COPY;  Service: Endoscopy;  Laterality: N/A;  . CYST EXCISION Left    breast   Family History  Problem Relation Age of Onset  . Hypertension Mother   . Arthritis Mother   . Breast cancer Mother   . Cancer Mother        breast  . Hypertension Father   . Lung cancer Father   . Diabetes Sister   . Hypertension Sister   . Hypertension Brother   . Hypertension Sister   . Hypertension Sister   . Hypertension Sister   . Hypertension Brother   . Cancer Brother        lung   Social History   Socioeconomic History  . Marital status: Single    Spouse name: Not on file  . Number of children: Not on file  . Years of education: Not on file  . Highest education level: Not on file  Occupational History  . Not on file  Social Needs  . Financial resource strain: Not hard at all  . Food insecurity:    Worry: Never true    Inability: Never true  . Transportation needs:    Medical: No    Non-medical: No  Tobacco Use  . Smoking status: Never Smoker  . Smokeless tobacco: Never Used  Substance and Sexual Activity  . Alcohol use: Yes  Alcohol/week: 0.0 standard drinks    Comment: on occasion/socially  . Drug use: No  . Sexual activity: Never  Lifestyle  . Physical activity:    Days per week: 0 days    Minutes per session: 0 min  . Stress: Only a little  Relationships  . Social connections:    Talks on phone: More than three times a week    Gets together: More than three times a week    Attends religious service: More than 4 times per year    Active member of club or organization: No    Attends meetings of clubs or organizations: Never    Relationship status: Never married  Other Topics Concern  . Not on file  Social History Narrative  . Not on file    Outpatient Encounter Medications as of 03/23/2018  Medication Sig  . amLODipine (NORVASC) 5 MG tablet TAKE 1 TABLET BY MOUTH ONCE DAILY  . aspirin 81 MG tablet Take 81 mg by mouth daily.  Marland Kitchen atorvastatin  (LIPITOR) 20 MG tablet TAKE 1 TABLET BY MOUTH ONCE DAILY  . Ca Phosphate-Cholecalciferol (CALTRATE GUMMY BITES) 250-400 MG-UNIT CHEW Chew by mouth daily.  . folic acid (FOLVITE) 1 MG tablet Take 1 tablet by mouth daily.  . methotrexate (RHEUMATREX) 2.5 MG tablet Take 4 tabs once a week for 12 weeks  . montelukast (SINGULAIR) 10 MG tablet Take 1 tablet (10 mg total) by mouth at bedtime.  . Multiple Vitamin (MULTIVITAMIN) tablet Take 1 tablet by mouth daily.  Marland Kitchen Zoster Vaccine Adjuvanted [facility]) injection Inject 0.5 mLs into the muscle once for 1 dose.  . [DISCONTINUED] Zoster Vaccine Adjuvanted [facility]) injection Inject 0.5 mLs into the muscle once.   No facility-administered encounter medications on file as of 03/23/2018.     Activities of Daily Living In your present state of health, do you have any difficulty performing the following activities: 03/23/2018  Hearing? N  Vision? N  Difficulty concentrating or making decisions? N  Walking or climbing stairs? N  Dressing or bathing? N  Doing errands, shopping? N  Preparing Food and eating ? N  Using the Toilet? N  In the past six months, have you accidently leaked urine? Y  Comment urgent  Do you have problems with loss of bowel control? N  Managing your Medications? N  Managing your Finances? N  Housekeeping or managing your Housekeeping? N  Some recent data might be hidden    Patient Care Team: Venita Lick, NP as PCP - General (Nurse Practitioner) Valerie Roys, DO as Referring Physician (Family Medicine) Bary Castilla Forest Gleason, MD (General Surgery) Marlowe Sax, MD as Referring Physician (Internal Medicine)    Assessment:   This is a routine wellness examination for Angela Watts.  Exercise Activities and Dietary recommendations Current Exercise Habits: The patient does not participate in regular exercise at present, Exercise limited by: None identified  Goals   None     Fall Risk Fall Risk  03/23/2018  03/21/2017 03/19/2016 03/18/2015  Falls in the past year? 0 No No No  Number falls in past yr: 0 - - -  Injury with Fall? 0 - - -   Is the patient's home free of loose throw rugs in walkways, pet beds, electrical cords, etc?   yes      Grab bars in the bathroom? no      Handrails on the stairs?   yes      Adequate lighting?   yes  Depression Screen PHQ  2/9 Scores 03/23/2018 03/21/2017 03/19/2016 03/18/2015  PHQ - 2 Score 1 1 0 0  PHQ- 9 Score - 3 0 -     Cognitive Function     6CIT Screen 03/23/2018 03/21/2017  What Year? 0 points 0 points  What month? 0 points 0 points  What time? 0 points 0 points  Count back from 20 0 points 0 points  Months in reverse 0 points 0 points  Repeat phrase 4 points 0 points  Total Score 4 0    Immunization History  Administered Date(s) Administered  . Influenza, High Dose Seasonal PF 03/19/2016, 03/21/2017, 03/23/2018  . Influenza,inj,Quad PF,6+ Mos 03/18/2015  . Pneumococcal Conjugate-13 02/11/2014  . Pneumococcal Polysaccharide-23 02/03/2010, 03/18/2015  . Td 03/19/2004  . Tdap 03/18/2015  . Zoster 02/20/2013    Qualifies for Shingles Vaccine? Yes, educated and ordered to pharmacy  Screening Tests Health Maintenance  Topic Date Due  . INFLUENZA VACCINE  12/15/2017  . MAMMOGRAM  05/05/2018  . TETANUS/TDAP  03/17/2025  . COLONOSCOPY  06/10/2025  . DEXA SCAN  Completed  . Hepatitis C Screening  Completed  . PNA vac Low Risk Adult  Completed    Cancer Screenings: Lung: Low Dose CT Chest recommended if Age 70-80 years, 30 pack-year currently smoking OR have quit w/in 15years. Patient does not qualify. Breast:  Up to date on Mammogram? Yes   Up to date of Bone Density/Dexa? Yes Colorectal: up to date  Additional Screenings:  Hepatitis C Screening: declined High dose flu vaccine given today     Plan:    I have personally reviewed and addressed the Medicare Annual Wellness questionnaire and have noted the following in the patient's  chart:  A. Medical and social history B. Use of alcohol, tobacco or illicit drugs  C. Current medications and supplements D. Functional ability and status E.  Nutritional status F.  Physical activity G. Advance directives H. List of other physicians I.  Hospitalizations, surgeries, and ER visits in previous 12 months J.  [place] to include hearing, vision, cognitive, depression L. Referrals and appointments - none  In addition, I have reviewed and discussed with patient certain preventive protocols, quality metrics, and best practice recommendations. A written personalized care plan for preventive services as well as general preventive health recommendations were provided to patient.  See attached scanned questionnaire for additional information.   Signed,   Tyson Dense, RN Nurse Health Advisor  Patient Concerns: None

## 2018-03-27 ENCOUNTER — Encounter: Payer: Medicare Other | Admitting: Nurse Practitioner

## 2018-03-30 ENCOUNTER — Encounter: Payer: Self-pay | Admitting: Nurse Practitioner

## 2018-03-30 ENCOUNTER — Ambulatory Visit (INDEPENDENT_AMBULATORY_CARE_PROVIDER_SITE_OTHER): Payer: Medicare Other | Admitting: Nurse Practitioner

## 2018-03-30 VITALS — BP 137/78 | HR 75 | Temp 98.5°F | Ht 66.0 in | Wt 234.6 lb

## 2018-03-30 DIAGNOSIS — R928 Other abnormal and inconclusive findings on diagnostic imaging of breast: Secondary | ICD-10-CM

## 2018-03-30 DIAGNOSIS — I10 Essential (primary) hypertension: Secondary | ICD-10-CM | POA: Diagnosis not present

## 2018-03-30 DIAGNOSIS — M069 Rheumatoid arthritis, unspecified: Secondary | ICD-10-CM

## 2018-03-30 DIAGNOSIS — J301 Allergic rhinitis due to pollen: Secondary | ICD-10-CM | POA: Diagnosis not present

## 2018-03-30 DIAGNOSIS — E7800 Pure hypercholesterolemia, unspecified: Secondary | ICD-10-CM

## 2018-03-30 DIAGNOSIS — Z0000 Encounter for general adult medical examination without abnormal findings: Secondary | ICD-10-CM

## 2018-03-30 NOTE — Assessment & Plan Note (Signed)
H/O cyst.  Mammogram screening ordered.

## 2018-03-30 NOTE — Assessment & Plan Note (Signed)
Continue focus on diet and exercise.

## 2018-03-30 NOTE — Progress Notes (Signed)
BP 137/78 (BP Location: Left Arm, Cuff Size: Large)   Pulse 75   Temp 98.5 F (36.9 C) (Oral)   Ht 5\' 6"  (1.676 m)   Wt 234 lb 9.6 oz (106.4 kg)   LMP  (LMP Unknown)   SpO2 97%   BMI 37.87 kg/m    Subjective:    Patient ID: Angela Watts, female    DOB: October 19, 1950, 67 y.o.   MRN: [id number]  HPI: SHAWNIA VIZCARRONDO is a 67 y.o. female presents for annual physical  Chief Complaint  Patient presents with  . Annual Exam    pt had wellness exam 03/23/18   HYPERTENSION / HYPERLIPIDEMIA Satisfied with current treatment? yes Duration of hypertension: chronic BP monitoring frequency: a few times a week BP range: 130's/60-80's BP medication side effects: no Past BP meds: none Duration of hyperlipidemia: chronic Cholesterol medication side effects: occasional muscle cramps legs Cholesterol supplements: none Past cholesterol medications: none Medication compliance: excellent compliance Aspirin: yes Recent stressors: no Recurrent headaches: no Visual changes: no Palpitations: no Dyspnea: no Chest pain: no Lower extremity edema: no Dizzy/lightheaded: no  RHEUMATOID ARTHRITIS: Dr. Luevenia Maxin seen two weeks ago and a steroid shot was administered to left hip and encouraged her to perform exercises.  She visits with clinic every 6 months and continues on Methotrexate.    OBESITY: Has treadmill at home and "needs to get back to using it", was encouraged by rheumatology to utilize 2-3 days a week for 30 minutes.  Encouraged patient to do the same.  Discussed benefit of healthy diet and regular exercise (30 minutes, 5 days a week).  ALLERGIC RHINITIS: She reports this issue is resolved.  No longer takes Montelukast or medications at home.  No further issues.  Will resolve on chart.  Depression screen [facility] 2/9 03/23/2018 03/21/2017 03/19/2016 03/18/2015  Decreased Interest 0 0 0 0  Down, Depressed, Hopeless 1 1 0 0  PHQ - 2 Score 1 1 0 0  Altered sleeping - 0 0 -  Tired,  decreased energy - 1 0 -  Change in appetite - 1 0 -  Feeling bad or failure about yourself  - 0 0 -  Trouble concentrating - 0 0 -  Moving slowly or fidgety/restless - 0 0 -  Suicidal thoughts - 0 0 -  PHQ-9 Score - 3 0 -  Difficult doing work/chores - Not difficult at all - -   Functional Status Survey: Is the patient deaf or have difficulty hearing?: No Does the patient have difficulty seeing, even when wearing glasses/contacts?: No Does the patient have difficulty concentrating, remembering, or making decisions?: No Does the patient have difficulty walking or climbing stairs?: No Does the patient have difficulty dressing or bathing?: No Does the patient have difficulty doing errands alone such as visiting a doctor's office or shopping?: No   Social History   Socioeconomic History  . Marital status: Single    Spouse name: Not on file  . Number of children: Not on file  . Years of education: Not on file  . Highest education level: Not on file  Occupational History  . Not on file  Social Needs  . Financial resource strain: Not hard at all  . Food insecurity:    Worry: Never true    Inability: Never true  . Transportation needs:    Medical: No    Non-medical: No  Tobacco Use  . Smoking status: Never Smoker  . Smokeless tobacco: Never Used  Substance and  Sexual Activity  . Alcohol use: Yes    Alcohol/week: 0.0 standard drinks    Comment: on occasion/socially  . Drug use: No  . Sexual activity: Never  Lifestyle  . Physical activity:    Days per week: 0 days    Minutes per session: 0 min  . Stress: Only a little  Relationships  . Social connections:    Talks on phone: More than three times a week    Gets together: More than three times a week    Attends religious service: More than 4 times per year    Active member of club or organization: No    Attends meetings of clubs or organizations: Never    Relationship status: Never married  . Intimate partner violence:      Fear of current or ex partner: No    Emotionally abused: No    Physically abused: No    Forced sexual activity: No  Other Topics Concern  . Not on file  Social History Narrative  . Not on file    Relevant past medical, surgical, family and social history reviewed and updated as indicated. Interim medical history since our last visit reviewed. Allergies and medications reviewed and updated.  Review of Systems  Constitutional: Negative for activity change, appetite change and fatigue.  HENT: Negative.   Eyes: Negative.   Respiratory: Negative for cough, chest tightness, shortness of breath and wheezing.   Cardiovascular: Negative for chest pain, palpitations and leg swelling.  Gastrointestinal: Negative for abdominal distention, abdominal pain, constipation, diarrhea, nausea and vomiting.  Endocrine: Negative for cold intolerance, heat intolerance, polydipsia, polyphagia and polyuria.  Genitourinary: Negative.   Musculoskeletal: Positive for arthralgias. Negative for gait problem, joint swelling, myalgias, neck pain and neck stiffness.  Skin: Negative.   Allergic/Immunologic: Negative.   Neurological: Negative for dizziness, speech difficulty, weakness, light-headedness, numbness and headaches.  Hematological: Negative.   Psychiatric/Behavioral: Negative for behavioral problems, confusion, decreased concentration and sleep disturbance. The patient is not nervous/anxious.    Per HPI unless specifically indicated above     Objective:    BP 137/78 (BP Location: Left Arm, Cuff Size: Large)   Pulse 75   Temp 98.5 F (36.9 C) (Oral)   Ht 5\' 6"  (1.676 m)   Wt 234 lb 9.6 oz (106.4 kg)   LMP  (LMP Unknown)   SpO2 97%   BMI 37.87 kg/m   Wt Readings from Last 3 Encounters:  03/30/18 234 lb 9.6 oz (106.4 kg)  03/23/18 236 lb (107 kg)  12/21/17 238 lb 6.4 oz (108.1 kg)    Physical Exam  Constitutional: She is oriented to person, place, and time. She appears well-developed and  well-nourished.  HENT:  Head: Normocephalic and atraumatic.  Right Ear: Hearing, tympanic membrane, external ear and ear canal normal.  Left Ear: Hearing, tympanic membrane, external ear and ear canal normal.  Nose: Nose normal. Right sinus exhibits no maxillary sinus tenderness and no frontal sinus tenderness. Left sinus exhibits no maxillary sinus tenderness and no frontal sinus tenderness.  Mouth/Throat: Oropharynx is clear and moist.  Eyes: Pupils are equal, round, and reactive to light. Conjunctivae and EOM are normal. Right eye exhibits no discharge. Left eye exhibits no discharge.  Arcus senilis bilateral eyes.    Neck: Normal range of motion. Neck supple. No JVD present. Carotid bruit is not present. No thyromegaly present.  Cardiovascular: Normal rate, regular rhythm, normal heart sounds and intact distal pulses.  Pulmonary/Chest: Effort normal and breath sounds normal.  Right breast exhibits no inverted nipple, no mass, no nipple discharge, no skin change and no tenderness. Left breast exhibits no inverted nipple, no mass, no nipple discharge, no skin change and no tenderness.  Abdominal: Soft. Bowel sounds are normal. There is no splenomegaly or hepatomegaly.  Musculoskeletal: Normal range of motion.  Lymphadenopathy:    She has no cervical adenopathy.  Neurological: She is alert and oriented to person, place, and time. She has normal reflexes.  Reflex Scores:      Brachioradialis reflexes are 2+ on the right side and 2+ on the left side.      Patellar reflexes are 2+ on the right side and 2+ on the left side. Skin: Skin is warm and dry.  Psychiatric: She has a normal mood and affect. Her behavior is normal.    Results for orders placed or performed in visit on 09/20/17  Comprehensive metabolic panel  Result Value Ref Range   Glucose 85 65 - 99 mg/dL   BUN 12 8 - 27 mg/dL   Creatinine, Ser 0.82 0.57 - 1.00 mg/dL   GFR calc non Af Amer 73 >59 mL/min/1.73   GFR calc Af Amer 84  >59 mL/min/1.73   BUN/Creatinine Ratio 15 12 - 28   Sodium 139 134 - 144 mmol/L   Potassium 4.2 3.5 - 5.2 mmol/L   Chloride 104 96 - 106 mmol/L   CO2 22 20 - 29 mmol/L   Calcium 9.0 8.7 - 10.3 mg/dL   Total Protein 7.3 6.0 - 8.5 g/dL   Albumin 4.2 3.6 - 4.8 g/dL   Globulin, Total 3.1 1.5 - 4.5 g/dL   Albumin/Globulin Ratio 1.4 1.2 - 2.2   Bilirubin Total 0.4 0.0 - 1.2 mg/dL   Alkaline Phosphatase 175 (H) 39 - 117 IU/L   AST 21 0 - 40 IU/L   ALT 27 0 - 32 IU/L      Assessment & Plan:   Problem List Items Addressed This Visit      Cardiovascular and Mediastinum   Hypertension    Chronic, goal <130/90.  At goal today and on home BP.  Continue current regimen.  CMP and CBC today.      Relevant Orders   CBC with Differential/Platelet   Comprehensive metabolic panel   TSH     Respiratory   RESOLVED: Allergic rhinitis    Issue resolved, no longer taking Montelukast.        Musculoskeletal and Integument   RA (rheumatoid arthritis) ([place])    Followed by rheumatology every 6 months.  Continue to collaborate with providers.      Relevant Medications   methotrexate (RHEUMATREX) 2.5 MG tablet     Other   Hyperlipidemia    Chronic, ongoing.  Goal LDL <100.  Previous 93.  Continue Atorvastatin.  Lipid panel today.      Relevant Orders   Lipid Panel w/o Chol/HDL Ratio   Abnormal mammogram of left breast    H/O cyst.  Mammogram screening ordered.      Relevant Orders   MM DIGITAL SCREENING BILATERAL      Time: 25 minutes, >50% spent counseling on DASH diet and healthy eating  Follow up plan: Return in about 6 months (around 09/28/2018) for HTN and HLD.

## 2018-03-30 NOTE — Assessment & Plan Note (Addendum)
Chronic, ongoing.  Goal LDL <100.  Previous 93.  Continue Atorvastatin.  Lipid panel today.

## 2018-03-30 NOTE — Patient Instructions (Signed)
Eating Healthy on a Budget There are many ways to save money at the grocery store and continue to eat healthy. You can be successful if you plan your meals according to your budget, purchase according to your budget and grocery list, and prepare food yourself. How can I buy more food on a limited budget? Plan  Plan meals and snacks according to a grocery list and budget you create.  Look for recipes where you can cook once and make enough food for two meals.  Include meals that will "stretch" more expensive foods such as stews, casseroles, and stir-fry dishes.  Make a grocery list and make sure to bring it with you to the store. If you have a smart phone, you could use your phone to create your shopping list. Purchase  When grocery shopping, buy only the items on your grocery list and go only to the areas of the store that have the items on your list. Prepare  Some meal items can be prepared in advance. Pre-cook on days when you have extra time.  Make extra food (such as by doubling recipes) and freeze the extras in meal-sized containers or in individual portions for fast meals and snacks.  Use leftovers in your meal plan for the week.  Try some meatless meals or try "no cook" meals like salads.  When you come home from the grocery store, wash and prepare your fruits and vegetables so they are ready to use and eat. This will help reduce food waste. How can I buy more food on a limited budget? Try these tips the next time you go shopping:  Buy store brands or generic brands.  Use coupons only for foods and brands you normally buy. Avoid buying items you wouldn't normally buy simply because they are on sale.  Check online and in newspapers for weekly deals.  Buy healthy items from the bulk bins when available, such as herbs, spices, flours, pastas, nuts, and dried fruit.  Buy fruits and vegetables that are in season. Prices are usually lower on in-season produce.  Compare and  contrast different items. You can do this by looking at the unit price on the price tag. Use it to compare different brands and sizes to find out which item is the best deal.  Choose naturally low-cost healthy items, such as carrots, potatoes, apples, bananas, and oranges. Dried or canned beans are a low-cost protein source.  Buy in bulk and freeze extra food. Items you can buy in bulk include meats, fish, poultry, frozen fruits, and frozen vegetables.  Limit the purchase of prepared or "ready-to-eat" foods, such as pre-cut fruits and vegetables and pre-made salads.  If possible, shop around to discover which grocery store offers the best prices. Some stores charge much more than other stores for the same items.  Do not shop when you are hungry. If you shop while hungry, It may be hard to stick to your list and budget.  Stick to your list and resist impulse buys. Treat your list as your official plan for the week.  Buy a variety of vegetables and fruit by purchasing fresh, frozen, and canned items.  Look beyond eye level. Foods at eye level (adult or child eye level) are more expensive. Look at the top and bottom shelves for deals.  Be efficient with your time when shopping. The more time you spend at the store, the more money you are likely to spend.  Consider other retailers such as dollar stores, larger wholesale   stores, local fruit and vegetable stands, and farmers markets.  What are some tips for less expensive food substitutions? When choosing more expensive foods like meats and dairy, try these tips to save money:  Choose cheaper cuts of meat, such as bone-in chicken thighs and drumsticks instead skinless and boneless chicken. When you are ready to prepare the chicken, you can remove the skin yourself to make it healthier.  Choose lean meats like chicken or [place]. When choosing ground beef, make sure it is lean ground beef (92% lean, 8% fat). If you do buy a fattier ground beef,  drain the fat before eating.  Buy dried beans and peas, such as lentils, split peas, or kidney beans.  For seafood, choose canned tuna, salmon, or sardines.  Eggs are a low-cost source of protein.  Buy the larger tubs of yogurt instead of individual-sized containers.  Choose water instead of sodas and other sweetened beverages.  Skip buying chips, cookies, and other "junk food". These items are usually expensive, high in calories, and low in nutritional value.  How can I prepare the foods I buy in the healthiest way? Practice these tips for cooking foods in the healthiest way to reduce excess fat and calorie intake:  Steam, saute, grill, or bake foods instead of frying them.  Make sure half your plate is filled with fruits or vegetables. Choose from fresh, frozen, or canned fruits and vegetables. If eating canned, remember to rinse them before eating. This will remove any excess salt added for packaging.  Trim all fat from meat before cooking. Remove the skin from chicken or [place].  Spoon off fat from meat dishes once they have been chilled in the refrigerator and the fat has hardened on the top.  Use skim milk, low-fat milk, or evaporated skim milk when making cream sauces, soups, or puddings.  Substitute low-fat yogurt, sour cream, or cottage cheese for sour cream and mayonnaise in dips and dressings.  Try lemon juice, herbs, or spices to season food instead of salt, butter, or margarine.  This information is not intended to replace advice given to you by your health care provider. Make sure you discuss any questions you have with your health care provider. Document Released: 01/04/2014 Document Revised: 11/21/2015 Document Reviewed: 12/04/2013 Elsevier Interactive Patient Education  2018 [place] Eating Plan DASH stands for "Dietary Approaches to Stop Hypertension." The DASH eating plan is a healthy eating plan that has been shown to reduce high blood pressure  (hypertension). It may also reduce your risk for type 2 diabetes, heart disease, and stroke. The DASH eating plan may also help with weight loss. What are tips for following this plan? General guidelines  Avoid eating more than 2,300 mg (milligrams) of salt (sodium) a day. If you have hypertension, you may need to reduce your sodium intake to 1,500 mg a day.  Limit alcohol intake to no more than 1 drink a day for nonpregnant women and 2 drinks a day for men. One drink equals 12 oz of beer, 5 oz of wine, or 1 oz of hard liquor.  Work with your health care provider to maintain a healthy body weight or to lose weight. Ask what an ideal weight is for you.  Get at least 30 minutes of exercise that causes your heart to beat faster (aerobic exercise) most days of the week. Activities may include walking, swimming, or biking.  Work with your health care provider or diet and nutrition specialist (dietitian) to adjust your  eating plan to your individual calorie needs. Reading food labels  Check food labels for the amount of sodium per serving. Choose foods with less than 5 percent of the Daily Value of sodium. Generally, foods with less than 300 mg of sodium per serving fit into this eating plan.  To find whole grains, look for the word "whole" as the first word in the ingredient list. Shopping  Buy products labeled as "low-sodium" or "no salt added."  Buy fresh foods. Avoid canned foods and premade or frozen meals. Cooking  Avoid adding salt when cooking. Use salt-free seasonings or herbs instead of table salt or sea salt. Check with your health care provider or pharmacist before using salt substitutes.  Do not fry foods. Cook foods using healthy methods such as baking, boiling, grilling, and broiling instead.  Cook with heart-healthy oils, such as olive, canola, soybean, or sunflower oil. Meal planning   Eat a balanced diet that includes: ? 5 or more servings of fruits and vegetables each  day. At each meal, try to fill half of your plate with fruits and vegetables. ? Up to 6-8 servings of whole grains each day. ? Less than 6 oz of lean meat, poultry, or fish each day. A 3-oz serving of meat is about the same size as a deck of cards. One egg equals 1 oz. ? 2 servings of low-fat dairy each day. ? A serving of nuts, seeds, or beans 5 times each week. ? Heart-healthy fats. Healthy fats called Omega-3 fatty acids are found in foods such as flaxseeds and coldwater fish, like sardines, salmon, and mackerel.  Limit how much you eat of the following: ? Canned or prepackaged foods. ? Food that is high in trans fat, such as fried foods. ? Food that is high in saturated fat, such as fatty meat. ? Sweets, desserts, sugary drinks, and other foods with added sugar. ? Full-fat dairy products.  Do not salt foods before eating.  Try to eat at least 2 vegetarian meals each week.  Eat more home-cooked food and less restaurant, buffet, and fast food.  When eating at a restaurant, ask that your food be prepared with less salt or no salt, if possible. What foods are recommended? The items listed may not be a complete list. Talk with your dietitian about what dietary choices are best for you. Grains Whole-grain or whole-wheat bread. Whole-grain or whole-wheat pasta. Brown rice. [place]. Bulgur. Whole-grain and low-sodium cereals. Pita bread. Low-fat, low-sodium crackers. Whole-wheat flour tortillas. Vegetables Fresh or frozen vegetables (raw, steamed, roasted, or grilled). Low-sodium or reduced-sodium tomato and vegetable juice. Low-sodium or reduced-sodium tomato sauce and tomato paste. Low-sodium or reduced-sodium canned vegetables. Fruits All fresh, dried, or frozen fruit. Canned fruit in natural juice (without added sugar). Meat and other protein foods Skinless chicken or [place]. Ground chicken or [place]. Pork with fat trimmed off. Fish and seafood. Egg whites. Dried beans, peas, or  lentils. Unsalted nuts, nut butters, and seeds. Unsalted canned beans. Lean cuts of beef with fat trimmed off. Low-sodium, lean deli meat. Dairy Low-fat (1%) or fat-free (skim) milk. Fat-free, low-fat, or reduced-fat cheeses. Nonfat, low-sodium ricotta or cottage cheese. Low-fat or nonfat yogurt. Low-fat, low-sodium cheese. Fats and oils Soft margarine without trans fats. Vegetable oil. Low-fat, reduced-fat, or light mayonnaise and salad dressings (reduced-sodium). Canola, safflower, olive, soybean, and sunflower oils. Avocado. Seasoning and other foods Herbs. Spices. Seasoning mixes without salt. Unsalted popcorn and pretzels. Fat-free sweets. What foods are not recommended? The items  listed may not be a complete list. Talk with your dietitian about what dietary choices are best for you. Grains Baked goods made with fat, such as croissants, muffins, or some breads. Dry pasta or rice meal packs. Vegetables Creamed or fried vegetables. Vegetables in a cheese sauce. Regular canned vegetables (not low-sodium or reduced-sodium). Regular canned tomato sauce and paste (not low-sodium or reduced-sodium). Regular tomato and vegetable juice (not low-sodium or reduced-sodium). Angie Fava. Olives. Fruits Canned fruit in a light or heavy syrup. Fried fruit. Fruit in cream or butter sauce. Meat and other protein foods Fatty cuts of meat. Ribs. Fried meat. Berniece Salines. Sausage. Bologna and other processed lunch meats. Salami. Fatback. Hotdogs. Bratwurst. Salted nuts and seeds. Canned beans with added salt. Canned or smoked fish. Whole eggs or egg yolks. Chicken or [place] with skin. Dairy Whole or 2% milk, cream, and half-and-half. Whole or full-fat cream cheese. Whole-fat or sweetened yogurt. Full-fat cheese. Nondairy creamers. Whipped toppings. Processed cheese and cheese spreads. Fats and oils Butter. Stick margarine. Lard. Shortening. Ghee. Bacon fat. Tropical oils, such as coconut, palm kernel, or palm  oil. Seasoning and other foods Salted popcorn and pretzels. Onion salt, garlic salt, seasoned salt, table salt, and sea salt. Worcestershire sauce. Tartar sauce. Barbecue sauce. Teriyaki sauce. Soy sauce, including reduced-sodium. Steak sauce. Canned and packaged gravies. Fish sauce. Oyster sauce. Cocktail sauce. Horseradish that you find on the shelf. Ketchup. Mustard. Meat flavorings and tenderizers. Bouillon cubes. Hot sauce and Tabasco sauce. Premade or packaged marinades. Premade or packaged taco seasonings. Relishes. Regular salad dressings. Where to find more information:  National Heart, Lung, and [place]: https://wilson-eaton.com/  American Heart Association: www.heart.org Summary  The DASH eating plan is a healthy eating plan that has been shown to reduce high blood pressure (hypertension). It may also reduce your risk for type 2 diabetes, heart disease, and stroke.  With the DASH eating plan, you should limit salt (sodium) intake to 2,300 mg a day. If you have hypertension, you may need to reduce your sodium intake to 1,500 mg a day.  When on the DASH eating plan, aim to eat more fresh fruits and vegetables, whole grains, lean proteins, low-fat dairy, and heart-healthy fats.  Work with your health care provider or diet and nutrition specialist (dietitian) to adjust your eating plan to your individual calorie needs. This information is not intended to replace advice given to you by your health care provider. Make sure you discuss any questions you have with your health care provider. Document Released: 04/22/2011 Document Revised: 04/26/2016 Document Reviewed: 04/26/2016 Elsevier Interactive Patient Education  [employer].

## 2018-03-30 NOTE — Assessment & Plan Note (Signed)
Followed by rheumatology every 6 months.  Continue to collaborate with providers.

## 2018-03-30 NOTE — Assessment & Plan Note (Addendum)
Chronic, goal <130/90.  At goal today and on home BP.  Continue current regimen.  CMP and CBC today.

## 2018-03-30 NOTE — Assessment & Plan Note (Signed)
Issue resolved, no longer taking Montelukast.

## 2018-03-31 LAB — CBC WITH DIFFERENTIAL/PLATELET
Basophils Absolute: 0 10*3/uL (ref 0.0–0.2)
Basos: 1 %
EOS (ABSOLUTE): 0.1 10*3/uL (ref 0.0–0.4)
Eos: 2 %
HEMATOCRIT: 41.4 % (ref 34.0–46.6)
Hemoglobin: 13.6 g/dL (ref 11.1–15.9)
IMMATURE GRANS (ABS): 0 10*3/uL (ref 0.0–0.1)
IMMATURE GRANULOCYTES: 0 %
LYMPHS: 33 %
Lymphocytes Absolute: 2.1 10*3/uL (ref 0.7–3.1)
MCH: 29.2 pg (ref 26.6–33.0)
MCHC: 32.9 g/dL (ref 31.5–35.7)
MCV: 89 fL (ref 79–97)
MONOS ABS: 0.6 10*3/uL (ref 0.1–0.9)
Monocytes: 9 %
NEUTROS PCT: 55 %
Neutrophils Absolute: 3.5 10*3/uL (ref 1.4–7.0)
Platelets: 282 10*3/uL (ref 150–450)
RBC: 4.65 x10E6/uL (ref 3.77–5.28)
RDW: 14.6 % (ref 12.3–15.4)
WBC: 6.4 10*3/uL (ref 3.4–10.8)

## 2018-03-31 LAB — COMPREHENSIVE METABOLIC PANEL
A/G RATIO: 1.3 (ref 1.2–2.2)
ALT: 24 IU/L (ref 0–32)
AST: 19 IU/L (ref 0–40)
Albumin: 4.2 g/dL (ref 3.6–4.8)
Alkaline Phosphatase: 181 IU/L — ABNORMAL HIGH (ref 39–117)
BILIRUBIN TOTAL: 0.4 mg/dL (ref 0.0–1.2)
BUN/Creatinine Ratio: 17 (ref 12–28)
BUN: 15 mg/dL (ref 8–27)
CALCIUM: 9.6 mg/dL (ref 8.7–10.3)
CHLORIDE: 103 mmol/L (ref 96–106)
CO2: 23 mmol/L (ref 20–29)
Creatinine, Ser: 0.88 mg/dL (ref 0.57–1.00)
GFR, EST AFRICAN AMERICAN: 78 mL/min/{1.73_m2} (ref >59)
GFR, EST NON AFRICAN AMERICAN: 67 mL/min/{1.73_m2} (ref >59)
GLOBULIN, TOTAL: 3.2 g/dL (ref 1.5–4.5)
Glucose: 84 mg/dL (ref 65–99)
POTASSIUM: 4.3 mmol/L (ref 3.5–5.2)
SODIUM: 142 mmol/L (ref 134–144)
Total Protein: 7.4 g/dL (ref 6.0–8.5)

## 2018-03-31 LAB — LIPID PANEL W/O CHOL/HDL RATIO
Cholesterol, Total: 175 mg/dL (ref 100–199)
HDL: 58 mg/dL (ref >39)
LDL Calculated: 108 mg/dL — ABNORMAL HIGH (ref 0–99)
Triglycerides: 47 mg/dL (ref 0–149)
VLDL CHOLESTEROL CAL: 9 mg/dL (ref 5–40)

## 2018-03-31 LAB — TSH: TSH: 1.86 u[IU]/mL (ref 0.450–4.500)

## 2018-04-03 ENCOUNTER — Other Ambulatory Visit: Payer: Self-pay

## 2018-04-24 DIAGNOSIS — H2513 Age-related nuclear cataract, bilateral: Secondary | ICD-10-CM | POA: Diagnosis not present

## 2018-05-19 DIAGNOSIS — Z86010 Personal history of colonic polyps: Secondary | ICD-10-CM | POA: Diagnosis not present

## 2018-05-29 ENCOUNTER — Telehealth: Payer: Self-pay | Admitting: Nurse Practitioner

## 2018-05-29 NOTE — Telephone Encounter (Signed)
Copied from [place] [id number]. Topic: Medicare AWV >> May 29, 2018 11:21 AM Sherren Kerns wrote: Called to schedule Medicare Annual Wellness Visit with the Nurse Health Advisor.   If patient returns call, please note: their last AWV was on 03/23/2018 please schedule AWV with [facility] any date AFTER 03/24/2019.  Thank you! For any questions please contact: Janace Hoard at [phone] or Skype [email or web address]

## 2018-05-29 NOTE — Telephone Encounter (Signed)
Spoke with patient. Scheduled AWV-s for 03/26/2019 at 11:00 am,

## 2018-06-06 ENCOUNTER — Other Ambulatory Visit: Payer: Self-pay | Admitting: Unknown Physician Specialty

## 2018-06-10 ENCOUNTER — Other Ambulatory Visit: Payer: Self-pay | Admitting: Unknown Physician Specialty

## 2018-06-12 NOTE — Telephone Encounter (Signed)
Requested medication (s) are due for refill today: yes  Requested medication (s) are on the active medication list: yes  Last refill:  03/13/18 for 90 tabs  Future visit scheduled: yes  Notes to clinic:  Statins failed  Requested Prescriptions  Pending Prescriptions Disp Refills   atorvastatin (LIPITOR) 20 MG tablet [Pharmacy Med Name: Atorvastatin Calcium 20 MG Oral Tablet] 90 tablet 0    Sig: TAKE 1 TABLET BY MOUTH ONCE DAILY     Cardiovascular:  Antilipid - Statins Failed - 06/10/2018  4:06 PM      Failed - LDL in normal range and within 360 days    LDL Calculated  Date Value Ref Range Status  03/30/2018 108 (H) 0 - 99 mg/dL Final         Passed - Total Cholesterol in normal range and within 360 days    Cholesterol, Total  Date Value Ref Range Status  03/30/2018 175 100 - 199 mg/dL Final         Passed - HDL in normal range and within 360 days    HDL  Date Value Ref Range Status  03/30/2018 58 >39 mg/dL Final         Passed - Triglycerides in normal range and within 360 days    Triglycerides  Date Value Ref Range Status  03/30/2018 47 0 - 149 mg/dL Final         Passed - Patient is not pregnant      Passed - Valid encounter within last 12 months    Recent Outpatient Visits          2 months ago Annual physical exam   [place], Henrine Screws T, NP   5 months ago Pure hypercholesterolemia   [place] Carles Collet M, PA-C   8 months ago Essential hypertension   [place] Kathrine Haddock, NP   10 months ago Sore throat   [place] Kathrine Haddock, NP   1 year ago Annual physical exam   [facility] Kathrine Haddock, NP      Future Appointments            In 3 months [name], Barbaraann Faster, NP [facility] [facility], [facility]   In 76 months  [employer], [place]

## 2018-07-12 DIAGNOSIS — Z79899 Other long term (current) drug therapy: Secondary | ICD-10-CM | POA: Diagnosis not present

## 2018-07-12 DIAGNOSIS — M25552 Pain in left hip: Secondary | ICD-10-CM | POA: Diagnosis not present

## 2018-07-12 DIAGNOSIS — G8929 Other chronic pain: Secondary | ICD-10-CM | POA: Insufficient documentation

## 2018-07-12 DIAGNOSIS — M170 Bilateral primary osteoarthritis of knee: Secondary | ICD-10-CM | POA: Diagnosis not present

## 2018-07-12 DIAGNOSIS — M059 Rheumatoid arthritis with rheumatoid factor, unspecified: Secondary | ICD-10-CM | POA: Diagnosis not present

## 2018-07-13 ENCOUNTER — Other Ambulatory Visit: Payer: Self-pay | Admitting: Internal Medicine

## 2018-07-13 ENCOUNTER — Other Ambulatory Visit (HOSPITAL_COMMUNITY): Payer: Self-pay | Admitting: Internal Medicine

## 2018-07-13 DIAGNOSIS — G8929 Other chronic pain: Secondary | ICD-10-CM

## 2018-07-13 DIAGNOSIS — M25552 Pain in left hip: Principal | ICD-10-CM

## 2018-07-14 ENCOUNTER — Encounter: Payer: Self-pay | Admitting: *Deleted

## 2018-07-14 ENCOUNTER — Ambulatory Visit: Payer: Medicare Other | Admitting: Anesthesiology

## 2018-07-14 ENCOUNTER — Ambulatory Visit
Admission: RE | Admit: 2018-07-14 | Discharge: 2018-07-14 | Disposition: A | Discharge status: Home / Self Care | Payer: Medicare Other | Attending: Unknown Physician Specialty | Admitting: Unknown Physician Specialty

## 2018-07-14 ENCOUNTER — Encounter
Admission: RE | Disposition: A | Discharge status: Home / Self Care | Payer: Self-pay | Source: Home / Self Care | Attending: Unknown Physician Specialty

## 2018-07-14 DIAGNOSIS — E785 Hyperlipidemia, unspecified: Secondary | ICD-10-CM | POA: Diagnosis not present

## 2018-07-14 DIAGNOSIS — K640 First degree hemorrhoids: Secondary | ICD-10-CM | POA: Diagnosis not present

## 2018-07-14 DIAGNOSIS — K648 Other hemorrhoids: Secondary | ICD-10-CM | POA: Diagnosis not present

## 2018-07-14 DIAGNOSIS — Z79899 Other long term (current) drug therapy: Secondary | ICD-10-CM | POA: Insufficient documentation

## 2018-07-14 DIAGNOSIS — M069 Rheumatoid arthritis, unspecified: Secondary | ICD-10-CM | POA: Diagnosis not present

## 2018-07-14 DIAGNOSIS — Z7982 Long term (current) use of aspirin: Secondary | ICD-10-CM | POA: Insufficient documentation

## 2018-07-14 DIAGNOSIS — Z86010 Personal history of colonic polyps: Secondary | ICD-10-CM | POA: Diagnosis not present

## 2018-07-14 DIAGNOSIS — I10 Essential (primary) hypertension: Secondary | ICD-10-CM | POA: Insufficient documentation

## 2018-07-14 DIAGNOSIS — Z09 Encounter for follow-up examination after completed treatment for conditions other than malignant neoplasm: Secondary | ICD-10-CM | POA: Diagnosis not present

## 2018-07-14 DIAGNOSIS — K219 Gastro-esophageal reflux disease without esophagitis: Secondary | ICD-10-CM | POA: Diagnosis not present

## 2018-07-14 DIAGNOSIS — K5730 Diverticulosis of large intestine without perforation or abscess without bleeding: Secondary | ICD-10-CM | POA: Insufficient documentation

## 2018-07-14 DIAGNOSIS — Z1211 Encounter for screening for malignant neoplasm of colon: Secondary | ICD-10-CM | POA: Diagnosis not present

## 2018-07-14 HISTORY — DX: Rheumatoid arthritis, unspecified: M06.9

## 2018-07-14 HISTORY — PX: COLONOSCOPY WITH PROPOFOL: SHX5780

## 2018-07-14 HISTORY — DX: Benign neoplasm of colon, unspecified: D12.6

## 2018-07-14 HISTORY — DX: Psoriasis, unspecified: L40.9

## 2018-07-14 HISTORY — DX: Abscess of bursa, left hip: M71.052

## 2018-07-14 SURGERY — COLONOSCOPY WITH PROPOFOL
Anesthesia: General

## 2018-07-14 MED ORDER — SODIUM CHLORIDE 0.9 % IV SOLN: INTRAVENOUS | Status: DC

## 2018-07-14 MED ORDER — LIDOCAINE HCL (CARDIAC) PF 100 MG/5ML IV SOSY
PREFILLED_SYRINGE | INTRAVENOUS | Status: DC | PRN
  Administered 2018-07-14: 30 mg via INTRAVENOUS

## 2018-07-14 MED ORDER — PROPOFOL 500 MG/50ML IV EMUL
INTRAVENOUS | Status: DC | PRN
  Administered 2018-07-14: 125 ug/kg/min via INTRAVENOUS

## 2018-07-14 MED ORDER — SODIUM CHLORIDE 0.9 % IV SOLN
INTRAVENOUS | Status: DC
  Administered 2018-07-14: 1000 mL via INTRAVENOUS

## 2018-07-14 MED ORDER — PROPOFOL 10 MG/ML IV BOLUS
INTRAVENOUS | Status: DC | PRN
  Administered 2018-07-14 (×2): 50 mg via INTRAVENOUS

## 2018-07-14 MED ORDER — PROPOFOL 500 MG/50ML IV EMUL
INTRAVENOUS | Status: AC
  Filled 2018-07-14: qty 50

## 2018-07-14 NOTE — Anesthesia Postprocedure Evaluation (Signed)
Anesthesia Post Note  Patient: Angela Watts  Procedure(s) Performed: COLONOSCOPY WITH PROPOFOL (N/A )  Patient location during evaluation: Endoscopy Anesthesia Type: General Level of consciousness: awake and alert Pain management: pain level controlled Vital Signs Assessment: post-procedure vital signs reviewed and stable Respiratory status: spontaneous breathing, nonlabored ventilation, respiratory function stable and patient connected to nasal cannula oxygen Cardiovascular status: blood pressure returned to baseline and stable Postop Assessment: no apparent nausea or vomiting Anesthetic complications: no     Last Vitals:  Vitals:   07/14/18 1000 07/14/18 1010  BP: 120/77 128/77  Pulse: 79 68  Resp: 20 20  Temp:    SpO2: 100% 100%    Last Pain:  Vitals:   07/14/18 0930  TempSrc: Tympanic  PainSc:                  Martha Clan

## 2018-07-14 NOTE — H&P (Signed)
Primary Care Physician:  Venita Lick, NP Primary Gastroenterologist:  Dr. Vira Agar  Pre-Procedure History & Physical: HPI:  Angela Watts is a 68 y.o. female is here for an colonoscopy.   Past Medical History:  Diagnosis Date  . Abnormal Pap smear of cervix   . Abscess of bursa, left hip   . Arthritis   . Colon adenomas   . GERD (gastroesophageal reflux disease)   . Hyperlipidemia   . Hypertension   . Psoriasis   . Rheumatoid arthritis flare (HCC)     Past Surgical History:  Procedure Laterality Date  . ABDOMINAL HYSTERECTOMY  2003  . BREAST CYST ASPIRATION Left 2011   NEG/ [facility]  . COLONOSCOPY WITH PROPOFOL N/A 06/11/2015   Procedure: COLONOSCOPY WITH PROPOFOL;  Surgeon: Manya Silvas, MD;  Location: [facility];  Service: Endoscopy;  Laterality: N/A;  . CYST EXCISION Left    breast    Prior to Admission medications   Medication Sig Start Date End Date Taking? Authorizing Provider  amLODipine (NORVASC) 5 MG tablet TAKE 1 TABLET BY MOUTH ONCE DAILY 06/06/18  Yes Kathrine Haddock, NP  Aspirin-Calcium Carbonate [phone] MG TABS Take by mouth.   Yes [provider]  aspirin 81 MG tablet Take 81 mg by mouth daily.    [provider]  atorvastatin (LIPITOR) 20 MG tablet TAKE 1 TABLET BY MOUTH ONCE DAILY 06/12/18   [name], Henrine Screws T, NP  Ca Phosphate-Cholecalciferol (CALTRATE GUMMY BITES) 250-400 MG-UNIT CHEW Chew by mouth daily.    [provider]  folic acid (FOLVITE) 1 MG tablet Take 1 tablet by mouth daily. 08/03/17   [provider]  methotrexate (RHEUMATREX) 2.5 MG tablet Take 7 tabs (17.5 MG ) once a week for 12 weeks 02/02/18   [provider]  Multiple Vitamin (MULTIVITAMIN) tablet Take 1 tablet by mouth daily.    [provider]    Allergies as of 05/25/2018 - Review Complete 03/30/2018  Allergen Reaction Noted  . Prednisone Itching 03/23/2018    Family History  Problem Relation Age of Onset  .  Hypertension Mother   . Arthritis Mother   . Breast cancer Mother   . Cancer Mother        breast  . Hypertension Father   . Lung cancer Father   . Diabetes Sister   . Hypertension Sister   . Hypertension Brother   . Hypertension Sister   . Hypertension Sister   . Hypertension Sister   . Hypertension Brother   . Cancer Brother        lung    Social History   Socioeconomic History  . Marital status: Single    Spouse name: Not on file  . Number of children: Not on file  . Years of education: Not on file  . Highest education level: Not on file  Occupational History  . Not on file  Social Needs  . Financial resource strain: Not hard at all  . Food insecurity:    Worry: Never true    Inability: Never true  . Transportation needs:    Medical: No    Non-medical: No  Tobacco Use  . Smoking status: Never Smoker  . Smokeless tobacco: Never Used  Substance and Sexual Activity  . Alcohol use: Yes    Alcohol/week: 0.0 standard drinks    Comment: on occasion/socially  . Drug use: No  . Sexual activity: Never  Lifestyle  . Physical activity:    Days per  week: 0 days    Minutes per session: 0 min  . Stress: Only a little  Relationships  . Social connections:    Talks on phone: More than three times a week    Gets together: More than three times a week    Attends religious service: More than 4 times per year    Active member of club or organization: No    Attends meetings of clubs or organizations: Never    Relationship status: Never married  . Intimate partner violence:    Fear of current or ex partner: No    Emotionally abused: No    Physically abused: No    Forced sexual activity: No  Other Topics Concern  . Not on file  Social History Narrative  . Not on file    Review of Systems: See HPI, otherwise negative ROS  Physical Exam: BP (!) 145/78   Pulse 79   Temp (!) 97.2 F (36.2 C) (Tympanic)   Resp 18   Ht 5\' 6"  (1.676 m)   Wt 106.1 kg   LMP  (LMP  Unknown)   SpO2 100%   BMI 37.77 kg/m  General:   Alert,  pleasant and cooperative in NAD Head:  Normocephalic and atraumatic. Neck:  Supple; no masses or thyromegaly. Lungs:  Clear throughout to auscultation.    Heart:  Regular rate and rhythm. Abdomen:  Soft, nontender and nondistended. Normal bowel sounds, without guarding, and without rebound.   Neurologic:  Alert and  oriented x4;  grossly normal neurologically.  Impression/Plan: Angela Watts is here for an colonoscopy to be performed for [facility] colon polyps.  Risks, benefits, limitations, and alternatives regarding  colonoscopy have been reviewed with the patient.  Questions have been answered.  All parties agreeable.   Gaylyn Cheers, MD  07/14/2018, 9:00 AM

## 2018-07-14 NOTE — Anesthesia Preprocedure Evaluation (Signed)
Anesthesia Evaluation  Patient identified by MRN, date of birth, ID band Patient awake    Reviewed: Allergy & Precautions, NPO status , Patient's Chart, lab work & pertinent test results  History of Anesthesia Complications (+) MALIGNANT HYPERTHERMIANegative for: history of anesthetic complications  Airway Mallampati: II       Dental  (+) Teeth Intact, Caps, Dental Advidsory Given   Pulmonary neg pulmonary ROS,     + decreased breath sounds      Cardiovascular Exercise Tolerance: Good hypertension, Pt. on medications (-) angina(-) CAD, (-) Past MI, (-) Cardiac Stents and (-) CABG (-) dysrhythmias (-) Valvular Problems/Murmurs     Neuro/Psych negative neurological ROS  negative psych ROS   GI/Hepatic Neg liver ROS, GERD  ,  Endo/Other  negative endocrine ROS  Renal/GU negative Renal ROS     Musculoskeletal   Abdominal (+) + obese,   Peds  Hematology   Anesthesia Other Findings Past Medical History: No date: Abnormal Pap smear of cervix No date: Abscess of bursa, left hip No date: Arthritis No date: Colon adenomas No date: GERD (gastroesophageal reflux disease) No date: Hyperlipidemia No date: Hypertension No date: Psoriasis No date: Rheumatoid arthritis flare (HCC)   Reproductive/Obstetrics                             Anesthesia Physical  Anesthesia Plan  ASA: II  Anesthesia Plan: General   Post-op Pain Management:    Induction: Intravenous  PONV Risk Score and Plan: 3 and Propofol infusion and TIVA  Airway Management Planned: Natural Airway and Nasal Cannula  Additional Equipment:   Intra-op Plan:   Post-operative Plan:   Informed Consent: I have reviewed the patients History and Physical, chart, labs and discussed the procedure including the risks, benefits and alternatives for the proposed anesthesia with the patient or authorized representative who has indicated  his/her understanding and acceptance.       Plan Discussed with:   Anesthesia Plan Comments:         Anesthesia Quick Evaluation

## 2018-07-14 NOTE — Op Note (Signed)
[facility] Gastroenterology Patient Name: Angela Watts Procedure Date: 07/14/2018 9:01 AM MRN: [id number] Account #: [id number] Date of Birth: 10-Feb-1951 Admit Type: Outpatient Age: 68 Room: [facility] ENDO ROOM 1 Gender: Female Note Status: Finalized Procedure:            Colonoscopy Indications:          High risk colon cancer surveillance: Personal history                        of colonic polyps Providers:            Manya Silvas, MD Referring MD:         No Local Md, MD (Referring MD) Medicines:            Propofol per Anesthesia Complications:        No immediate complications. Procedure:            Pre-Anesthesia Assessment:                       - After reviewing the risks and benefits, the patient                        was deemed in satisfactory condition to undergo the                        procedure.                       After obtaining informed consent, the colonoscope was                        passed under direct vision. Throughout the procedure,                        the patient's blood pressure, pulse, and oxygen                        saturations were monitored continuously. The                        Colonoscope was introduced through the anus and                        advanced to the the cecum, identified by appendiceal                        orifice and ileocecal valve. The colonoscopy was                        performed without difficulty. The patient tolerated the                        procedure well. The quality of the bowel preparation                        was good. Findings:      A few small-mouthed diverticula were found in the sigmoid colon.      Internal hemorrhoids were found during endoscopy. The hemorrhoids were       small and Grade I (internal hemorrhoids that do not prolapse).      The exam was  otherwise without abnormality. Impression:           - Diverticulosis in the sigmoid colon.                       -  Internal hemorrhoids.                       - The examination was otherwise normal.                       - No specimens collected. Recommendation:       - Repeat colonoscopy in 5 years for surveillance. Manya Silvas, MD 07/14/2018 9:35:19 AM This report has been signed electronically. Number of Addenda: 0 Note Initiated On: 07/14/2018 9:01 AM Scope Withdrawal Time: 0 hours 10 minutes 52 seconds  Total Procedure Duration: 0 hours 19 minutes 42 seconds       [facility]

## 2018-07-14 NOTE — Transfer of Care (Signed)
Immediate Anesthesia Transfer of Care Note  Patient: Angela Watts  Procedure(s) Performed: COLONOSCOPY WITH PROPOFOL (N/A )  Patient Location: PACU and Endoscopy Unit  Anesthesia Type:General  Level of Consciousness: awake, alert  and oriented  Airway & Oxygen Therapy: Patient Spontanous Breathing  Post-op Assessment: Report given to RN and Post -op Vital signs reviewed and stable  Post vital signs: Reviewed and stable  Last Vitals:  Vitals Value Taken Time  BP    Temp    Pulse    Resp    SpO2      Last Pain:  Vitals:   07/14/18 0808  TempSrc: Tympanic  PainSc: 0-No pain         Complications: No apparent anesthesia complications

## 2018-07-14 NOTE — Anesthesia Post-op Follow-up Note (Signed)
Anesthesia QCDR form completed.        

## 2018-07-24 ENCOUNTER — Other Ambulatory Visit: Payer: Self-pay

## 2018-07-24 ENCOUNTER — Ambulatory Visit
Admission: RE | Admit: 2018-07-24 | Discharge: 2018-07-24 | Disposition: A | Discharge status: Home / Self Care | Payer: Medicare Other | Source: Ambulatory Visit | Attending: Internal Medicine | Admitting: Internal Medicine

## 2018-07-24 DIAGNOSIS — M25552 Pain in left hip: Secondary | ICD-10-CM | POA: Insufficient documentation

## 2018-07-24 DIAGNOSIS — G8929 Other chronic pain: Secondary | ICD-10-CM | POA: Diagnosis not present

## 2018-07-24 DIAGNOSIS — M1612 Unilateral primary osteoarthritis, left hip: Secondary | ICD-10-CM | POA: Diagnosis not present

## 2018-09-04 ENCOUNTER — Other Ambulatory Visit: Payer: Self-pay | Admitting: Unknown Physician Specialty

## 2018-09-04 NOTE — Telephone Encounter (Signed)
Requested Prescriptions  Pending Prescriptions Disp Refills  . amLODipine (NORVASC) 5 MG tablet [Pharmacy Med Name: amLODIPine Besylate 5 MG Oral Tablet] 90 tablet 0    Sig: Take 1 tablet by mouth once daily     Cardiovascular:  Calcium Channel Blockers Passed - 09/04/2018  3:28 PM      Passed - Last BP in normal range    BP Readings from Last 1 Encounters:  07/14/18 128/77         Passed - Valid encounter within last 6 months    Recent Outpatient Visits          5 months ago Annual physical exam   [facility] [place], Henrine Screws T, NP   8 months ago Pure hypercholesterolemia   [facility] Carles Collet M, [place]   11 months ago Essential hypertension   [place] Kathrine Haddock, NP   1 year ago Sore throat   [place] Kathrine Haddock, NP   1 year ago Annual physical exam   [facility] Kathrine Haddock, NP      Future Appointments            In 3 weeks [name], Barbaraann Faster, NP [employer] [facility]   In 6 months  [employer], [place]

## 2018-09-09 ENCOUNTER — Other Ambulatory Visit: Payer: Self-pay | Admitting: Nurse Practitioner

## 2018-09-29 ENCOUNTER — Other Ambulatory Visit: Payer: Self-pay

## 2018-09-29 ENCOUNTER — Ambulatory Visit (INDEPENDENT_AMBULATORY_CARE_PROVIDER_SITE_OTHER): Payer: Medicare Other | Admitting: Nurse Practitioner

## 2018-09-29 ENCOUNTER — Encounter: Payer: Self-pay | Admitting: Nurse Practitioner

## 2018-09-29 VITALS — BP 129/75 | HR 73 | Temp 98.8°F | Ht 66.0 in | Wt 239.0 lb

## 2018-09-29 DIAGNOSIS — I10 Essential (primary) hypertension: Secondary | ICD-10-CM | POA: Diagnosis not present

## 2018-09-29 DIAGNOSIS — E7800 Pure hypercholesterolemia, unspecified: Secondary | ICD-10-CM

## 2018-09-29 DIAGNOSIS — R079 Chest pain, unspecified: Secondary | ICD-10-CM | POA: Insufficient documentation

## 2018-09-29 DIAGNOSIS — R0789 Other chest pain: Secondary | ICD-10-CM

## 2018-09-29 MED ORDER — HYDROCHLOROTHIAZIDE 12.5 MG PO TABS: 12.5000 mg | ORAL_TABLET | Freq: Every day | ORAL | 3 refills | Status: DC

## 2018-09-29 MED ORDER — ROSUVASTATIN CALCIUM 20 MG PO TABS: ORAL_TABLET | ORAL | 2 refills | Status: DC

## 2018-09-29 NOTE — Assessment & Plan Note (Signed)
Chronic, ongoing.  At goal BP today and at home.  Will trial adding a small dose HCTZ to regimen (12.5 MG).  Continue Amlodipine.  Recommend decrease sodium intake.  Return in one month.

## 2018-09-29 NOTE — Assessment & Plan Note (Signed)
X 2 episodes in morning over past 3 months, short episodes with no other symptoms.  EKG within normal limits.  Have recommend trial of TUMS with these episodes, as has h/o GERD.  Recommend if increased episodes then return to office immediately.  Discussed that if other symptoms present with episodes, such as diaphoresis, N&V, radiation of pain, headaches then she is to go to ER immediately.

## 2018-09-29 NOTE — Assessment & Plan Note (Signed)
Chronic, ongoing.  Discussed LDL goal < 70 for stroke prevention.  Will trial change from Lipitor to Rosuvastatin 20 MG 3 days a week, due to ongoing issues with persistent leg cramps (which in past improved with trial off Lipitor).  Educated on Rosuvastatin.  Lipid panel today and return in one month.  She is fasting today.

## 2018-09-29 NOTE — Patient Instructions (Signed)
DASH Eating Plan DASH stands for "Dietary Approaches to Stop Hypertension." The DASH eating plan is a healthy eating plan that has been shown to reduce high blood pressure (hypertension). It may also reduce your risk for type 2 diabetes, heart disease, and stroke. The DASH eating plan may also help with weight loss. What are tips for following this plan?  General guidelines  Avoid eating more than 2,300 mg (milligrams) of salt (sodium) a day. If you have hypertension, you may need to reduce your sodium intake to 1,500 mg a day.  Limit alcohol intake to no more than 1 drink a day for nonpregnant women and 2 drinks a day for men. One drink equals 12 oz of beer, 5 oz of wine, or 1 oz of hard liquor.  Work with your health care provider to maintain a healthy body weight or to lose weight. Ask what an ideal weight is for you.  Get at least 30 minutes of exercise that causes your heart to beat faster (aerobic exercise) most days of the week. Activities may include walking, swimming, or biking.  Work with your health care provider or diet and nutrition specialist (dietitian) to adjust your eating plan to your individual calorie needs. Reading food labels   Check food labels for the amount of sodium per serving. Choose foods with less than 5 percent of the Daily Value of sodium. Generally, foods with less than 300 mg of sodium per serving fit into this eating plan.  To find whole grains, look for the word "whole" as the first word in the ingredient list. Shopping  Buy products labeled as "low-sodium" or "no salt added."  Buy fresh foods. Avoid canned foods and premade or frozen meals. Cooking  Avoid adding salt when cooking. Use salt-free seasonings or herbs instead of table salt or sea salt. Check with your health care provider or pharmacist before using salt substitutes.  Do not fry foods. Cook foods using healthy methods such as baking, boiling, grilling, and broiling instead.  Cook with  heart-healthy oils, such as olive, canola, soybean, or sunflower oil. Meal planning  Eat a balanced diet that includes: ? 5 or more servings of fruits and vegetables each day. At each meal, try to fill half of your plate with fruits and vegetables. ? Up to 6-8 servings of whole grains each day. ? Less than 6 oz of lean meat, poultry, or fish each day. A 3-oz serving of meat is about the same size as a deck of cards. One egg equals 1 oz. ? 2 servings of low-fat dairy each day. ? A serving of nuts, seeds, or beans 5 times each week. ? Heart-healthy fats. Healthy fats called Omega-3 fatty acids are found in foods such as flaxseeds and coldwater fish, like sardines, salmon, and mackerel.  Limit how much you eat of the following: ? Canned or prepackaged foods. ? Food that is high in trans fat, such as fried foods. ? Food that is high in saturated fat, such as fatty meat. ? Sweets, desserts, sugary drinks, and other foods with added sugar. ? Full-fat dairy products.  Do not salt foods before eating.  Try to eat at least 2 vegetarian meals each week.  Eat more home-cooked food and less restaurant, buffet, and fast food.  When eating at a restaurant, ask that your food be prepared with less salt or no salt, if possible. What foods are recommended? The items listed may not be a complete list. Talk with your dietitian about   what dietary choices are best for you. Grains Whole-grain or whole-wheat bread. Whole-grain or whole-wheat pasta. Brown rice. Oatmeal. Quinoa. Bulgur. Whole-grain and low-sodium cereals. Pita bread. Low-fat, low-sodium crackers. Whole-wheat flour tortillas. Vegetables Fresh or frozen vegetables (raw, steamed, roasted, or grilled). Low-sodium or reduced-sodium tomato and vegetable juice. Low-sodium or reduced-sodium tomato sauce and tomato paste. Low-sodium or reduced-sodium canned vegetables. Fruits All fresh, dried, or frozen fruit. Canned fruit in natural juice (without  added sugar). Meat and other protein foods Skinless chicken or turkey. Ground chicken or turkey. Pork with fat trimmed off. Fish and seafood. Egg whites. Dried beans, peas, or lentils. Unsalted nuts, nut butters, and seeds. Unsalted canned beans. Lean cuts of beef with fat trimmed off. Low-sodium, lean deli meat. Dairy Low-fat (1%) or fat-free (skim) milk. Fat-free, low-fat, or reduced-fat cheeses. Nonfat, low-sodium ricotta or cottage cheese. Low-fat or nonfat yogurt. Low-fat, low-sodium cheese. Fats and oils Soft margarine without trans fats. Vegetable oil. Low-fat, reduced-fat, or light mayonnaise and salad dressings (reduced-sodium). Canola, safflower, olive, soybean, and sunflower oils. Avocado. Seasoning and other foods Herbs. Spices. Seasoning mixes without salt. Unsalted popcorn and pretzels. Fat-free sweets. What foods are not recommended? The items listed may not be a complete list. Talk with your dietitian about what dietary choices are best for you. Grains Baked goods made with fat, such as croissants, muffins, or some breads. Dry pasta or rice meal packs. Vegetables Creamed or fried vegetables. Vegetables in a cheese sauce. Regular canned vegetables (not low-sodium or reduced-sodium). Regular canned tomato sauce and paste (not low-sodium or reduced-sodium). Regular tomato and vegetable juice (not low-sodium or reduced-sodium). Pickles. Olives. Fruits Canned fruit in a light or heavy syrup. Fried fruit. Fruit in cream or butter sauce. Meat and other protein foods Fatty cuts of meat. Ribs. Fried meat. Bacon. Sausage. Bologna and other processed lunch meats. Salami. Fatback. Hotdogs. Bratwurst. Salted nuts and seeds. Canned beans with added salt. Canned or smoked fish. Whole eggs or egg yolks. Chicken or turkey with skin. Dairy Whole or 2% milk, cream, and half-and-half. Whole or full-fat cream cheese. Whole-fat or sweetened yogurt. Full-fat cheese. Nondairy creamers. Whipped toppings.  Processed cheese and cheese spreads. Fats and oils Butter. Stick margarine. Lard. Shortening. Ghee. Bacon fat. Tropical oils, such as coconut, palm kernel, or palm oil. Seasoning and other foods Salted popcorn and pretzels. Onion salt, garlic salt, seasoned salt, table salt, and sea salt. Worcestershire sauce. Tartar sauce. Barbecue sauce. Teriyaki sauce. Soy sauce, including reduced-sodium. Steak sauce. Canned and packaged gravies. Fish sauce. Oyster sauce. Cocktail sauce. Horseradish that you find on the shelf. Ketchup. Mustard. Meat flavorings and tenderizers. Bouillon cubes. Hot sauce and Tabasco sauce. Premade or packaged marinades. Premade or packaged taco seasonings. Relishes. Regular salad dressings. Where to find more information:  National Heart, Lung, and Blood Institute: www.nhlbi.nih.gov  American Heart Association: www.heart.org Summary  The DASH eating plan is a healthy eating plan that has been shown to reduce high blood pressure (hypertension). It may also reduce your risk for type 2 diabetes, heart disease, and stroke.  With the DASH eating plan, you should limit salt (sodium) intake to 2,300 mg a day. If you have hypertension, you may need to reduce your sodium intake to 1,500 mg a day.  When on the DASH eating plan, aim to eat more fresh fruits and vegetables, whole grains, lean proteins, low-fat dairy, and heart-healthy fats.  Work with your health care provider or diet and nutrition specialist (dietitian) to adjust your eating plan to your   individual calorie needs. This information is not intended to replace advice given to you by your health care provider. Make sure you discuss any questions you have with your health care provider. Document Released: 04/22/2011 Document Revised: 04/26/2016 Document Reviewed: 04/26/2016 Elsevier Interactive Patient Education  2019 Else[employer] Inc. Rosuvastatin capsules What is this medicine? ROSUVASTATIN (roe SOO va sta tin) is known as an  HMG-CoA reductase inhibitor or 'statin'. It lowers cholesterol and triglycerides in the blood. Diet and lifestyle changes are often used with this drug. This medicine may be used for other purposes; ask your health care provider or pharmacist if you have questions. COMMON BRAND NAME(S): Ezallor What should I tell my health care provider before I take this medicine? They need to know if you have any of these conditions: -diabetes -if you often drink alcohol -history of stroke -kidney disease -liver disease -muscle aches or weakness -thyroid disease -an unusual or allergic reaction to rosuvastatin, other medicines, foods, dyes, or preservatives -pregnant or trying to get pregnant -breast-feeding How should I use this medicine? Take this medicine by mouth with a glass of water. Follow the directions on the prescription label. Swallow the capsules whole. Do not crush or chew this medicine. You may open the capsule and put the contents in 1 teaspoon of applesauce. Swallow the medicine and applesauce right away. Do not chew the medicine or applesauce. You can take this medicine with or without food. Take your doses at regular intervals. Do not take your medicine more often than directed. Talk to your pediatrician about the use of this medicine in children. Special care may be needed. Overdosage: If you think you have taken too much of this medicine contact a poison control center or emergency room at once. NOTE: This medicine is only for you. Do not share this medicine with others. What if I miss a dose? If you miss a dose, take it as soon as you can. If your next dose is to be taken in less than 12 hours, then do not take the missed dose. Take the next dose at your regular time. Do not take double or extra doses. What may interact with this medicine? Do not take this medicine with any of the following medications: -herbal medicines like red yeast rice This medicine may also interact with the  following medications: -alcohol -antacids containing aluminum hydroxide or magnesium hydroxide -cyclosporine -other medicines for high cholesterol -some medicines for HIV infection -warfarin This list may not describe all possible interactions. Give your health care provider a list of all the medicines, herbs, non-prescription drugs, or dietary supplements you use. Also tell them if you smoke, drink alcohol, or use illegal drugs. Some items may interact with your medicine. What should I watch for while using this medicine? Visit your doctor or health care professional for regular check-ups. You may need regular tests to make sure your liver is working properly. Your health care professional may tell you to stop taking this medicine if you develop muscle problems. If your muscle problems do not go away after stopping this medicine, contact your health care professional. Do not become pregnant while taking this medicine. Women should inform their health care professional if they wish to become pregnant or think they might be pregnant. There is a potential for serious side effects to an unborn child. Talk to your health care professional or pharmacist for more information. Do not breast-feed an infant while taking this medicine. This medicine may affect blood sugar levels. If you  have diabetes, check with your doctor or health care professional before you change your diet or the dose of your diabetic medicine. If you are going to need surgery or other procedure, tell your doctor that you are using this medicine. This drug is only part of a total heart-health program. Your doctor or a dietician can suggest a low-cholesterol and low-fat diet to help. Avoid alcohol and smoking, and keep a proper exercise schedule. This medicine may cause a decrease in Co-Enzyme Q-10. You should make sure that you get enough Co-Enzyme Q-10 while you are taking this medicine. Discuss the foods you eat and the vitamins you  take with your health care professional. What side effects may I notice from receiving this medicine? Side effects that you should report to your doctor or health care professional as soon as possible: -allergic reactions like skin rash, itching or hives, swelling of the face, lips, or tongue -dark urine -fever -joint pain -muscle cramps, pain -redness, blistering, peeling or loosening of the skin, including inside the mouth -trouble passing urine or change in the amount of urine -unusually weak or tired -yellowing of the eyes or skin Side effects that usually do not require medical attention (report these to your doctor or health care professional if they continue or are bothersome): -constipation -heartburn -nausea -stomach gas, pain, upset This list may not describe all possible side effects. Call your doctor for medical advice about side effects. You may report side effects to FDA at 1-800-FDA-1088. Where should I keep my medicine? Keep out of the reach of children. Store at room temperature between 20 and 25 degrees C (68 and 77 degrees F). Keep container tightly closed (protect from moisture). Throw away any unused medicine after the expiration date. NOTE: This sheet is a summary. It may not cover all possible information. If you have questions about this medicine, talk to your doctor, pharmacist, or health care provider.  2019 Elsevier/Gold Standard (2017-09-08 14:59:10)

## 2018-09-29 NOTE — Progress Notes (Signed)
BP 129/75   Pulse 73   Temp 98.8 F (37.1 C) (Oral)   Ht 5\' 6"  (1.676 m)   Wt 239 lb (108.4 kg)   LMP  (LMP Unknown)   SpO2 95%   BMI 38.58 kg/m    Subjective:    Patient ID: Angela Watts, female    DOB: March 01, 1951, 68 y.o.   MRN: [id number]  HPI: Angela Watts is a 68 y.o. female  Chief Complaint  Patient presents with  . Hypertension    77m f/u pt states she has had bilateral legs swelling x few weeks ago  . Hyperlipidemia   HYPERTENSION / HYPERLIPIDEMIA Currently taking Amlodipine 5 MG (has been on 1 1/2 years) and Lipitor 20 MG.  Reports since she has been home every day she has been noticing bilateral legs have been swollen at night, but in morning this is improved. States has been eating "a little more salty food".  Denies SOB or new onset cough, but has noticed a couple episodes of chest pain in past 3 months.  Episode of chest pain lasts only a few minutes, but goes away after taking all medication.  When episodes present she denies SOB, diaphoresis, N&V.  States notices episodes only in morning. Pain felt is in epigastric region and does not radiate anywhere else. Reports she has history of heartburn and these episodes "sort of feel like that, just a little tightness".  Has not taken any OTC TUMS for this discomfort.      Reports she has had ongoing leg cramps since being on Lipitor, did take a break at one time off of it and states this helped.  Reports the leg cramps are on and off all day.  Discussed trial of another statin, Crestor, taking three days a week.  She would like to try this vs taking Lipitor.   Satisfied with current treatment? yes Duration of hypertension: chronic BP monitoring frequency: rarely BP range: 130-140/70's range BP medication side effects: no Past BP meds: Losartan Duration of hyperlipidemia: chronic Cholesterol medication side effects: no Cholesterol supplements: none Past cholesterol medications: atorvastain (lipitor) Medication  compliance: good compliance Aspirin: yes Recent stressors: no Recurrent headaches: no Visual changes: no Palpitations: no Dyspnea: no Chest pain: yes Lower extremity edema: yes Dizzy/lightheaded: no  Syncope: no Anxiety/stress: no Nausea/vomiting: no Diaphoresis: no Coronary artery disease: no Congestive heart failure: no Arrhythmia:no Thyroid disease: no Caffeine intake: none  Relevant past medical, surgical, family and social history reviewed and updated as indicated. Interim medical history since our last visit reviewed. Allergies and medications reviewed and updated.  Review of Systems  Constitutional: Negative for activity change, appetite change, diaphoresis, fatigue and fever.  Respiratory: Negative for cough, chest tightness and shortness of breath.   Cardiovascular: Positive for chest pain (x 2 episodes in past 3 months) and leg swelling. Negative for palpitations.  Gastrointestinal: Negative for abdominal distention, abdominal pain, constipation, diarrhea, nausea and vomiting.  Endocrine: Negative for cold intolerance, heat intolerance, polydipsia, polyphagia and polyuria.  Neurological: Negative for dizziness, syncope, weakness, light-headedness, numbness and headaches.  Psychiatric/Behavioral: Negative.     Per HPI unless specifically indicated above     Objective:    BP 129/75   Pulse 73   Temp 98.8 F (37.1 C) (Oral)   Ht 5\' 6"  (1.676 m)   Wt 239 lb (108.4 kg)   LMP  (LMP Unknown)   SpO2 95%   BMI 38.58 kg/m   Wt Readings from Last 3 Encounters:  09/29/18 239 lb (108.4 kg)  07/14/18 234 lb (106.1 kg)  03/30/18 234 lb 9.6 oz (106.4 kg)    Physical Exam Vitals signs and nursing note reviewed.  Constitutional:      General: She is awake.     Appearance: She is well-developed.  HENT:     Head: Normocephalic.     Right Ear: Hearing normal.     Left Ear: Hearing normal.     Nose: Nose normal.     Mouth/Throat:     Mouth: Mucous membranes are  moist.  Eyes:     General: Lids are normal.        Right eye: No discharge.        Left eye: No discharge.     Conjunctiva/sclera: Conjunctivae normal.     Pupils: Pupils are equal, round, and reactive to light.  Neck:     Musculoskeletal: Normal range of motion and neck supple.     Thyroid: No thyromegaly.     Vascular: No carotid bruit or JVD.  Cardiovascular:     Rate and Rhythm: Normal rate and regular rhythm.     Heart sounds: Normal heart sounds. No murmur. No gallop.   Pulmonary:     Effort: Pulmonary effort is normal.     Breath sounds: Normal breath sounds.  Abdominal:     General: Bowel sounds are normal.     Palpations: Abdomen is soft. There is no hepatomegaly or splenomegaly.  Musculoskeletal:     Right lower leg: Edema (1+) present.     Left lower leg: Edema (1+) present.  Lymphadenopathy:     Cervical: No cervical adenopathy.  Skin:    General: Skin is warm and dry.  Neurological:     Mental Status: She is alert and oriented to person, place, and time.  Psychiatric:        Attention and Perception: Attention normal.        Mood and Affect: Mood normal.        Behavior: Behavior normal. Behavior is cooperative.        Thought Content: Thought content normal.        Judgment: Judgment normal.   EKG performed with rate 73, NSR with normal axis.    Results for orders placed or performed in visit on 03/30/18  CBC with Differential/Platelet  Result Value Ref Range   WBC 6.4 3.4 - 10.8 x10E3/uL   RBC 4.65 3.77 - 5.28 x10E6/uL   Hemoglobin 13.6 11.1 - 15.9 g/dL   Hematocrit 41.4 34.0 - 46.6 %   MCV 89 79 - 97 fL   MCH 29.2 26.6 - 33.0 pg   MCHC 32.9 31.5 - 35.7 g/dL   RDW 14.6 12.3 - 15.4 %   Platelets 282 150 - 450 x10E3/uL   Neutrophils 55 Not Estab. %   Lymphs 33 Not Estab. %   Monocytes 9 Not Estab. %   Eos 2 Not Estab. %   Basos 1 Not Estab. %   Neutrophils Absolute 3.5 1.4 - 7.0 x10E3/uL   Lymphocytes Absolute 2.1 0.7 - 3.1 x10E3/uL   Monocytes  Absolute 0.6 0.1 - 0.9 x10E3/uL   EOS (ABSOLUTE) 0.1 0.0 - 0.4 x10E3/uL   Basophils Absolute 0.0 0.0 - 0.2 x10E3/uL   Immature Granulocytes 0 Not Estab. %   Immature Grans (Abs) 0.0 0.0 - 0.1 x10E3/uL  Comprehensive metabolic panel  Result Value Ref Range   Glucose 84 65 - 99 mg/dL   BUN 15 8 -  27 mg/dL   Creatinine, Ser 0.88 0.57 - 1.00 mg/dL   GFR calc non Af Amer 67 >59 mL/min/1.73   GFR calc Af Amer 78 >59 mL/min/1.73   BUN/Creatinine Ratio 17 12 - 28   Sodium 142 134 - 144 mmol/L   Potassium 4.3 3.5 - 5.2 mmol/L   Chloride 103 96 - 106 mmol/L   CO2 23 20 - 29 mmol/L   Calcium 9.6 8.7 - 10.3 mg/dL   Total Protein 7.4 6.0 - 8.5 g/dL   Albumin 4.2 3.6 - 4.8 g/dL   Globulin, Total 3.2 1.5 - 4.5 g/dL   Albumin/Globulin Ratio 1.3 1.2 - 2.2   Bilirubin Total 0.4 0.0 - 1.2 mg/dL   Alkaline Phosphatase 181 (H) 39 - 117 IU/L   AST 19 0 - 40 IU/L   ALT 24 0 - 32 IU/L  Lipid Panel w/o Chol/HDL Ratio  Result Value Ref Range   Cholesterol, Total 175 100 - 199 mg/dL   Triglycerides 47 0 - 149 mg/dL   HDL 58 >39 mg/dL   VLDL Cholesterol Cal 9 5 - 40 mg/dL   LDL Calculated 108 (H) 0 - 99 mg/dL  TSH  Result Value Ref Range   TSH 1.860 0.450 - 4.500 uIU/mL      Assessment & Plan:   Problem List Items Addressed This Visit      Cardiovascular and Mediastinum   Hypertension    Chronic, ongoing.  At goal BP today and at home.  Will trial adding a small dose HCTZ to regimen (12.5 MG).  Continue Amlodipine.  Recommend decrease sodium intake.  Return in one month.      Relevant Medications   rosuvastatin (CRESTOR) 20 MG tablet   hydrochlorothiazide (HYDRODIURIL) 12.5 MG tablet   Other Relevant Orders   EKG 12-Lead (Completed)     Other   Hyperlipidemia - Primary    Chronic, ongoing.  Discussed LDL goal < 70 for stroke prevention.  Will trial change from Lipitor to Rosuvastatin 20 MG 3 days a week, due to ongoing issues with persistent leg cramps (which in past improved with trial  off Lipitor).  Educated on Rosuvastatin.  Lipid panel today and return in one month.  She is fasting today.      Relevant Medications   rosuvastatin (CRESTOR) 20 MG tablet   hydrochlorothiazide (HYDRODIURIL) 12.5 MG tablet   Other Relevant Orders   Comprehensive metabolic panel   Lipid Panel w/o Chol/HDL Ratio   Chest pain    X 2 episodes in morning over past 3 months, short episodes with no other symptoms.  EKG within normal limits.  Have recommend trial of TUMS with these episodes, as has h/o GERD.  Recommend if increased episodes then return to office immediately.  Discussed that if other symptoms present with episodes, such as diaphoresis, N&V, radiation of pain, headaches then she is to go to ER immediately.         Time: 20 minutes, >50% spent counseling on new medications and chest pain  Follow up plan: Return in about 1 month (around 10/30/2018) for HLD/HTN with labs.

## 2018-09-30 LAB — COMPREHENSIVE METABOLIC PANEL
ALT: 24 IU/L (ref 0–32)
AST: 24 IU/L (ref 0–40)
Albumin/Globulin Ratio: 1.4 (ref 1.2–2.2)
Albumin: 4.2 g/dL (ref 3.8–4.8)
Alkaline Phosphatase: 167 IU/L — ABNORMAL HIGH (ref 39–117)
BUN/Creatinine Ratio: 14 (ref 12–28)
BUN: 12 mg/dL (ref 8–27)
Bilirubin Total: 0.6 mg/dL (ref 0.0–1.2)
CO2: 24 mmol/L (ref 20–29)
Calcium: 9.6 mg/dL (ref 8.7–10.3)
Chloride: 103 mmol/L (ref 96–106)
Creatinine, Ser: 0.85 mg/dL (ref 0.57–1.00)
GFR calc Af Amer: 80 mL/min/{1.73_m2} (ref >59)
GFR calc non Af Amer: 70 mL/min/{1.73_m2} (ref >59)
Globulin, Total: 3 g/dL (ref 1.5–4.5)
Glucose: 89 mg/dL (ref 65–99)
Potassium: 4.2 mmol/L (ref 3.5–5.2)
Sodium: 141 mmol/L (ref 134–144)
Total Protein: 7.2 g/dL (ref 6.0–8.5)

## 2018-09-30 LAB — LIPID PANEL W/O CHOL/HDL RATIO
Cholesterol, Total: 164 mg/dL (ref 100–199)
HDL: 60 mg/dL (ref >39)
LDL Calculated: 90 mg/dL (ref 0–99)
Triglycerides: 70 mg/dL (ref 0–149)
VLDL Cholesterol Cal: 14 mg/dL (ref 5–40)

## 2018-10-06 IMAGING — MG MM DIGITAL DIAGNOSTIC UNILAT*L* W/ TOMO W/ CAD
6 series · 6 of 14 positions shown · non-contrast
Comparison: Prior studies including 05/05/2016

CLINICAL DATA: Patient returns after screening study for evaluation
of a possible left breast mass.

EXAM:
2D DIGITAL DIAGNOSTIC LEFT MAMMOGRAM WITH CAD AND ADJUNCT TOMO
ULTRASOUND LEFT BREAST

[L CC]
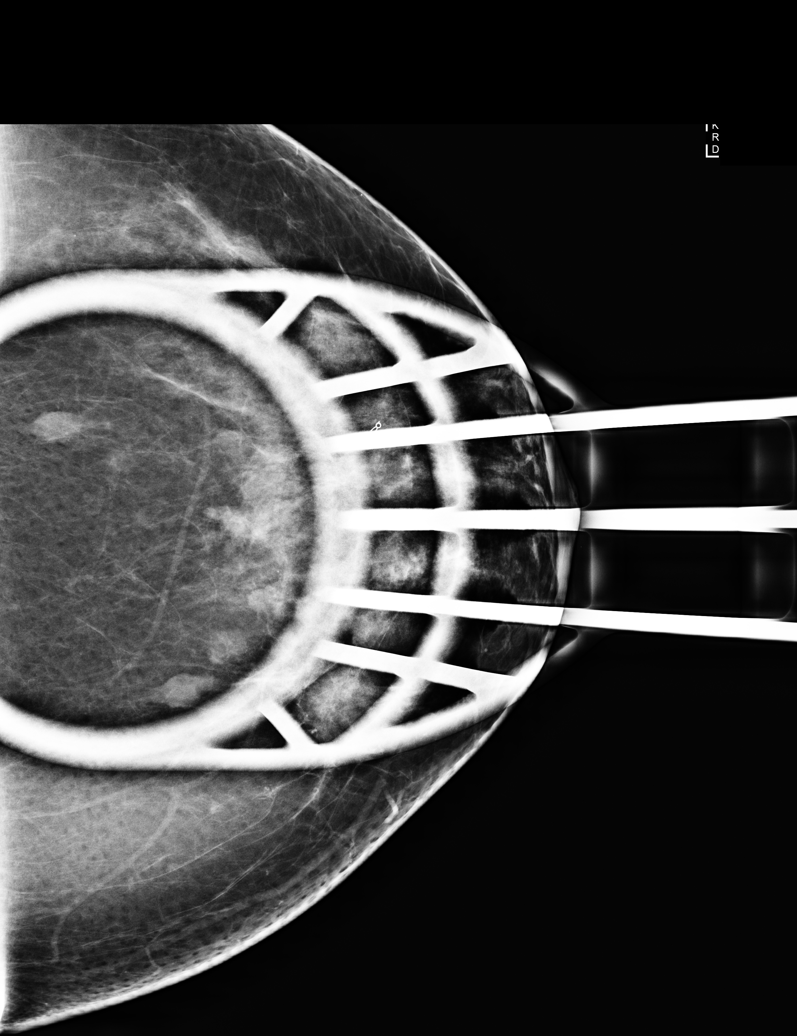

[L CC synth-2D]
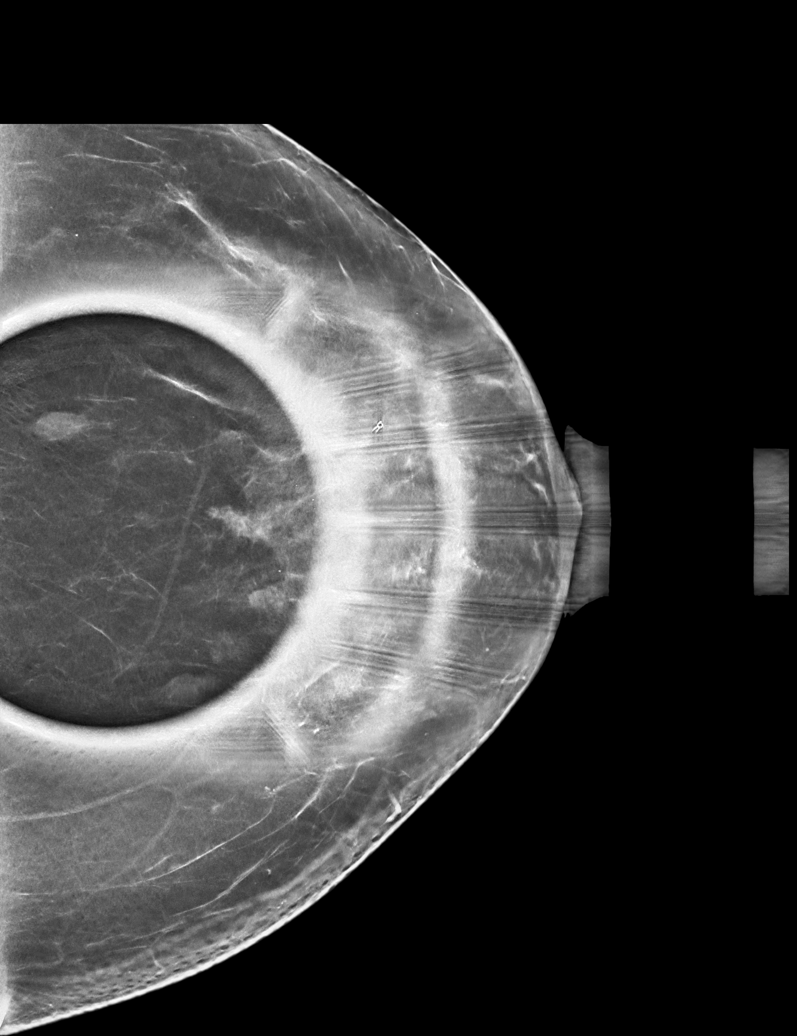

[L MLO]
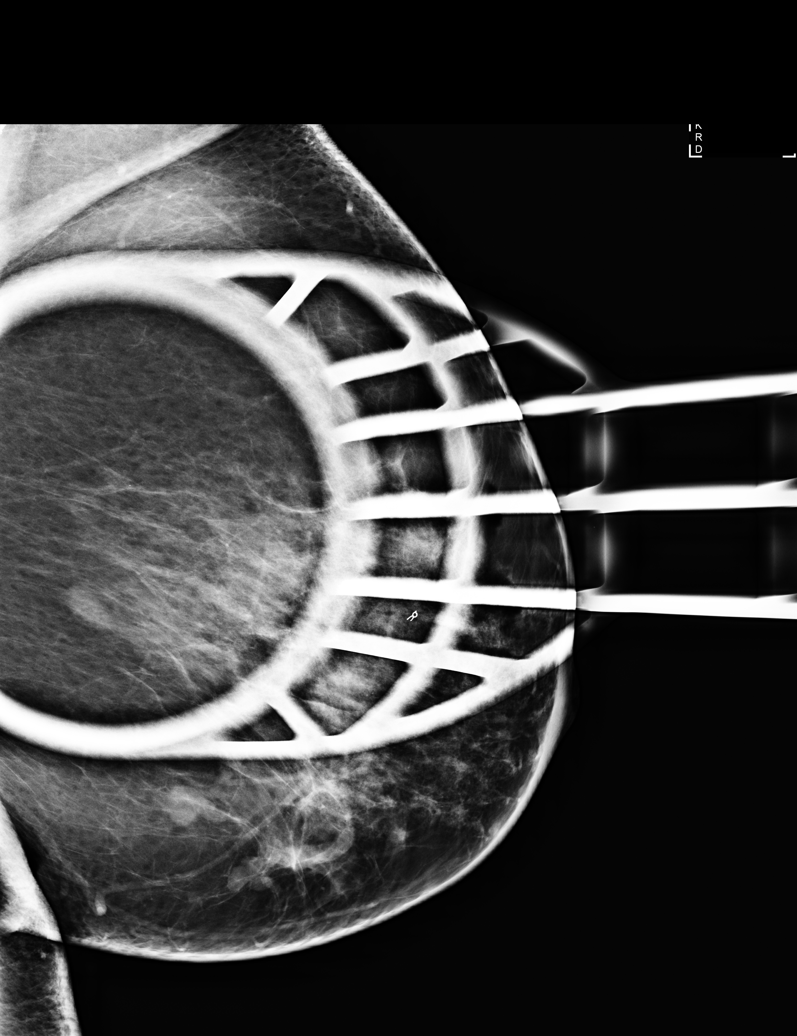

[L MLO synth-2D]
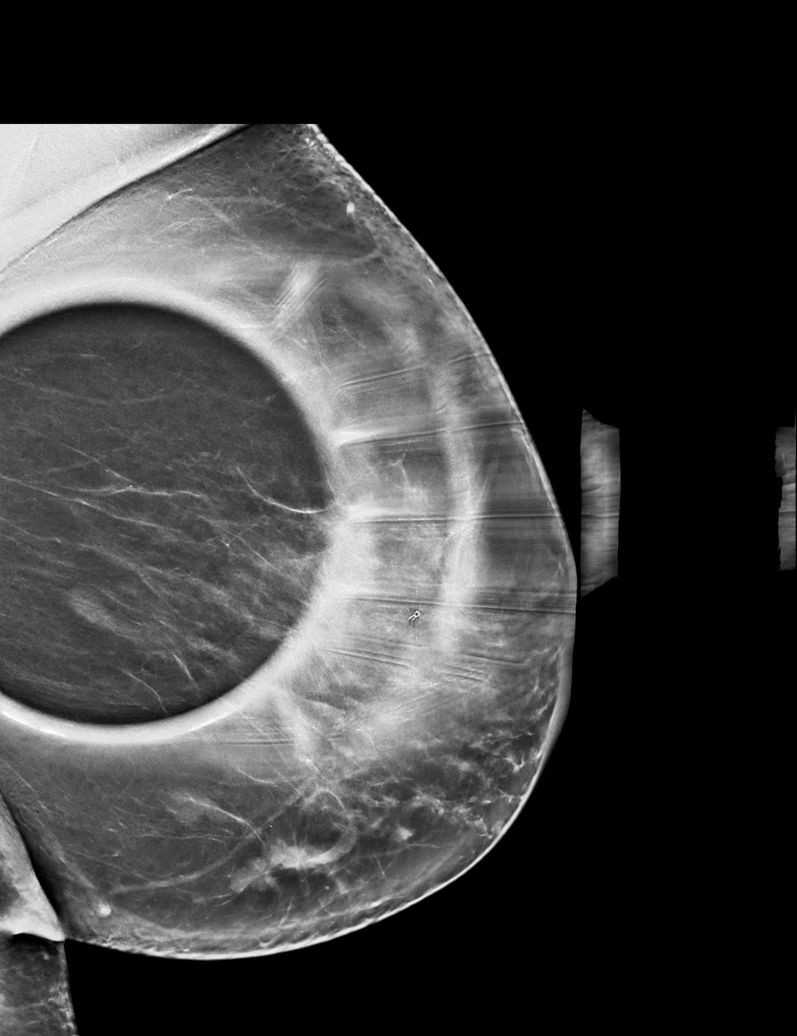

[L CC tomo · tomo slice 35/70.0]
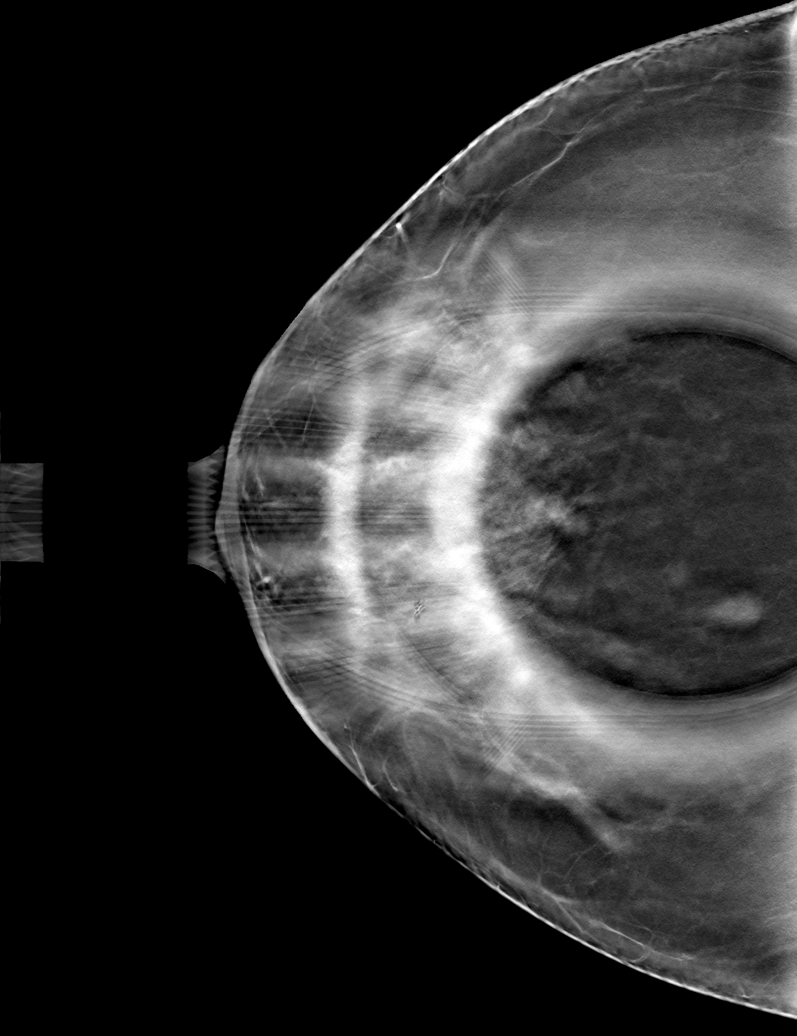

[L MLO tomo · tomo slice 41/80.0]
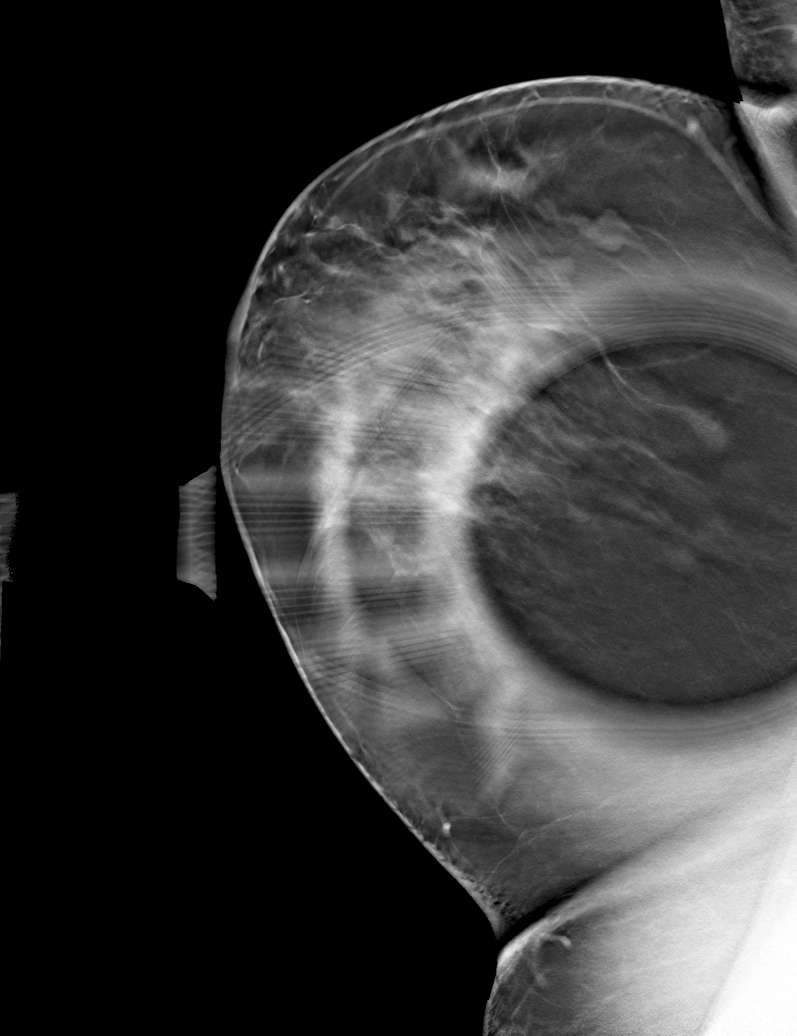

[6 of 14 positions shown; findings below may reference images not displayed]

ACR Breast Density Category c: The breast tissue is heterogeneously
dense, which may obscure small masses.
FINDINGS: Spot compression tomosynthesis images confirm presence of an oval
mass with indistinct margins in the lower outer quadrant of the left
breast. A tissue marker clip is identified in the anterior lower
outer quadrant from previous benign biopsy.

Mammographic images were processed with CAD.

On physical exam, I palpate no abnormality in the lower outer
quadrant of the left breast.

Targeted ultrasound is performed, showing an oval hypoechoic mass in
the 5 o'clock location of the left breast 3 cm from the nipple at
mid depth measuring 0.6 x 0.7 x 0.8 cm. No internal vascularity is
identified on Doppler evaluation. In the 6 o'clock location of the
left breast 2 cm from the nipple, there is a small intraductal mass
measuring 1.1 x 0.4 x 0.9 cm. Scattered cysts are also identified in
the same quadrant. Evaluation of the axilla is negative for
adenopathy.
IMPRESSION: 1. Solid mass in the 5 o'clock location of the left breast 3 cm from
the nipple warranting tissue diagnosis and possibly accounting for
the mammographic finding. Follow-up clip views are recommended to
correlate the sonographic and mammographic abnormalities.
Stereotactic guided core biopsy of the mammographic abnormality in
the lower outer quadrant of the left breast may be needed if the
lesions are discrete.
2. Intraductal mass in the 6 o'clock location of the left breast 2
cm from the nipple for which biopsy is recommended.

RECOMMENDATION:
Ultrasound-guided core biopsy of 2 sites in the left breast.

I have discussed the findings and recommendations with the patient.
Results were also provided in writing at the conclusion of the
visit. If applicable, a reminder letter will be sent to the patient
regarding the next appointment.

BI-RADS CATEGORY  4: Suspicious.

## 2018-10-17 DIAGNOSIS — Z79899 Other long term (current) drug therapy: Secondary | ICD-10-CM | POA: Diagnosis not present

## 2018-10-17 DIAGNOSIS — M1612 Unilateral primary osteoarthritis, left hip: Secondary | ICD-10-CM | POA: Insufficient documentation

## 2018-10-17 DIAGNOSIS — M059 Rheumatoid arthritis with rheumatoid factor, unspecified: Secondary | ICD-10-CM | POA: Diagnosis not present

## 2018-10-17 DIAGNOSIS — M170 Bilateral primary osteoarthritis of knee: Secondary | ICD-10-CM | POA: Diagnosis not present

## 2018-10-27 DIAGNOSIS — M1612 Unilateral primary osteoarthritis, left hip: Secondary | ICD-10-CM | POA: Diagnosis not present

## 2018-11-02 ENCOUNTER — Ambulatory Visit (INDEPENDENT_AMBULATORY_CARE_PROVIDER_SITE_OTHER): Payer: Medicare Other | Admitting: Nurse Practitioner

## 2018-11-02 ENCOUNTER — Encounter: Payer: Self-pay | Admitting: Nurse Practitioner

## 2018-11-02 ENCOUNTER — Other Ambulatory Visit: Payer: Self-pay

## 2018-11-02 ENCOUNTER — Other Ambulatory Visit: Payer: Medicare Other

## 2018-11-02 VITALS — BP 144/68 | HR 83 | Ht 66.0 in | Wt 234.0 lb

## 2018-11-02 DIAGNOSIS — E7800 Pure hypercholesterolemia, unspecified: Secondary | ICD-10-CM

## 2018-11-02 DIAGNOSIS — I10 Essential (primary) hypertension: Secondary | ICD-10-CM

## 2018-11-02 DIAGNOSIS — R928 Other abnormal and inconclusive findings on diagnostic imaging of breast: Secondary | ICD-10-CM | POA: Diagnosis not present

## 2018-11-02 LAB — LIPID PANEL PICCOLO, WAIVED
Chol/HDL Ratio Piccolo,Waive: 2.4 mg/dL
Cholesterol Piccolo, Waived: 138 mg/dL (ref <200)
HDL Chol Piccolo, Waived: 59 mg/dL (ref >59)
LDL Chol Calc Piccolo Waived: 70 mg/dL (ref <100)
Triglycerides Piccolo,Waived: 49 mg/dL (ref <150)
VLDL Chol Calc Piccolo,Waive: 10 mg/dL (ref <30)

## 2018-11-02 NOTE — Assessment & Plan Note (Signed)
Chronic, ongoing with improvement in muscle cramps with change to Crestor 20 MG three days a week.  Continue current medication regimen.  Have come in for outpatient labs over next week.

## 2018-11-02 NOTE — Assessment & Plan Note (Signed)
Chronic, ongoing.  BP elevated SBP today, but she reports at baseline numbers at goal and improvement in edema.   Continue current medication regimen.  Have come in for outpatient labs over next week and return in 6 months.

## 2018-11-02 NOTE — Progress Notes (Signed)
BP (!) 144/68 (BP Location: Left Arm, Patient Position: Sitting, Cuff Size: Normal)   Pulse 83   Ht 5\' 6"  (1.676 m)   Wt 234 lb (106.1 kg)   LMP  (LMP Unknown)   BMI 37.77 kg/m    Subjective:    Patient ID: Angela Watts, female    DOB: [birth year], 68 y.o.   MRN: [id number]  HPI: Angela Watts is a 68 y.o. female  Chief Complaint  Patient presents with  . Hypertension  . Hyperlipidemia    . This visit was completed via telephone due to the restrictions of the COVID-19 pandemic. All issues as above were discussed and addressed but no physical exam was performed. If it was felt that the patient should be evaluated in the office, they were directed there. The patient verbally consented to this visit. Patient was unable to complete an audio/visual visit due to Technical difficulties,Lack of internet. Due to the catastrophic nature of the COVID-19 pandemic, this visit was done through audio contact only. . Location of the patient: home . Location of the provider: home . Those involved with this call:  . Provider: Marnee Guarneri, DNP . CMA: Merilyn Baba, CMA . Front Desk/Registration: Don Perking  . Time spent on call: 15 minutes on the phone discussing health concerns. 10 minutes total spent in review of patient's record and preparation of their chart. I verified patient identity using two factors (patient name and date of birth). Patient consents verbally to being seen via telemedicine visit today.   HYPERTENSION / HYPERLIPIDEMIA At last visit HCTZ 12.5 Mg was added to HTN regimen d/t complaint of occasional edema to BLE.  She continues on Amlodipine 5 MG.  At last visit she complained of constant muscle cramps to bilateral legs with Lipitor . We switched to Crestor 20 MG three days a week and she reports cramps have improved. Satisfied with current treatment? yes Duration of hypertension: chronic BP monitoring frequency: a few times a week BP range: 130/70's often  BP medication side effects: no Duration of hyperlipidemia: chronic Cholesterol medication side effects: no Cholesterol supplements: none Medication compliance: good compliance Aspirin: yes Recent stressors: no Recurrent headaches: no Visual changes: no Palpitations: no Dyspnea: no Chest pain: no Lower extremity edema: no Dizzy/lightheaded: no  Relevant past medical, surgical, family and social history reviewed and updated as indicated. Interim medical history since our last visit reviewed. Allergies and medications reviewed and updated.  Review of Systems  Constitutional: Negative for activity change, appetite change, diaphoresis, fatigue and fever.  Respiratory: Negative for cough, chest tightness and shortness of breath.   Cardiovascular: Negative for chest pain, palpitations and leg swelling.  Gastrointestinal: Negative for abdominal distention, abdominal pain, constipation, diarrhea, nausea and vomiting.  Neurological: Negative for dizziness, syncope, weakness, light-headedness, numbness and headaches.  Psychiatric/Behavioral: Negative.     Per HPI unless specifically indicated above     Objective:    BP (!) 144/68 (BP Location: Left Arm, Patient Position: Sitting, Cuff Size: Normal)   Pulse 83   Ht 5\' 6"  (1.676 m)   Wt 234 lb (106.1 kg)   LMP  (LMP Unknown)   BMI 37.77 kg/m   Wt Readings from Last 3 Encounters:  11/02/18 234 lb (106.1 kg)  09/29/18 239 lb (108.4 kg)  07/14/18 234 lb (106.1 kg)    Physical Exam   Unable to perform visual exam due to technical difficulties by patient.  Results for orders placed or performed in visit on 09/29/18  Comprehensive metabolic panel  Result Value Ref Range   Glucose 89 65 - 99 mg/dL   BUN 12 8 - 27 mg/dL   Creatinine, Ser 0.85 0.57 - 1.00 mg/dL   GFR calc non Af Amer 70 >59 mL/min/1.73   GFR calc Af Amer 80 >59 mL/min/1.73   BUN/Creatinine Ratio 14 12 - 28   Sodium 141 134 - 144 mmol/L   Potassium 4.2 3.5 - 5.2  mmol/L   Chloride 103 96 - 106 mmol/L   CO2 24 20 - 29 mmol/L   Calcium 9.6 8.7 - 10.3 mg/dL   Total Protein 7.2 6.0 - 8.5 g/dL   Albumin 4.2 3.8 - 4.8 g/dL   Globulin, Total 3.0 1.5 - 4.5 g/dL   Albumin/Globulin Ratio 1.4 1.2 - 2.2   Bilirubin Total 0.6 0.0 - 1.2 mg/dL   Alkaline Phosphatase 167 (H) 39 - 117 IU/L   AST 24 0 - 40 IU/L   ALT 24 0 - 32 IU/L  Lipid Panel w/o Chol/HDL Ratio  Result Value Ref Range   Cholesterol, Total 164 100 - 199 mg/dL   Triglycerides 70 0 - 149 mg/dL   HDL 60 >39 mg/dL   VLDL Cholesterol Cal 14 5 - 40 mg/dL   LDL Calculated 90 0 - 99 mg/dL      Assessment & Plan:   Problem List Items Addressed This Visit      Cardiovascular and Mediastinum   Hypertension - Primary    Chronic, ongoing.  BP elevated SBP today, but she reports at baseline numbers at goal and improvement in edema.   Continue current medication regimen.  Have come in for outpatient labs over next week and return in 6 months.        Other   Hyperlipidemia    Chronic, ongoing with improvement in muscle cramps with change to Crestor 20 MG three days a week.  Continue current medication regimen.  Have come in for outpatient labs over next week.      Abnormal mammogram of left breast   Relevant Orders   MM DIGITAL SCREENING BILATERAL      I discussed the assessment and treatment plan with the patient. The patient was provided an opportunity to ask questions and all were answered. The patient agreed with the plan and demonstrated an understanding of the instructions.   The patient was advised to call back or seek an in-person evaluation if the symptoms worsen or if the condition fails to improve as anticipated.   I provided 15 minutes of time during this encounter.  Follow up plan: Return in about 6 months (around 05/04/2019) for Annual Physical.

## 2018-11-02 NOTE — Patient Instructions (Signed)
DASH Eating Plan  DASH stands for "Dietary Approaches to Stop Hypertension." The DASH eating plan is a healthy eating plan that has been shown to reduce high blood pressure (hypertension). It may also reduce your risk for type 2 diabetes, heart disease, and stroke. The DASH eating plan may also help with weight loss.  What are tips for following this plan?    General guidelines   Avoid eating more than 2,300 mg (milligrams) of salt (sodium) a day. If you have hypertension, you may need to reduce your sodium intake to 1,500 mg a day.   Limit alcohol intake to no more than 1 drink a day for nonpregnant women and 2 drinks a day for men. One drink equals 12 oz of beer, 5 oz of wine, or 1 oz of hard liquor.   Work with your health care provider to maintain a healthy body weight or to lose weight. Ask what an ideal weight is for you.   Get at least 30 minutes of exercise that causes your heart to beat faster (aerobic exercise) most days of the week. Activities may include walking, swimming, or biking.   Work with your health care provider or diet and nutrition specialist (dietitian) to adjust your eating plan to your individual calorie needs.  Reading food labels     Check food labels for the amount of sodium per serving. Choose foods with less than 5 percent of the Daily Value of sodium. Generally, foods with less than 300 mg of sodium per serving fit into this eating plan.   To find whole grains, look for the word "whole" as the first word in the ingredient list.  Shopping   Buy products labeled as "low-sodium" or "no salt added."   Buy fresh foods. Avoid canned foods and premade or frozen meals.  Cooking   Avoid adding salt when cooking. Use salt-free seasonings or herbs instead of table salt or sea salt. Check with your health care provider or pharmacist before using salt substitutes.   Do not fry foods. Cook foods using healthy methods such as baking, boiling, grilling, and broiling instead.   Cook with  heart-healthy oils, such as olive, canola, soybean, or sunflower oil.  Meal planning   Eat a balanced diet that includes:  ? 5 or more servings of fruits and vegetables each day. At each meal, try to fill half of your plate with fruits and vegetables.  ? Up to 6-8 servings of whole grains each day.  ? Less than 6 oz of lean meat, poultry, or fish each day. A 3-oz serving of meat is about the same size as a deck of cards. One egg equals 1 oz.  ? 2 servings of low-fat dairy each day.  ? A serving of nuts, seeds, or beans 5 times each week.  ? Heart-healthy fats. Healthy fats called Omega-3 fatty acids are found in foods such as flaxseeds and coldwater fish, like sardines, salmon, and mackerel.   Limit how much you eat of the following:  ? Canned or prepackaged foods.  ? Food that is high in trans fat, such as fried foods.  ? Food that is high in saturated fat, such as fatty meat.  ? Sweets, desserts, sugary drinks, and other foods with added sugar.  ? Full-fat dairy products.   Do not salt foods before eating.   Try to eat at least 2 vegetarian meals each week.   Eat more home-cooked food and less restaurant, buffet, and fast food.     When eating at a restaurant, ask that your food be prepared with less salt or no salt, if possible.  What foods are recommended?  The items listed may not be a complete list. Talk with your dietitian about what dietary choices are best for you.  Grains  Whole-grain or whole-wheat bread. Whole-grain or whole-wheat pasta. Brown rice. Oatmeal. Quinoa. Bulgur. Whole-grain and low-sodium cereals. Pita bread. Low-fat, low-sodium crackers. Whole-wheat flour tortillas.  Vegetables  Fresh or frozen vegetables (raw, steamed, roasted, or grilled). Low-sodium or reduced-sodium tomato and vegetable juice. Low-sodium or reduced-sodium tomato sauce and tomato paste. Low-sodium or reduced-sodium canned vegetables.  Fruits  All fresh, dried, or frozen fruit. Canned fruit in natural juice (without  added sugar).  Meat and other protein foods  Skinless chicken or turkey. Ground chicken or turkey. Pork with fat trimmed off. Fish and seafood. Egg whites. Dried beans, peas, or lentils. Unsalted nuts, nut butters, and seeds. Unsalted canned beans. Lean cuts of beef with fat trimmed off. Low-sodium, lean deli meat.  Dairy  Low-fat (1%) or fat-free (skim) milk. Fat-free, low-fat, or reduced-fat cheeses. Nonfat, low-sodium ricotta or cottage cheese. Low-fat or nonfat yogurt. Low-fat, low-sodium cheese.  Fats and oils  Soft margarine without trans fats. Vegetable oil. Low-fat, reduced-fat, or light mayonnaise and salad dressings (reduced-sodium). Canola, safflower, olive, soybean, and sunflower oils. Avocado.  Seasoning and other foods  Herbs. Spices. Seasoning mixes without salt. Unsalted popcorn and pretzels. Fat-free sweets.  What foods are not recommended?  The items listed may not be a complete list. Talk with your dietitian about what dietary choices are best for you.  Grains  Baked goods made with fat, such as croissants, muffins, or some breads. Dry pasta or rice meal packs.  Vegetables  Creamed or fried vegetables. Vegetables in a cheese sauce. Regular canned vegetables (not low-sodium or reduced-sodium). Regular canned tomato sauce and paste (not low-sodium or reduced-sodium). Regular tomato and vegetable juice (not low-sodium or reduced-sodium). Pickles. Olives.  Fruits  Canned fruit in a light or heavy syrup. Fried fruit. Fruit in cream or butter sauce.  Meat and other protein foods  Fatty cuts of meat. Ribs. Fried meat. Bacon. Sausage. Bologna and other processed lunch meats. Salami. Fatback. Hotdogs. Bratwurst. Salted nuts and seeds. Canned beans with added salt. Canned or smoked fish. Whole eggs or egg yolks. Chicken or turkey with skin.  Dairy  Whole or 2% milk, cream, and half-and-half. Whole or full-fat cream cheese. Whole-fat or sweetened yogurt. Full-fat cheese. Nondairy creamers. Whipped toppings.  Processed cheese and cheese spreads.  Fats and oils  Butter. Stick margarine. Lard. Shortening. Ghee. Bacon fat. Tropical oils, such as coconut, palm kernel, or palm oil.  Seasoning and other foods  Salted popcorn and pretzels. Onion salt, garlic salt, seasoned salt, table salt, and sea salt. Worcestershire sauce. Tartar sauce. Barbecue sauce. Teriyaki sauce. Soy sauce, including reduced-sodium. Steak sauce. Canned and packaged gravies. Fish sauce. Oyster sauce. Cocktail sauce. Horseradish that you find on the shelf. Ketchup. Mustard. Meat flavorings and tenderizers. Bouillon cubes. Hot sauce and Tabasco sauce. Premade or packaged marinades. Premade or packaged taco seasonings. Relishes. Regular salad dressings.  Where to find more information:   National Heart, Lung, and Blood Institute: www.nhlbi.nih.gov   American Heart Association: www.heart.org  Summary   The DASH eating plan is a healthy eating plan that has been shown to reduce high blood pressure (hypertension). It may also reduce your risk for type 2 diabetes, heart disease, and stroke.   With the   DASH eating plan, you should limit salt (sodium) intake to 2,300 mg a day. If you have hypertension, you may need to reduce your sodium intake to 1,500 mg a day.   When on the DASH eating plan, aim to eat more fresh fruits and vegetables, whole grains, lean proteins, low-fat dairy, and heart-healthy fats.   Work with your health care provider or diet and nutrition specialist (dietitian) to adjust your eating plan to your individual calorie needs.  This information is not intended to replace advice given to you by your health care provider. Make sure you discuss any questions you have with your health care provider.  Document Released: 04/22/2011 Document Revised: 04/26/2016 Document Reviewed: 04/26/2016  Elsevier Interactive Patient Education  2019 Elsevier Inc.

## 2018-11-03 LAB — COMPREHENSIVE METABOLIC PANEL
ALT: 16 IU/L (ref 0–32)
AST: 18 IU/L (ref 0–40)
Albumin/Globulin Ratio: 1.3 (ref 1.2–2.2)
Albumin: 4 g/dL (ref 3.8–4.8)
Alkaline Phosphatase: 159 IU/L — ABNORMAL HIGH (ref 39–117)
BUN/Creatinine Ratio: 18 (ref 12–28)
BUN: 14 mg/dL (ref 8–27)
Bilirubin Total: 0.3 mg/dL (ref 0.0–1.2)
CO2: 24 mmol/L (ref 20–29)
Calcium: 8.9 mg/dL (ref 8.7–10.3)
Chloride: 101 mmol/L (ref 96–106)
Creatinine, Ser: 0.76 mg/dL (ref 0.57–1.00)
GFR calc Af Amer: 92 mL/min/{1.73_m2} (ref >59)
GFR calc non Af Amer: 80 mL/min/{1.73_m2} (ref >59)
Globulin, Total: 3 g/dL (ref 1.5–4.5)
Glucose: 100 mg/dL — ABNORMAL HIGH (ref 65–99)
Potassium: 4 mmol/L (ref 3.5–5.2)
Sodium: 140 mmol/L (ref 134–144)
Total Protein: 7 g/dL (ref 6.0–8.5)

## 2018-12-02 ENCOUNTER — Other Ambulatory Visit: Payer: Self-pay | Admitting: Nurse Practitioner

## 2018-12-03 NOTE — Telephone Encounter (Signed)
Requested Prescriptions  Pending Prescriptions Disp Refills  . amLODipine (NORVASC) 5 MG tablet [Pharmacy Med Name: amLODIPine Besylate 5 MG Oral Tablet] 90 tablet 1    Sig: Take 1 tablet by mouth once daily     Cardiovascular:  Calcium Channel Blockers Failed - 12/02/2018  4:35 PM      Failed - Last BP in normal range    BP Readings from Last 1 Encounters:  11/02/18 (!) 144/68         Passed - Valid encounter within last 6 months    Recent Outpatient Visits          1 month ago Essential hypertension   [place], [place] T, NP   2 months ago Pure hypercholesterolemia   [place], [place] T, NP   8 months ago Annual physical exam   [place], Barbaraann Faster, NP   11 months ago Pure hypercholesterolemia   [place] Trinna Post, PA-C   1 year ago Essential hypertension   [place] Kathrine Haddock, NP      Future Appointments            In 3 months  [place], [place]   In 5 months [name], Barbaraann Faster, NP [employer]

## 2018-12-30 ENCOUNTER — Other Ambulatory Visit: Payer: Self-pay | Admitting: Radiology

## 2018-12-30 DIAGNOSIS — R6889 Other general symptoms and signs: Secondary | ICD-10-CM | POA: Diagnosis not present

## 2018-12-30 DIAGNOSIS — Z20822 Contact with and (suspected) exposure to covid-19: Secondary | ICD-10-CM

## 2018-12-31 LAB — NOVEL CORONAVIRUS, NAA: SARS-CoV-2, NAA: NOT DETECTED

## 2019-01-09 ENCOUNTER — Other Ambulatory Visit: Payer: Self-pay | Admitting: Nurse Practitioner

## 2019-01-09 DIAGNOSIS — R928 Other abnormal and inconclusive findings on diagnostic imaging of breast: Secondary | ICD-10-CM

## 2019-01-26 ENCOUNTER — Ambulatory Visit
Admission: RE | Admit: 2019-01-26 | Discharge: 2019-01-26 | Disposition: A | Discharge status: Home / Self Care | Payer: Medicare Other | Source: Ambulatory Visit | Attending: Nurse Practitioner | Admitting: Nurse Practitioner

## 2019-01-26 DIAGNOSIS — R922 Inconclusive mammogram: Secondary | ICD-10-CM | POA: Diagnosis not present

## 2019-01-26 DIAGNOSIS — R928 Other abnormal and inconclusive findings on diagnostic imaging of breast: Secondary | ICD-10-CM | POA: Diagnosis not present

## 2019-01-29 ENCOUNTER — Other Ambulatory Visit: Payer: Self-pay | Admitting: Nurse Practitioner

## 2019-01-29 DIAGNOSIS — N6320 Unspecified lump in the left breast, unspecified quadrant: Secondary | ICD-10-CM

## 2019-01-29 DIAGNOSIS — R928 Other abnormal and inconclusive findings on diagnostic imaging of breast: Secondary | ICD-10-CM

## 2019-01-29 DIAGNOSIS — D242 Benign neoplasm of left breast: Secondary | ICD-10-CM

## 2019-01-29 NOTE — Progress Notes (Signed)
Spoke to patient via telephone about recent mammogram results, which she was aware of, and recommendation for excision of area.  Surgical referral placed.

## 2019-02-05 ENCOUNTER — Ambulatory Visit
Admission: RE | Admit: 2019-02-05 | Discharge: 2019-02-05 | Disposition: A | Discharge status: Home / Self Care | Payer: Medicare Other | Source: Ambulatory Visit | Attending: Nurse Practitioner | Admitting: Nurse Practitioner

## 2019-02-05 DIAGNOSIS — R928 Other abnormal and inconclusive findings on diagnostic imaging of breast: Secondary | ICD-10-CM

## 2019-02-05 DIAGNOSIS — N6320 Unspecified lump in the left breast, unspecified quadrant: Secondary | ICD-10-CM

## 2019-02-05 DIAGNOSIS — N6323 Unspecified lump in the left breast, lower outer quadrant: Secondary | ICD-10-CM | POA: Diagnosis not present

## 2019-02-05 DIAGNOSIS — N6082 Other benign mammary dysplasias of left breast: Secondary | ICD-10-CM | POA: Diagnosis not present

## 2019-02-05 HISTORY — PX: BREAST BIOPSY: SHX20

## 2019-02-06 LAB — SURGICAL PATHOLOGY

## 2019-02-07 ENCOUNTER — Encounter: Payer: Self-pay | Admitting: Surgery

## 2019-02-07 ENCOUNTER — Ambulatory Visit (INDEPENDENT_AMBULATORY_CARE_PROVIDER_SITE_OTHER): Payer: Medicare Other | Admitting: Surgery

## 2019-02-07 ENCOUNTER — Other Ambulatory Visit: Payer: Self-pay

## 2019-02-07 VITALS — BP 142/75 | HR 82 | Temp 97.2°F | Resp 14 | Ht 62.0 in | Wt 237.6 lb

## 2019-02-07 DIAGNOSIS — D242 Benign neoplasm of left breast: Secondary | ICD-10-CM | POA: Diagnosis not present

## 2019-02-07 NOTE — Patient Instructions (Addendum)
Please call our office if you have questions or concerns. WE will call you August 2021 to schedule your mammogram and breast exam for September 2021.

## 2019-02-07 NOTE — Progress Notes (Signed)
02/07/2019  History of Present Illness: Angela Watts is a 68 y.o. 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  She has had biopsies in 2011 in 2018 both showing an intraductal papilloma.  She was last seen by Dr. Tollie Pizza on 11/22/2016 at which time yearly mammogram was recommended but looking at her chart, no bilateral mammogram has been done since December 2017.  Her last diagnostic mammogram on the left breast was in January 2018.  This showed a solid mass in the 5:00 location of the left breast 3 cm from the nipple for which biopsy was recommended, as well as an intraductal mass in the 6:00 location 2 cm from the nipple for which biopsy was also recommended.  On 06/02/2016, Dr. Bary Castilla performed a biopsy of the area at 6:00 2 cm from the nipple and aspirated the area at 5:00 3 cm from the nipple.  Biopsies from this resulted in intraductal papilloma.  She had a diagnostic bilateral mammogram on 01/26/2019 which showed a persistent 7 mm mass in the slightly lower outer quadrant, posterior depth.  This was recommended for biopsy which was done on 02/05/2019.  Pathology results showed benign mammary parenchyma with fragments of sclerosed intraductal papilloma.  Patient reports that she is been doing well without any issues with pain or bruising from the biopsy.  She denies any palpable masses, skin changes, or nipple changes.  Denies any drainage from the nipple.  Past Medical History: Past Medical History:  Diagnosis Date  . Abnormal Pap smear of cervix   . Abscess of bursa, left hip   . Arthritis   . Colon adenomas   . GERD (gastroesophageal reflux disease)   . Hyperlipidemia   . Hypertension   . Psoriasis   . Rheumatoid arthritis flare (HCC)      Past Surgical History: Past Surgical History:  Procedure Laterality Date  . ABDOMINAL HYSTERECTOMY  2003  . BREAST BIOPSY Left 06/01/2016   [place] bx done by dr byrnett 6:00 2cmfn intraducal papilloma with calcs  . BREAST  BIOPSY Left 02/05/2019   Affirm bx-"X" clip-path pending  . BREAST CYST ASPIRATION Left 2011   NEG/ Br Byrnett  . BREAST CYST ASPIRATION Left 06/01/2016   dr byrnett 5:00 3cmfn aspirated with complete resolution  . COLONOSCOPY WITH PROPOFOL N/A 06/11/2015   Procedure: COLONOSCOPY WITH PROPOFOL;  Surgeon: Manya Silvas, MD;  Location: [facility] ENDOSCOPY;  Service: Endoscopy;  Laterality: N/A;  . COLONOSCOPY WITH PROPOFOL N/A 07/14/2018   Procedure: COLONOSCOPY WITH PROPOFOL;  Surgeon: Manya Silvas, MD;  Location: [facility] ENDOSCOPY;  Service: Endoscopy;  Laterality: N/A;  . CYST EXCISION Left    breast    Home Medications: Prior to Admission medications   Medication Sig Start Date End Date Taking? Authorizing Provider  amLODipine (NORVASC) 5 MG tablet Take 1 tablet by mouth once daily 12/03/18  Yes [name], Jolene T, NP  aspirin EC 81 MG tablet Take 81 mg by mouth daily.   Yes [provider]  folic acid (FOLVITE) 1 MG tablet Take 1 tablet by mouth daily. 08/03/17  Yes [provider]  hydrochlorothiazide (HYDRODIURIL) 12.5 MG tablet Take 1 tablet (12.5 mg total) by mouth daily. 09/29/18  Yes [name], Jolene T, NP  methotrexate (RHEUMATREX) 2.5 MG tablet Take 7 tabs (17.5 MG ) once a week for 12 weeks 02/02/18  Yes [provider]  rosuvastatin (CRESTOR) 20 MG tablet Take one tablet (20 MG) on Monday, Wednesday, and Friday at 6 PM. 09/29/18  Yes Venita Lick, NP    Allergies: Allergies  Allergen Reactions  . Prednisone Itching    Review of Systems: Review of Systems  Constitutional: Negative for chills and fever.  Respiratory: Negative for shortness of breath.   Cardiovascular: Negative for chest pain.  Gastrointestinal: Negative for nausea and vomiting.  Skin: Negative for rash.    Physical Exam BP (!) 142/75   Pulse 82   Temp (!) 97.2 F (36.2 C) (Temporal)   Resp 14   Ht 5\' 2"  (1.575 m)   Wt 237 lb 9.6 oz (107.8 kg)   LMP  (LMP Unknown)    SpO2 98%   BMI 43.46 kg/m  CONSTITUTIONAL: No acute distress HEENT:  Normocephalic, atraumatic, extraocular motion intact. RESPIRATORY:  Lungs are clear, and breath sounds are equal bilaterally. Normal respiratory effort without pathologic use of accessory muscles. CARDIOVASCULAR: Heart is regular without murmurs, gallops, or rubs. BREAST: Left breast status post ultrasound-guided biopsy with Steri-Strips in place with no evidence of bruising or hematoma.  No palpable masses, skin changes, nipple retraction or drainage.  No left axillary or supraclavicular lymphadenopathy.  On the right side, no palpable masses, skin changes, or nipple changes or drainage.  No right axillary or supraclavicular lymphadenopathy NEUROLOGIC:  Motor and sensation is grossly normal.  Cranial nerves are grossly intact. PSYCH:  Alert and oriented to person, place and time. Affect is normal.  Labs/Imaging: Mammogram on 01/26/2019: FINDINGS: Mammographically, there are no suspicious masses, areas of architectural distortion or microcalcifications in the right breast.  In the left breast slightly lower outer quadrant, posterior depth, there is a persistent 7 mm mass. Other stable few mm nodules are seen throughout the left breast. Two post biopsy clips correspond to previously biopsy-proven papillomas.  Mammographic images were processed with CAD.  IMPRESSION: No mammographic evidence of right breast malignancy.  Left breast slightly lower outer quadrant persistent mass, for which stereotactic core needle biopsy is recommended.  Pathology 02/05/19: DIAGNOSIS:  A. BREAST, LEFT LOWER OUTER QUADRANT; STEREOTACTIC CORE BIOPSY:  - BENIGN MAMMARY PARENCHYMA WITH FRAGMENTS OF SCLEROSED INTRADUCTAL PAPILLOMA.  - NEGATIVE FOR ATYPICAL PROLIFERATIVE BREAST DISEASE.  Assessment and Plan: This is a 68 y.o. female with prior breast biopsies with most recent one also showing intraductal papilloma  Discussed with the  patient that the pathology results showed benign finding of intraductal papilloma.  There was no evidence of atypical proliferative disease or malignancy.  The patient had been followed by Dr. Bary Castilla who had recommended follow up and not excision in the past.  At this point, discussed with the patient that we could continue watchful waiting given all the benign results, and she's in agreement with this plan.  However, discussed with her that once the radiologist reviewed the pathology results, if they feel that the images and pathology are not concordant, then we would discuss excision as a precaution.  Will call the patient once the review has been done to inform her.  Face-to-face time spent with the patient and care providers was 15 minutes, with more than 50% of the time spent counseling, educating, and coordinating care of the patient.     Melvyn Neth, [facility]

## 2019-02-09 ENCOUNTER — Telehealth: Payer: Self-pay

## 2019-02-09 NOTE — Progress Notes (Signed)
02/09/19 8:57 am  Discussed case with Dr. Derrel Nip at [facility].  The patient has history of intraductal papillomas.  She had one biopsied in 2011 which has been stable since, one that was biopsied in 05/2016 which has also been stable, and one that was originally found in 04/2016 and was just biopsied on 02/05/19.  Comparing mammograms from 04/2016 that found this most recent one, and the mammogram on 01/26/19, Dr. Derrel Nip mentions that the area is smaller than what it was in 2017.    Given that the pathology results did not show any atypia, we discussed the role for breast MRI vs continued follow up.  We agreed on continued follow up given the stability/improvement of the three areas.   Patient will have bilateral mammogram in 01/2020 and follow up with me.   Olean Ree, MD

## 2019-02-09 NOTE — Telephone Encounter (Signed)
Left message on VM regarding follow up with yearly mammogram 01/2020 and see Dr.Piscoya after she has mammogram.

## 2019-02-16 DIAGNOSIS — Z23 Encounter for immunization: Secondary | ICD-10-CM | POA: Diagnosis not present

## 2019-03-26 ENCOUNTER — Other Ambulatory Visit: Payer: Self-pay

## 2019-03-26 ENCOUNTER — Ambulatory Visit (INDEPENDENT_AMBULATORY_CARE_PROVIDER_SITE_OTHER): Payer: Medicare Other

## 2019-03-26 VITALS — BP 122/64 | HR 81 | Temp 97.6°F | Resp 16 | Ht 66.0 in | Wt 238.4 lb

## 2019-03-26 DIAGNOSIS — Z0000 Encounter for general adult medical examination without abnormal findings: Secondary | ICD-10-CM | POA: Diagnosis not present

## 2019-03-26 NOTE — Progress Notes (Signed)
Subjective:   Angela Watts is a 68 y.o. female who presents for Medicare Annual (Subsequent) preventive examination.  .  Review of Systems:   Cardiac Risk Factors include: advanced age (>55men, >58 women);dyslipidemia;hypertension     Objective:     Vitals: BP 122/64 (BP Location: Right Arm, Patient Position: Sitting, Cuff Size: Normal)   Pulse 81   Temp 97.6 F (36.4 C) (Temporal)   Resp 16   Ht 5\' 6"  (1.676 m)   Wt 238 lb 6.4 oz (108.1 kg)   LMP  (LMP Unknown)   BMI 38.48 kg/m   Body mass index is 38.48 kg/m.  Advanced Directives 03/26/2019 07/14/2018 03/23/2018 03/21/2017 09/18/2016 07/27/2016 03/19/2016  Does Patient Have a Medical Advance Directive? Yes Yes No Yes No No Yes  Type of Advance Directive Living will;Healthcare Power of [place];Living will - - Living will  Copy of [place] in Chart? No - copy requested - - No - copy requested - - -  Would patient like information on creating a medical advance directive? - - Yes (MAU/Ambulatory/Procedural Areas - Information given) - - No - Patient declined -    Tobacco Social History   Tobacco Use  Smoking Status Never Smoker  Smokeless Tobacco Never Used     Counseling given: Not Answered   Clinical Intake:  Pre-visit preparation completed: Yes  Pain : No/denies pain     Nutritional Status: BMI > 30  Obese Nutritional Risks: None Diabetes: No  How often do you need to have someone help you when you read instructions, pamphlets, or other written materials from your doctor or pharmacy?: 1 - Never  Interpreter Needed?: No  Information entered by :: Gennell How,LPN  Past Medical History:  Diagnosis Date  . Abnormal Pap smear of cervix   . Abscess of bursa, left hip   . Arthritis   . Colon adenomas   . GERD (gastroesophageal reflux disease)   . Hyperlipidemia   . Hypertension   . Psoriasis   . Rheumatoid arthritis flare (HCC)    Past Surgical  History:  Procedure Laterality Date  . ABDOMINAL HYSTERECTOMY  2003  . BREAST BIOPSY Left 06/01/2016   [place] bx done by dr byrnett 6:00 2cmfn intraducal papilloma with calcs  . BREAST BIOPSY Left 02/05/2019   Affirm bx-"X" clip-path pending  . BREAST CYST ASPIRATION Left 2011   NEG/ Br Byrnett  . BREAST CYST ASPIRATION Left 06/01/2016   dr byrnett 5:00 3cmfn aspirated with complete resolution  . COLONOSCOPY WITH PROPOFOL N/A 06/11/2015   Procedure: COLONOSCOPY WITH PROPOFOL;  Surgeon: Manya Silvas, MD;  Location: [facility] ENDOSCOPY;  Service: Endoscopy;  Laterality: N/A;  . COLONOSCOPY WITH PROPOFOL N/A 07/14/2018   Procedure: COLONOSCOPY WITH PROPOFOL;  Surgeon: Manya Silvas, MD;  Location: [facility] ENDOSCOPY;  Service: Endoscopy;  Laterality: N/A;  . CYST EXCISION Left    breast   Family History  Problem Relation Age of Onset  . Hypertension Mother   . Arthritis Mother   . Breast cancer Mother   . Cancer Mother        breast  . Hypertension Father   . Lung cancer Father   . Diabetes Sister   . Hypertension Sister   . Hypertension Brother   . Hypertension Sister   . Hypertension Sister   . Hypertension Sister   . Hypertension Brother   . Cancer Brother        lung  Social History   Socioeconomic History  . Marital status: Single    Spouse name: Not on file  . Number of children: Not on file  . Years of education: Not on file  . Highest education level: Not on file  Occupational History  . Not on file  Social Needs  . Financial resource strain: Not hard at all  . Food insecurity    Worry: Never true    Inability: Never true  . Transportation needs    Medical: No    Non-medical: No  Tobacco Use  . Smoking status: Never Smoker  . Smokeless tobacco: Never Used  Substance and Sexual Activity  . Alcohol use: Yes    Alcohol/week: 0.0 standard drinks    Comment: on occasion/socially  . Drug use: No  . Sexual activity: Never  Lifestyle  . Physical activity     Days per week: 4 days    Minutes per session: 30 min  . Stress: Only a little  Relationships  . Social connections    Talks on phone: More than three times a week    Gets together: More than three times a week    Attends religious service: More than 4 times per year    Active member of club or organization: No    Attends meetings of clubs or organizations: Never    Relationship status: Never married  Other Topics Concern  . Not on file  Social History Narrative  . Not on file    Outpatient Encounter Medications as of 03/26/2019  Medication Sig  . amLODipine (NORVASC) 5 MG tablet Take 1 tablet by mouth once daily  . aspirin EC 81 MG tablet Take 81 mg by mouth daily.  . folic acid (FOLVITE) 1 MG tablet Take 1 tablet by mouth daily.  . hydrochlorothiazide (HYDRODIURIL) 12.5 MG tablet Take 1 tablet (12.5 mg total) by mouth daily.  . methotrexate (RHEUMATREX) 2.5 MG tablet Take 7 tabs (17.5 MG ) once a week for 12 weeks  . Multiple Vitamin (MULTIVITAMIN) capsule Take 1 capsule by mouth daily.  . rosuvastatin (CRESTOR) 20 MG tablet Take one tablet (20 MG) on Monday, Wednesday, and Friday at 6 PM.   No facility-administered encounter medications on file as of 03/26/2019.     Activities of Daily Living In your present state of health, do you have any difficulty performing the following activities: 03/26/2019 03/30/2018  Hearing? N N  Comment no hearing aids -  Vision? N N  Comment eyeglasses, [facility] -  Difficulty concentrating or making decisions? N N  Walking or climbing stairs? N N  Dressing or bathing? N N  Doing errands, shopping? N N  Preparing Food and eating ? N -  Using the Toilet? N -  In the past six months, have you accidently leaked urine? N -  Do you have problems with loss of bowel control? N -  Managing your Medications? N -  Managing your Finances? N -  Housekeeping or managing your Housekeeping? N -  Some recent data might be hidden    Patient Care Team:  Venita Lick, NP as PCP - General (Nurse Practitioner) Valerie Roys, DO as Referring Physician (Family Medicine) Bary Castilla Forest Gleason, MD (General Surgery) Marlowe Sax, MD as Referring Physician (Internal Medicine)    Assessment:   This is a routine wellness examination for Angela Watts.  Exercise Activities and Dietary recommendations Current Exercise Habits: Home exercise routine, Type of exercise: walking, Time (Minutes): 30, Frequency (Times/Week):  3, Weekly Exercise (Minutes/Week): 90, Intensity: Mild, Exercise limited by: None identified  Goals   None     Fall Risk: Fall Risk  03/26/2019 02/07/2019 04/03/2018 03/23/2018 03/21/2017  Falls in the past year? 0 0 0 0 No  Comment - - Emmi Telephone Survey: data to providers prior to load - -  Number falls in past yr: 0 - - 0 -  Injury with Fall? 0 - - 0 -    FALL RISK PREVENTION PERTAINING TO THE HOME:  Any stairs in or around the home? No  If so, are there any without handrails? No   Home free of loose throw rugs in walkways, pet beds, electrical cords, etc? Yes  Adequate lighting in your home to reduce risk of falls? Yes   ASSISTIVE DEVICES UTILIZED TO PREVENT FALLS:  Life alert? No  Use of a cane, walker or w/c? No  Grab bars in the bathroom? No  Shower chair or bench in shower? No  Elevated toilet seat or a handicapped toilet? No   DME ORDERS:  DME order needed?  No   TIMED UP AND GO:  Was the test performed? Yes .  Length of time to ambulate 10 feet: 9 sec.   GAIT:  Appearance of gait: Gait steady and fast without the use of an assistive device.  Education: Fall risk prevention has been discussed.  Intervention(s) required? No   DME/home health order needed?  No    Depression Screen PHQ 2/9 Scores 03/26/2019 03/23/2018 03/21/2017 03/19/2016  PHQ - 2 Score 0 1 1 0  PHQ- 9 Score - - 3 0     Cognitive Function     6CIT Screen 03/26/2019 03/23/2018 03/21/2017  What Year? 0 points 0 points 0  points  What month? 0 points 0 points 0 points  What time? 0 points 0 points 0 points  Count back from 20 0 points 0 points 0 points  Months in reverse 0 points 0 points 0 points  Repeat phrase 0 points 4 points 0 points  Total Score 0 4 0    Immunization History  Administered Date(s) Administered  . Influenza, High Dose Seasonal PF 03/19/2016, 03/21/2017, 03/23/2018, 02/15/2019  . Influenza,inj,Quad PF,6+ Mos 03/18/2015  . Pneumococcal Conjugate-13 02/11/2014  . Pneumococcal Polysaccharide-23 02/03/2010, 03/18/2015  . Td 03/19/2004  . Tdap 03/18/2015  . Zoster 02/20/2013    Qualifies for Shingles Vaccine? Yes  Zostavax completed 2014 . Due for Shingrix. Education has been provided regarding the importance of this vaccine. Pt has been advised to call insurance company to determine out of pocket expense. Advised may also receive vaccine at local pharmacy or Health Dept. Verbalized acceptance and understanding.  Tdap: up to date   Flu Vaccine: up to date   Pneumococcal Vaccine: up to date   Screening Tests Health Maintenance  Topic Date Due  . MAMMOGRAM  01/25/2021  . TETANUS/TDAP  03/17/2025  . COLONOSCOPY  07/14/2028  . INFLUENZA VACCINE  Completed  . DEXA SCAN  Completed  . Hepatitis C Screening  Completed  . PNA vac Low Risk Adult  Completed    Cancer Screenings:  Colorectal Screening: Completed 07/14/2018. Repeat every 10 years  Mammogram: Completed 01/26/2019. Repeat every year   Bone Density: Completed 05/05/2016  Lung Cancer Screening: (Low Dose CT Chest recommended if Age 32-80 years, 30 pack-year currently smoking OR have quit w/in 15years.) does not qualify.    Additional Screening:  Hepatitis C Screening: does qualify; Completed 2016  Vision  Screening: Recommended annual ophthalmology exams for early detection of glaucoma and other disorders of the eye. Is the patient up to date with their annual eye exam?  Yes  Who is the provider or what is the name  of the office in which the pt attends annual eye exams? [facility]    Dental Screening: Recommended annual dental exams for proper oral hygiene  Community Resource Referral:  CRR required this visit?  No       Plan:  I have personally reviewed and addressed the Medicare Annual Wellness questionnaire and have noted the following in the patient's chart:  A. Medical and social history B. Use of alcohol, tobacco or illicit drugs  C. Current medications and supplements D. Functional ability and status E.  Nutritional status F.  Physical activity G. Advance directives H. List of other physicians I.  Hospitalizations, surgeries, and ER visits in previous 12 months J.  Biggers such as hearing and vision if needed, cognitive and depression L. Referrals and appointments   In addition, I have reviewed and discussed with patient certain preventive protocols, quality metrics, and best practice recommendations. A written personalized care plan for preventive services as well as general preventive health recommendations were provided to patient.  Signed,    Bevelyn Ngo, LPN  [id number] Nurse Health Advisor   Nurse Notes: none

## 2019-03-26 NOTE — Patient Instructions (Signed)
Angela Watts , Thank you for taking time to come for your Medicare Wellness Visit. I appreciate your ongoing commitment to your health goals. Please review the following plan we discussed and let me know if I can assist you in the future.   Screening recommendations/referrals: Colonoscopy: completed 07/14/2018 Mammogram: completed 01/26/2019 Bone Density: completed 05/05/2016 Recommended yearly ophthalmology/optometry visit for glaucoma screening and checkup Recommended yearly dental visit for hygiene and checkup  Vaccinations: Influenza vaccine: up to date  Pneumococcal vaccine: up to date Tdap vaccine: up to date Shingles vaccine: shingrix eligible     Advanced directives: Please bring a copy of your health care power of attorney and living will to the office at your convenience.  Conditions/risks identified: continue exercising at least 3 days a week for 30 min.   Next appointment: Follow up in one year for your annual wellness visit    Preventive Care 65 Years and Older, Female Preventive care refers to lifestyle choices and visits with your health care provider that can promote health and wellness. What does preventive care include?  A yearly physical exam. This is also called an annual well check.  Dental exams once or twice a year.  Routine eye exams. Ask your health care provider how often you should have your eyes checked.  Personal lifestyle choices, including:  Daily care of your teeth and gums.  Regular physical activity.  Eating a healthy diet.  Avoiding tobacco and drug use.  Limiting alcohol use.  Practicing safe sex.  Taking low-dose aspirin every day.  Taking vitamin and mineral supplements as recommended by your health care provider. What happens during an annual well check? The services and screenings done by your health care provider during your annual well check will depend on your age, overall health, lifestyle risk factors, and family history  of disease. Counseling  Your health care provider may ask you questions about your:  Alcohol use.  Tobacco use.  Drug use.  Emotional well-being.  Home and relationship well-being.  Sexual activity.  Eating habits.  History of falls.  Memory and ability to understand (cognition).  Work and work Statistician.  Reproductive health. Screening  You may have the following tests or measurements:  Height, weight, and BMI.  Blood pressure.  Lipid and cholesterol levels. These may be checked every 5 years, or more frequently if you are over 73 years old.  Skin check.  Lung cancer screening. You may have this screening every year starting at age 25 if you have a 30-pack-year history of smoking and currently smoke or have quit within the past 15 years.  Fecal occult blood test (FOBT) of the stool. You may have this test every year starting at age 4.  Flexible sigmoidoscopy or colonoscopy. You may have a sigmoidoscopy every 5 years or a colonoscopy every 10 years starting at age 73.  Hepatitis C blood test.  Hepatitis B blood test.  Sexually transmitted disease (STD) testing.  Diabetes screening. This is done by checking your blood sugar (glucose) after you have not eaten for a while (fasting). You may have this done every 1-3 years.  Bone density scan. This is done to screen for osteoporosis. You may have this done starting at age 17.  Mammogram. This may be done every 1-2 years. Talk to your health care provider about how often you should have regular mammograms. Talk with your health care provider about your test results, treatment options, and if necessary, the need for more tests. Vaccines  Your health care provider may recommend certain vaccines, such as:  Influenza vaccine. This is recommended every year.  Tetanus, diphtheria, and acellular pertussis (Tdap, Td) vaccine. You may need a Td booster every 10 years.  Zoster vaccine. You may need this after age 78.   Pneumococcal 13-valent conjugate (PCV13) vaccine. One dose is recommended after age 8.  Pneumococcal polysaccharide (PPSV23) vaccine. One dose is recommended after age 18. Talk to your health care provider about which screenings and vaccines you need and how often you need them. This information is not intended to replace advice given to you by your health care provider. Make sure you discuss any questions you have with your health care provider. Document Released: 05/30/2015 Document Revised: 01/21/2016 Document Reviewed: 03/04/2015 Elsevier Interactive Patient Education  2017 [employer] Prevention in the Home Falls can cause injuries. They can happen to people of all ages. There are many things you can do to make your home safe and to help prevent falls. What can I do on the outside of my home?  Regularly fix the edges of walkways and driveways and fix any cracks.  Remove anything that might make you trip as you walk through a door, such as a raised step or threshold.  Trim any bushes or trees on the path to your home.  Use bright outdoor lighting.  Clear any walking paths of anything that might make someone trip, such as rocks or tools.  Regularly check to see if handrails are loose or broken. Make sure that both sides of any steps have handrails.  Any raised decks and porches should have guardrails on the edges.  Have any leaves, snow, or ice cleared regularly.  Use sand or salt on walking paths during winter.  Clean up any spills in your garage right away. This includes oil or grease spills. What can I do in the bathroom?  Use night lights.  Install grab bars by the toilet and in the tub and shower. Do not use towel bars as grab bars.  Use non-skid mats or decals in the tub or shower.  If you need to sit down in the shower, use a plastic, non-slip stool.  Keep the floor dry. Clean up any water that spills on the floor as soon as it happens.  Remove soap  buildup in the tub or shower regularly.  Attach bath mats securely with double-sided non-slip rug tape.  Do not have throw rugs and other things on the floor that can make you trip. What can I do in the bedroom?  Use night lights.  Make sure that you have a light by your bed that is easy to reach.  Do not use any sheets or blankets that are too big for your bed. They should not hang down onto the floor.  Have a firm chair that has side arms. You can use this for support while you get dressed.  Do not have throw rugs and other things on the floor that can make you trip. What can I do in the kitchen?  Clean up any spills right away.  Avoid walking on wet floors.  Keep items that you use a lot in easy-to-reach places.  If you need to reach something above you, use a strong step stool that has a grab bar.  Keep electrical cords out of the way.  Do not use floor polish or wax that makes floors slippery. If you must use wax, use non-skid floor wax.  Do  not have throw rugs and other things on the floor that can make you trip. What can I do with my stairs?  Do not leave any items on the stairs.  Make sure that there are handrails on both sides of the stairs and use them. Fix handrails that are broken or loose. Make sure that handrails are as long as the stairways.  Check any carpeting to make sure that it is firmly attached to the stairs. Fix any carpet that is loose or worn.  Avoid having throw rugs at the top or bottom of the stairs. If you do have throw rugs, attach them to the floor with carpet tape.  Make sure that you have a light switch at the top of the stairs and the bottom of the stairs. If you do not have them, ask someone to add them for you. What else can I do to help prevent falls?  Wear shoes that:  Do not have high heels.  Have rubber bottoms.  Are comfortable and fit you well.  Are closed at the toe. Do not wear sandals.  If you use a stepladder:  Make  sure that it is fully opened. Do not climb a closed stepladder.  Make sure that both sides of the stepladder are locked into place.  Ask someone to hold it for you, if possible.  Clearly mark and make sure that you can see:  Any grab bars or handrails.  First and last steps.  Where the edge of each step is.  Use tools that help you move around (mobility aids) if they are needed. These include:  Canes.  Walkers.  Scooters.  Crutches.  Turn on the lights when you go into a dark area. Replace any light bulbs as soon as they burn out.  Set up your furniture so you have a clear path. Avoid moving your furniture around.  If any of your floors are uneven, fix them.  If there are any pets around you, be aware of where they are.  Review your medicines with your doctor. Some medicines can make you feel dizzy. This can increase your chance of falling. Ask your doctor what other things that you can do to help prevent falls. This information is not intended to replace advice given to you by your health care provider. Make sure you discuss any questions you have with your health care provider. Document Released: 02/27/2009 Document Revised: 10/09/2015 Document Reviewed: 06/07/2014 Elsevier Interactive Patient Education  2017 [employer].

## 2019-04-18 DIAGNOSIS — M069 Rheumatoid arthritis, unspecified: Secondary | ICD-10-CM | POA: Diagnosis not present

## 2019-04-18 DIAGNOSIS — Z79899 Other long term (current) drug therapy: Secondary | ICD-10-CM | POA: Diagnosis not present

## 2019-04-18 DIAGNOSIS — M170 Bilateral primary osteoarthritis of knee: Secondary | ICD-10-CM | POA: Diagnosis not present

## 2019-04-18 DIAGNOSIS — L853 Xerosis cutis: Secondary | ICD-10-CM | POA: Diagnosis not present

## 2019-04-18 DIAGNOSIS — M059 Rheumatoid arthritis with rheumatoid factor, unspecified: Secondary | ICD-10-CM | POA: Diagnosis not present

## 2019-04-18 DIAGNOSIS — M1612 Unilateral primary osteoarthritis, left hip: Secondary | ICD-10-CM | POA: Diagnosis not present

## 2019-05-03 ENCOUNTER — Other Ambulatory Visit: Payer: Self-pay

## 2019-05-04 ENCOUNTER — Ambulatory Visit (INDEPENDENT_AMBULATORY_CARE_PROVIDER_SITE_OTHER): Payer: Medicare Other | Admitting: Nurse Practitioner

## 2019-05-04 ENCOUNTER — Encounter: Payer: Self-pay | Admitting: Nurse Practitioner

## 2019-05-04 ENCOUNTER — Other Ambulatory Visit: Payer: Self-pay

## 2019-05-04 VITALS — BP 135/72 | HR 80 | Temp 99.0°F | Ht 65.0 in | Wt 232.0 lb

## 2019-05-04 DIAGNOSIS — Z79899 Other long term (current) drug therapy: Secondary | ICD-10-CM

## 2019-05-04 DIAGNOSIS — Z0000 Encounter for general adult medical examination without abnormal findings: Secondary | ICD-10-CM | POA: Diagnosis not present

## 2019-05-04 DIAGNOSIS — I10 Essential (primary) hypertension: Secondary | ICD-10-CM | POA: Diagnosis not present

## 2019-05-04 DIAGNOSIS — E559 Vitamin D deficiency, unspecified: Secondary | ICD-10-CM

## 2019-05-04 DIAGNOSIS — E782 Mixed hyperlipidemia: Secondary | ICD-10-CM

## 2019-05-04 DIAGNOSIS — M057A Rheumatoid arthritis with rheumatoid factor of other specified site without organ or systems involvement: Secondary | ICD-10-CM | POA: Diagnosis not present

## 2019-05-04 DIAGNOSIS — R748 Abnormal levels of other serum enzymes: Secondary | ICD-10-CM | POA: Diagnosis not present

## 2019-05-04 DIAGNOSIS — Z6838 Body mass index (BMI) 38.0-38.9, adult: Secondary | ICD-10-CM

## 2019-05-04 DIAGNOSIS — Z79631 Long term (current) use of antimetabolite agent: Secondary | ICD-10-CM

## 2019-05-04 NOTE — Assessment & Plan Note (Signed)
Recheck on labs today and if consistent trend upwards obtain u/s and refer to GI. 

## 2019-05-04 NOTE — Assessment & Plan Note (Signed)
Chronic, ongoing.  BP at goal on home readings and in office.   Will trial her without HCTZ due to urinary urgency with this, if improvement and BP remains stable with no edema present will maintain off.  If no edema, but some elevation in BP will increase Amlodipine to 10 MG.  Return in 4 weeks for follow-up.  CMP today.

## 2019-05-04 NOTE — Assessment & Plan Note (Signed)
Chronic, ongoing.  Continue collaboration with rheumatology + current medication regimen.  Check iron, folic acid, and CBC today.

## 2019-05-04 NOTE — Assessment & Plan Note (Signed)
Recommend continued focus on healthy diet choices and regular physical activity (30 minutes 5 days a week).  BMI 38.61.

## 2019-05-04 NOTE — Progress Notes (Signed)
BP 135/72 (BP Location: Left Arm, Cuff Size: Normal)   Pulse 80   Temp 99 F (37.2 C) (Oral)   Ht 5' 5"  (1.651 m)   Wt 232 lb (105.2 kg)   LMP  (LMP Unknown)   SpO2 96%   BMI 38.61 kg/m    Subjective:    Patient ID: Angela Watts, female    DOB: 03/02/1951, 68 y.o.   MRN: [id number]  HPI: Angela Watts is a 68 y.o. female presenting on 05/04/2019 for comprehensive medical examination. Current medical complaints include:none  She currently lives with: self Menopausal Symptoms: no , had hysterectomy in 2003  HYPERTENSION / HYPERLIPIDEMIA Continues on ASA, HCTZ, Amlodipine, + Crestor.  She reports the HCTZ causes issues with urination, having to urinate often and urgency.  Has been walking on treadmill 3-4 days a week and focusing on diet with weight loss present.  Has elevated Alk Phos consistent on CMP, denies any GI issues. Satisfied with current treatment? yes Duration of hypertension: chronic BP monitoring frequency: daily BP range: 130/70 range BP medication side effects: no Duration of hyperlipidemia: chronic Cholesterol medication side effects: no Cholesterol supplements: none Medication compliance: good compliance Aspirin: yes Recent stressors: no Recurrent headaches: no Visual changes: no Palpitations: no Dyspnea: no Chest pain: no Lower extremity edema: no Dizzy/lightheaded: no   RHEUMATOID ARTHRITIS: Followed by Dr. Herminio Heads and last seen 04/18/2019.  She is to continue Methotrexate 7 tablets once a week and daily folic acid.  She reports this does help with pain.    Depression Screen done today and results listed below:  Depression screen [facility] 2/9 05/04/2019 03/26/2019 03/23/2018 03/21/2017 03/19/2016  Decreased Interest 0 0 0 0 0  Down, Depressed, Hopeless 0 0 1 1 0  PHQ - 2 Score 0 0 1 1 0  Altered sleeping - - - 0 0  Tired, decreased energy - - - 1 0  Change in appetite - - - 1 0  Feeling bad or failure about yourself  - - - 0 0  Trouble  concentrating - - - 0 0  Moving slowly or fidgety/restless - - - 0 0  Suicidal thoughts - - - 0 0  PHQ-9 Score - - - 3 0  Difficult doing work/chores - - - Not difficult at all -    The patient does not have a history of falls. I did not complete a risk assessment for falls. A plan of care for falls was not documented.   Past Medical History:  Past Medical History:  Diagnosis Date  . Abnormal Pap smear of cervix   . Abscess of bursa, left hip   . Arthritis   . Colon adenomas   . GERD (gastroesophageal reflux disease)   . Hyperlipidemia   . Hypertension   . Psoriasis   . Rheumatoid arthritis flare [facility])     Surgical History:  Past Surgical History:  Procedure Laterality Date  . ABDOMINAL HYSTERECTOMY  2003  . BREAST BIOPSY Left 06/01/2016   [place] bx done by dr byrnett 6:00 2cmfn intraducal papilloma with calcs  . BREAST BIOPSY Left 02/05/2019   Affirm bx-"X" clip-path pending  . BREAST CYST ASPIRATION Left 2011   NEG/ Br Byrnett  . BREAST CYST ASPIRATION Left 06/01/2016   dr byrnett 5:00 3cmfn aspirated with complete resolution  . COLONOSCOPY WITH PROPOFOL N/A 06/11/2015   Procedure: COLONOSCOPY WITH PROPOFOL;  Surgeon: Manya Silvas, MD;  Location[facility] ENDOSCOPY;  Service: Endoscopy;  Laterality: N/A;  .  COLONOSCOPY WITH PROPOFOL N/A 07/14/2018   Procedure: COLONOSCOPY WITH PROPOFOL;  Surgeon: Manya Silvas, MD;  Location: [facility] ENDOSCOPY;  Service: Endoscopy;  Laterality: N/A;  . CYST EXCISION Left    breast    Medications:  Current Outpatient Medications on File Prior to Visit  Medication Sig  . amLODipine (NORVASC) 5 MG tablet Take 1 tablet by mouth once daily  . aspirin EC 81 MG tablet Take 81 mg by mouth daily.  . folic acid (FOLVITE) 1 MG tablet Take 1 tablet by mouth daily.  . methotrexate (RHEUMATREX) 2.5 MG tablet Take 7 tabs (17.5 MG ) once a week for 12 weeks  . Multiple Vitamin (MULTIVITAMIN) capsule Take 1 capsule by mouth daily.  . rosuvastatin  (CRESTOR) 20 MG tablet Take one tablet (20 MG) on Monday, Wednesday, and Friday at 6 PM.   No current facility-administered medications on file prior to visit.    Allergies:  Allergies  Allergen Reactions  . Prednisone Itching    Social History:  Social History   Socioeconomic History  . Marital status: Single    Spouse name: Not on file  . Number of children: Not on file  . Years of education: Not on file  . Highest education level: Not on file  Occupational History  . Not on file  Tobacco Use  . Smoking status: Never Smoker  . Smokeless tobacco: Never Used  Substance and Sexual Activity  . Alcohol use: Yes    Alcohol/week: 0.0 standard drinks    Comment: on occasion/socially  . Drug use: No  . Sexual activity: Never  Other Topics Concern  . Not on file  Social History Narrative  . Not on file   Social Determinants of Health   Financial Resource Strain:   . Difficulty of Paying Living Expenses: Not on file  Food Insecurity:   . Worried About Charity fundraiser in the Last Year: Not on file  . Ran Out of Food in the Last Year: Not on file  Transportation Needs:   . Lack of Transportation (Medical): Not on file  . Lack of Transportation (Non-Medical): Not on file  Physical Activity: Insufficiently Active  . Days of Exercise per Week: 4 days  . Minutes of Exercise per Session: 30 min  Stress:   . Feeling of Stress : Not on file  Social Connections:   . Frequency of Communication with Friends and Family: Not on file  . Frequency of Social Gatherings with Friends and Family: Not on file  . Attends Religious Services: Not on file  . Active Member of Clubs or Organizations: Not on file  . Attends Archivist Meetings: Not on file  . Marital Status: Not on file  Intimate Partner Violence:   . Fear of Current or Ex-Partner: Not on file  . Emotionally Abused: Not on file  . Physically Abused: Not on file  . Sexually Abused: Not on file   Social History    Tobacco Use  Smoking Status Never Smoker  Smokeless Tobacco Never Used   Social History   Substance and Sexual Activity  Alcohol Use Yes  . Alcohol/week: 0.0 standard drinks   Comment: on occasion/socially    Family History:  Family History  Problem Relation Age of Onset  . Hypertension Mother   . Arthritis Mother   . Breast cancer Mother   . Cancer Mother        breast  . Hypertension Father   . Lung cancer Father   .  Diabetes Sister   . Hypertension Sister   . Hypertension Brother   . Hypertension Sister   . Hypertension Sister   . Hypertension Sister   . Hypertension Brother   . Cancer Brother        lung    Past medical history, surgical history, medications, allergies, family history and social history reviewed with patient today and changes made to appropriate areas of the chart.   Review of Systems - negative All other ROS negative except what is listed above and in the HPI.      Objective:    BP 135/72 (BP Location: Left Arm, Cuff Size: Normal)   Pulse 80   Temp 99 F (37.2 C) (Oral)   Ht 5' 5"  (1.651 m)   Wt 232 lb (105.2 kg)   LMP  (LMP Unknown)   SpO2 96%   BMI 38.61 kg/m   Wt Readings from Last 3 Encounters:  05/04/19 232 lb (105.2 kg)  03/26/19 238 lb 6.4 oz (108.1 kg)  02/07/19 237 lb 9.6 oz (107.8 kg)    Physical Exam Constitutional:      General: She is awake. She is not in acute distress.    Appearance: She is well-developed. She is not ill-appearing.  HENT:     Head: Normocephalic and atraumatic.     Right Ear: Hearing, tympanic membrane, ear canal and external ear normal. No drainage.     Left Ear: Hearing, tympanic membrane, ear canal and external ear normal. No drainage.     Nose: Nose normal.     Right Sinus: No maxillary sinus tenderness or frontal sinus tenderness.     Left Sinus: No maxillary sinus tenderness or frontal sinus tenderness.     Mouth/Throat:     Mouth: Mucous membranes are moist.     Pharynx: Oropharynx is  clear. Uvula midline. No pharyngeal swelling, oropharyngeal exudate or posterior oropharyngeal erythema.  Eyes:     General: Lids are normal.        Right eye: No discharge.        Left eye: No discharge.     Extraocular Movements: Extraocular movements intact.     Conjunctiva/sclera: Conjunctivae normal.     Pupils: Pupils are equal, round, and reactive to light.     Visual Fields: Right eye visual fields normal and left eye visual fields normal.  Neck:     Thyroid: No thyromegaly.     Vascular: No carotid bruit.     Trachea: Trachea normal.  Cardiovascular:     Rate and Rhythm: Normal rate and regular rhythm.     Heart sounds: Normal heart sounds. No murmur. No gallop.   Pulmonary:     Effort: Pulmonary effort is normal. No accessory muscle usage or respiratory distress.     Breath sounds: Normal breath sounds.  Chest:     Breasts:        Right: Normal.        Left: Normal.  Abdominal:     General: Bowel sounds are normal.     Palpations: Abdomen is soft. There is no hepatomegaly or splenomegaly.     Tenderness: There is no abdominal tenderness.  Musculoskeletal:        General: Normal range of motion.     Cervical back: Normal range of motion and neck supple.     Right lower leg: No edema.     Left lower leg: No edema.  Lymphadenopathy:     Head:  Right side of head: No submental, submandibular, tonsillar, preauricular or posterior auricular adenopathy.     Left side of head: No submental, submandibular, tonsillar, preauricular or posterior auricular adenopathy.     Cervical: No cervical adenopathy.     Upper Body:     Right upper body: No supraclavicular, axillary or pectoral adenopathy.     Left upper body: No supraclavicular, axillary or pectoral adenopathy.  Skin:    General: Skin is warm and dry.     Capillary Refill: Capillary refill takes less than 2 seconds.     Findings: No rash.  Neurological:     Mental Status: She is alert and oriented to person, place,  and time.     Cranial Nerves: Cranial nerves are intact.     Gait: Gait is intact.     Deep Tendon Reflexes: Reflexes are normal and symmetric.     Reflex Scores:      Brachioradialis reflexes are 2+ on the right side and 2+ on the left side.      Patellar reflexes are 2+ on the right side and 2+ on the left side. Psychiatric:        Attention and Perception: Attention normal.        Mood and Affect: Mood normal.        Speech: Speech normal.        Behavior: Behavior normal. Behavior is cooperative.        Thought Content: Thought content normal.        Judgment: Judgment normal.     Results for orders placed or performed during the hospital encounter of 02/05/19  Surgical pathology  Result Value Ref Range   SURGICAL PATHOLOGY      SURGICAL PATHOLOGY CASE: [id number] PATIENT: Varney Biles Surgical Pathology Report     Specimen Submitted: A. Breast, left, LOQ  Clinical History: LT breast OLQ indeterminate 7 mm oval mass.  Suspect B9 APO MET, FCC, etc; X-shaped clip placed following stereotactic biopsy    DIAGNOSIS: A. 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 - NEGATIVE FOR ATYPICAL PROLIFERATIVE BREAST DISEASE.  GROSS DESCRIPTION: A. Labeled: Left breast outer lower quadrant stereotactic biopsy Received: in a formalin-filled CoreTainer transport system Accompanying specimen radiograph: No Time / date in fixative: Tissue procedure time 9 AM, tissue put in formalin time 9:04 AM 02/05/2019 Cold ischemic time: 4 minutes Total fixation time: 11 hours Core pieces: Multiple Measurement: From 0.5 to 1.0 cm in length and 0.1 cm in width Description / comments: Fibrofatty tissue cores Inke d: Green Entirely submitted. Block summary: 1-5-entirely submitted breast cores   Final Diagnosis performed by Allena Napoleon, MD.   Electronically signed 02/06/2019 2:51:39PM The  electronic signature indicates that the named Attending Pathologist has evaluated the specimen Technical component performed at [facility], [address], [place], [place] [address] Lab: [phone] Dir: Rush Farmer, MD, MMM  Professional component performed at [facility], [facility], [place], [place], [place] [address] Lab: [phone] Dir: Dellia Nims. Reuel Derby, MD       Assessment & Plan:   Problem List Items Addressed This Visit      Cardiovascular and Mediastinum   Hypertension    Chronic, ongoing.  BP at goal on home readings and in office.   Will trial her without HCTZ due to urinary urgency with this, if improvement and BP remains stable with no edema present will maintain off.  If no edema, but  some elevation in BP will increase Amlodipine to 10 MG.  Return in 4 weeks for follow-up.  CMP today.      Relevant Orders   Comprehensive metabolic panel     Musculoskeletal and Integument   RA (rheumatoid arthritis) (HCC)    Chronic, ongoing.  Continue collaboration with rheumatology + current medication regimen.  Check iron, folic acid, and CBC today.        Relevant Orders   CBC with Differential/Platelet out   Comprehensive metabolic panel   Vit D  25 hydroxy (rtn osteoporosis monitoring)   Folate   Iron and TIBC     Other   Hyperlipidemia    Chronic, ongoing.  Continue current medication regimen and adjust as needed.  Lipid panel and CMP today.      Relevant Orders   Comprehensive metabolic panel   Lipid Panel w/o Chol/HDL Ratio out   Obesity    Recommend continued focus on healthy diet choices and regular physical activity (30 minutes 5 days a week).  BMI 38.61.       Elevated alkaline phosphatase level    Recheck on labs today and if consistent trend upwards obtain u/s and refer to GI.      Long term methotrexate user    Check iron, folic, acid, and CBC today.      Relevant Orders   Folate   Iron and TIBC    Other Visit Diagnoses     Annual physical exam    -  Primary   Check annual labs to include CBC, TSH, lipid, CMP   Relevant Orders   TSH   Vitamin D deficiency       History of low Vit D level reported, check on this today.   Relevant Orders   Vit D  25 hydroxy (rtn osteoporosis monitoring)       Follow up plan: Return in about 4 weeks (around 06/01/2019) for HTN.   LABORATORY TESTING:  - Pap smear: not applicable  IMMUNIZATIONS:   - Tdap: Tetanus vaccination status reviewed: last tetanus booster within 10 years. - Influenza: Up to date - Pneumovax: Up to date - Prevnar: Up to date - HPV: Not applicable - Zostavax vaccine: Up to date  SCREENING: -Mammogram: Up to date  - Colonoscopy: Up to date  - Bone Density: Up to date , in 2017 and was normal -Hearing Test: Not applicable  -Spirometry: Not applicable   PATIENT COUNSELING:   Advised to take 1 mg of folate supplement per day if capable of pregnancy.   Sexuality: Discussed sexually transmitted diseases, partner selection, use of condoms, avoidance of unintended pregnancy  and contraceptive alternatives.   Advised to avoid cigarette smoking.  I discussed with the patient that most people either abstain from alcohol or drink within safe limits (<=14/week and <=4 drinks/occasion for males, <=7/weeks and <= 3 drinks/occasion for females) and that the risk for alcohol disorders and other health effects rises proportionally with the number of drinks per week and how often a drinker exceeds daily limits.  Discussed cessation/primary prevention of drug use and availability of treatment for abuse.   Diet: Encouraged to adjust caloric intake to maintain  or achieve ideal body weight, to reduce intake of dietary saturated fat and total fat, to limit sodium intake by avoiding high sodium foods and not adding table salt, and to maintain adequate dietary potassium and calcium preferably from fresh fruits, vegetables, and low-fat dairy products.    stressed  the importance of  regular exercise  Injury prevention: Discussed safety belts, safety helmets, smoke detector, smoking near bedding or upholstery.   Dental health: Discussed importance of regular tooth brushing, flossing, and dental visits.    NEXT PREVENTATIVE PHYSICAL DUE IN 1 YEAR. Return in about 4 weeks (around 06/01/2019) for HTN.

## 2019-05-04 NOTE — Assessment & Plan Note (Signed)
Chronic, ongoing.  Continue current medication regimen and adjust as needed.  Lipid panel and CMP today.    

## 2019-05-04 NOTE — Patient Instructions (Signed)
Rheumatoid Arthritis Rheumatoid arthritis (RA) is a long-term (chronic) disease. RA causes inflammation in your joints. Your joints may feel painful, stiff, swollen, and warm. RA may start slowly. It most often affects the small joints of the hands and feet. It can also affect other parts of the body. Symptoms of RA often come and go. There is no cure for RA, but medicines can help your symptoms. What are the causes?  RA is an autoimmune disease. This means that your body's defense system (immune system) attacks healthy parts of your body by mistake. The exact cause of RA is not known. What increases the risk?  Being a woman.  Having a family history of RA or other diseases like RA.  Smoking.  Being overweight.  Being exposed to pollutants or chemicals. What are the signs or symptoms?  Morning stiffness that lasts longer than 30 minutes. This is often the first symptom.  Symptoms start slowly. They are often worse in the morning.  As RA gets worse, symptoms may include: ? Pain, stiffness, swelling, warmth, and tenderness in joints on both sides of your body. ? Loss of energy. ? Not feeling hungry. ? Weight loss. ? A low fever. ? Dry eyes and a dry mouth. ? Firm lumps that grow under your skin. ? Changes in the way your joints look. ? Changes in the way your joints work.  Symptoms vary and they: ? Often come and go. ? Sometimes get worse for a period of time. These are called flares. How is this treated?   Treatment may include: ? Taking good care of yourself. Be sure to rest as needed, eat a healthy diet, and exercise. ? Medicines. These may include:  Pain relievers.  Medicines to help with inflammation.  Disease-modifying antirheumatic drugs (DMARDs).  Medicines called biologic response modifiers. ? Physical therapy and occupational therapy. ? Surgery, if joint damage is very bad. Your doctor will work with you to find the best treatments. Follow these  instructions at home: Activity  Return to your normal activities as told by your doctor. Ask your doctor what activities are safe for you.  Rest when you have a flare.  Exercise as told by your doctor. General instructions  Take over-the-counter and prescription medicines only as told by your doctor.  Keep all follow-up visits as told by your doctor. This is important. Where to find more information  [employer] of Rheumatology: www.rheumatology.[place]: www.arthritis.org Contact a doctor if:  You have a flare.  You have a fever.  You have problems because of your medicines. Get help right away if:  You have chest pain.  You have trouble breathing.  You get a hot, painful joint all of a sudden, and it is worse than your normal joint aches. Summary  RA is a long-term disease.  Symptoms of RA start slowly. They are often worse in the morning.  RA causes inflammation in your joints. This information is not intended to replace advice given to you by your health care provider. Make sure you discuss any questions you have with your health care provider. Document Released: 07/26/2011 Document Revised: 01/04/2018 Document Reviewed: 01/04/2018 Elsevier Patient Education  2020 [employer].

## 2019-05-04 NOTE — Assessment & Plan Note (Signed)
Check iron, folic, acid, and CBC today.

## 2019-05-05 LAB — CBC WITH DIFFERENTIAL/PLATELET
Basophils Absolute: 0 10*3/uL (ref 0.0–0.2)
Basos: 1 %
EOS (ABSOLUTE): 0.1 10*3/uL (ref 0.0–0.4)
Eos: 3 %
Hematocrit: 39.5 % (ref 34.0–46.6)
Hemoglobin: 13.3 g/dL (ref 11.1–15.9)
Immature Grans (Abs): 0 10*3/uL (ref 0.0–0.1)
Immature Granulocytes: 0 %
Lymphocytes Absolute: 1.4 10*3/uL (ref 0.7–3.1)
Lymphs: 34 %
MCH: 29.8 pg (ref 26.6–33.0)
MCHC: 33.7 g/dL (ref 31.5–35.7)
MCV: 89 fL (ref 79–97)
Monocytes Absolute: 0.5 10*3/uL (ref 0.1–0.9)
Monocytes: 11 %
Neutrophils Absolute: 2.1 10*3/uL (ref 1.4–7.0)
Neutrophils: 51 %
Platelets: 338 10*3/uL (ref 150–450)
RBC: 4.46 x10E6/uL (ref 3.77–5.28)
RDW: 14.5 % (ref 11.7–15.4)
WBC: 4.2 10*3/uL (ref 3.4–10.8)

## 2019-05-05 LAB — LIPID PANEL W/O CHOL/HDL RATIO
Cholesterol, Total: 161 mg/dL (ref 100–199)
HDL: 57 mg/dL (ref >39)
LDL Chol Calc (NIH): 93 mg/dL (ref 0–99)
Triglycerides: 55 mg/dL (ref 0–149)
VLDL Cholesterol Cal: 11 mg/dL (ref 5–40)

## 2019-05-05 LAB — COMPREHENSIVE METABOLIC PANEL
ALT: 17 IU/L (ref 0–32)
AST: 21 IU/L (ref 0–40)
Albumin/Globulin Ratio: 1.3 (ref 1.2–2.2)
Albumin: 4.2 g/dL (ref 3.8–4.8)
Alkaline Phosphatase: 171 IU/L — ABNORMAL HIGH (ref 39–117)
BUN/Creatinine Ratio: 16 (ref 12–28)
BUN: 13 mg/dL (ref 8–27)
Bilirubin Total: 0.4 mg/dL (ref 0.0–1.2)
CO2: 25 mmol/L (ref 20–29)
Calcium: 9.3 mg/dL (ref 8.7–10.3)
Chloride: 101 mmol/L (ref 96–106)
Creatinine, Ser: 0.81 mg/dL (ref 0.57–1.00)
GFR calc Af Amer: 85 mL/min/{1.73_m2} (ref >59)
GFR calc non Af Amer: 74 mL/min/{1.73_m2} (ref >59)
Globulin, Total: 3.2 g/dL (ref 1.5–4.5)
Glucose: 97 mg/dL (ref 65–99)
Potassium: 3.7 mmol/L (ref 3.5–5.2)
Sodium: 139 mmol/L (ref 134–144)
Total Protein: 7.4 g/dL (ref 6.0–8.5)

## 2019-05-05 LAB — IRON AND TIBC
Iron Saturation: 28 % (ref 15–55)
Iron: 98 ug/dL (ref 27–139)
Total Iron Binding Capacity: 344 ug/dL (ref 250–450)
UIBC: 246 ug/dL (ref 118–369)

## 2019-05-05 LAB — VITAMIN D 25 HYDROXY (VIT D DEFICIENCY, FRACTURES): Vit D, 25-Hydroxy: 33.1 ng/mL (ref 30.0–100.0)

## 2019-05-05 LAB — FOLATE: Folate: >20 ng/mL (ref >3.0)

## 2019-05-05 LAB — TSH: TSH: 1.73 u[IU]/mL (ref 0.450–4.500)

## 2019-05-06 NOTE — Progress Notes (Signed)
Good morning.  Please let Angela Watts know: - Alkaline phosphatase, we look at for gallbladder or bone, remains slightly elevated but is staying stable over past 4 years with no significant increase.  We will continue to monitor this on labs closely. - Kidney and liver function look good - No anemia present. - Vitamin D level in good range and thyroid testing normal. - Great news, cholesterol levels are good with taking medication a few days a week.  If tolerating this, then continue this regimen as it is helping.   Have a Merry Christmas and Happy New Year!!

## 2019-05-31 ENCOUNTER — Other Ambulatory Visit: Payer: Self-pay | Admitting: Nurse Practitioner

## 2019-05-31 NOTE — Telephone Encounter (Signed)
Patient has appointment tomorrow

## 2019-06-01 ENCOUNTER — Ambulatory Visit (INDEPENDENT_AMBULATORY_CARE_PROVIDER_SITE_OTHER): Payer: Medicare Other | Admitting: Nurse Practitioner

## 2019-06-01 ENCOUNTER — Other Ambulatory Visit: Payer: Self-pay

## 2019-06-01 ENCOUNTER — Encounter: Payer: Self-pay | Admitting: Nurse Practitioner

## 2019-06-01 DIAGNOSIS — I10 Essential (primary) hypertension: Secondary | ICD-10-CM

## 2019-06-01 NOTE — Progress Notes (Signed)
BP (!) 151/71   Pulse 80   Temp 97.9 F (36.6 C)   LMP  (LMP Unknown)    Subjective:    Patient ID: Angela Watts, female    DOB: [birth year], 69 y.o.   MRN: [id number]  HPI: Angela Watts is a 69 y.o. female  Chief Complaint  Patient presents with  . Hypertension    4 wk follow up    . This visit was completed via Doximity due to the restrictions of the COVID-19 pandemic. All issues as above were discussed and addressed. Physical exam was done as above through visual confirmation on Doximity. If it was felt that the patient should be evaluated in the office, they were directed there. The patient verbally consented to this visit. . Location of the patient: home . Location of the provider: work . Those involved with this call:  . Provider: Marnee Guarneri, DNP . CMA: Yvonna Alanis, CMA . Front Desk/Registration: Don Perking  . Time spent on call: 15 minutes with patient face to face via video conference. More than 50% of this time was spent in counseling and coordination of care. 10 minutes total spent in review of patient's record and preparation of their chart.  . I verified patient identity using two factors (patient name and date of birth). Patient consents verbally to being seen via telemedicine visit today.    HYPERTENSION Take off HCTZ at last visit for trial due to complaint of urinary urgency.  Has not taken medication yet this morning.  She reports improved urinary urgency with discontinuation of HCTZ.  On average her BPs have been below 130/80 with change. Hypertension status: stable  Satisfied with current treatment? yes Duration of hypertension: chronic BP monitoring frequency:  daily BP range: 130/80 on average BP medication side effects:  no Medication compliance: good compliance Previous BP meds: HCTZ and Amlodipine Aspirin: yes Recurrent headaches: no Visual changes: no Palpitations: no Dyspnea: no Chest pain: no Lower extremity edema:  no Dizzy/lightheaded: no  Relevant past medical, surgical, family and social history reviewed and updated as indicated. Interim medical history since our last visit reviewed. Allergies and medications reviewed and updated.  Review of Systems  Constitutional: Negative for activity change, appetite change, diaphoresis, fatigue and fever.  Respiratory: Negative for cough, chest tightness and shortness of breath.   Cardiovascular: Negative for chest pain, palpitations and leg swelling.  Genitourinary: Negative for urgency.  Neurological: Negative.   Psychiatric/Behavioral: Negative.     Per HPI unless specifically indicated above     Objective:    BP (!) 151/71   Pulse 80   Temp 97.9 F (36.6 C)   LMP  (LMP Unknown)   Wt Readings from Last 3 Encounters:  05/04/19 232 lb (105.2 kg)  03/26/19 238 lb 6.4 oz (108.1 kg)  02/07/19 237 lb 9.6 oz (107.8 kg)    Physical Exam Vitals and nursing note reviewed.  Constitutional:      General: She is awake. She is not in acute distress.    Appearance: She is well-developed. She is obese. She is not ill-appearing.  HENT:     Head: Normocephalic.     Right Ear: Hearing normal.     Left Ear: Hearing normal.  Eyes:     General: Lids are normal.        Right eye: No discharge.        Left eye: No discharge.     Conjunctiva/sclera: Conjunctivae normal.  Pulmonary:  Effort: Pulmonary effort is normal. No accessory muscle usage or respiratory distress.  Musculoskeletal:     Cervical back: Normal range of motion.  Neurological:     Mental Status: She is alert and oriented to person, place, and time.  Psychiatric:        Attention and Perception: Attention normal.        Mood and Affect: Mood normal.        Behavior: Behavior normal. Behavior is cooperative.        Thought Content: Thought content normal.        Judgment: Judgment normal.     Results for orders placed or performed in visit on 05/04/19  CBC with  Differential/Platelet out  Result Value Ref Range   WBC 4.2 3.4 - 10.8 x10E3/uL   RBC 4.46 3.77 - 5.28 x10E6/uL   Hemoglobin 13.3 11.1 - 15.9 g/dL   Hematocrit 39.5 34.0 - 46.6 %   MCV 89 79 - 97 fL   MCH 29.8 26.6 - 33.0 pg   MCHC 33.7 31.5 - 35.7 g/dL   RDW 14.5 11.7 - 15.4 %   Platelets 338 150 - 450 x10E3/uL   Neutrophils 51 Not Estab. %   Lymphs 34 Not Estab. %   Monocytes 11 Not Estab. %   Eos 3 Not Estab. %   Basos 1 Not Estab. %   Neutrophils Absolute 2.1 1.4 - 7.0 x10E3/uL   Lymphocytes Absolute 1.4 0.7 - 3.1 x10E3/uL   Monocytes Absolute 0.5 0.1 - 0.9 x10E3/uL   EOS (ABSOLUTE) 0.1 0.0 - 0.4 x10E3/uL   Basophils Absolute 0.0 0.0 - 0.2 x10E3/uL   Immature Granulocytes 0 Not Estab. %   Immature Grans (Abs) 0.0 0.0 - 0.1 x10E3/uL  Comprehensive metabolic panel  Result Value Ref Range   Glucose 97 65 - 99 mg/dL   BUN 13 8 - 27 mg/dL   Creatinine, Ser 0.81 0.57 - 1.00 mg/dL   GFR calc non Af Amer 74 >59 mL/min/1.73   GFR calc Af Amer 85 >59 mL/min/1.73   BUN/Creatinine Ratio 16 12 - 28   Sodium 139 134 - 144 mmol/L   Potassium 3.7 3.5 - 5.2 mmol/L   Chloride 101 96 - 106 mmol/L   CO2 25 20 - 29 mmol/L   Calcium 9.3 8.7 - 10.3 mg/dL   Total Protein 7.4 6.0 - 8.5 g/dL   Albumin 4.2 3.8 - 4.8 g/dL   Globulin, Total 3.2 1.5 - 4.5 g/dL   Albumin/Globulin Ratio 1.3 1.2 - 2.2   Bilirubin Total 0.4 0.0 - 1.2 mg/dL   Alkaline Phosphatase 171 (H) 39 - 117 IU/L   AST 21 0 - 40 IU/L   ALT 17 0 - 32 IU/L  TSH  Result Value Ref Range   TSH 1.730 0.450 - 4.500 uIU/mL  Vit D  25 hydroxy (rtn osteoporosis monitoring)  Result Value Ref Range   Vit D, 25-Hydroxy 33.1 30.0 - 100.0 ng/mL  Lipid Panel w/o Chol/HDL Ratio out  Result Value Ref Range   Cholesterol, Total 161 100 - 199 mg/dL   Triglycerides 55 0 - 149 mg/dL   HDL 57 >39 mg/dL   VLDL Cholesterol Cal 11 5 - 40 mg/dL   LDL Chol Calc (NIH) 93 0 - 99 mg/dL  Folate  Result Value Ref Range   Folate >20.0 >3.0 ng/mL    Iron and TIBC  Result Value Ref Range   Total Iron Binding Capacity 344 250 - 450 ug/dL  UIBC 246 118 - 369 ug/dL   Iron 98 27 - 139 ug/dL   Iron Saturation 28 15 - 55 %      Assessment & Plan:   Problem List Items Addressed This Visit      Cardiovascular and Mediastinum   Hypertension    Chronic, stable without HCTZ.  BP slightly elevated this morning, has not taken medication, on average has been <130/80.  Will contiue Amlodipine 5 MG daily and if elevations noted in upcoming months will increase to 10 MG.  Remain off HCTZ due to complaint urinary urgency with this.  Return in June for follow-up visit.         I discussed the assessment and treatment plan with the patient. The patient was provided an opportunity to ask questions and all were answered. The patient agreed with the plan and demonstrated an understanding of the instructions.   The patient was advised to call back or seek an in-person evaluation if the symptoms worsen or if the condition fails to improve as anticipated.   I provided 15 minutes of time during this encounter.  Follow up plan: Return in about 5 months (around 10/30/2019) for HTN/HLD, OA, RA, Alk phos check.

## 2019-06-01 NOTE — Assessment & Plan Note (Signed)
Chronic, stable without HCTZ.  BP slightly elevated this morning, has not taken medication, on average has been <130/80.  Will contiue Amlodipine 5 MG daily and if elevations noted in upcoming months will increase to 10 MG.  Remain off HCTZ due to complaint urinary urgency with this.  Return in June for follow-up visit.

## 2019-06-01 NOTE — Patient Instructions (Signed)
DASH Eating Plan DASH stands for "Dietary Approaches to Stop Hypertension." The DASH eating plan is a healthy eating plan that has been shown to reduce high blood pressure (hypertension). It may also reduce your risk for type 2 diabetes, heart disease, and stroke. The DASH eating plan may also help with weight loss. What are tips for following this plan?  General guidelines  Avoid eating more than 2,300 mg (milligrams) of salt (sodium) a day. If you have hypertension, you may need to reduce your sodium intake to 1,500 mg a day.  Limit alcohol intake to no more than 1 drink a day for nonpregnant women and 2 drinks a day for men. One drink equals 12 oz of beer, 5 oz of wine, or 1 oz of hard liquor.  Work with your health care provider to maintain a healthy body weight or to lose weight. Ask what an ideal weight is for you.  Get at least 30 minutes of exercise that causes your heart to beat faster (aerobic exercise) most days of the week. Activities may include walking, swimming, or biking.  Work with your health care provider or diet and nutrition specialist (dietitian) to adjust your eating plan to your individual calorie needs. Reading food labels   Check food labels for the amount of sodium per serving. Choose foods with less than 5 percent of the Daily Value of sodium. Generally, foods with less than 300 mg of sodium per serving fit into this eating plan.  To find whole grains, look for the word "whole" as the first word in the ingredient list. Shopping  Buy products labeled as "low-sodium" or "no salt added."  Buy fresh foods. Avoid canned foods and premade or frozen meals. Cooking  Avoid adding salt when cooking. Use salt-free seasonings or herbs instead of table salt or sea salt. Check with your health care provider or pharmacist before using salt substitutes.  Do not fry foods. Cook foods using healthy methods such as baking, boiling, grilling, and broiling instead.  Cook with  heart-healthy oils, such as olive, canola, soybean, or sunflower oil. Meal planning  Eat a balanced diet that includes: ? 5 or more servings of fruits and vegetables each day. At each meal, try to fill half of your plate with fruits and vegetables. ? Up to 6-8 servings of whole grains each day. ? Less than 6 oz of lean meat, poultry, or fish each day. A 3-oz serving of meat is about the same size as a deck of cards. One egg equals 1 oz. ? 2 servings of low-fat dairy each day. ? A serving of nuts, seeds, or beans 5 times each week. ? Heart-healthy fats. Healthy fats called Omega-3 fatty acids are found in foods such as flaxseeds and coldwater fish, like sardines, salmon, and mackerel.  Limit how much you eat of the following: ? Canned or prepackaged foods. ? Food that is high in trans fat, such as fried foods. ? Food that is high in saturated fat, such as fatty meat. ? Sweets, desserts, sugary drinks, and other foods with added sugar. ? Full-fat dairy products.  Do not salt foods before eating.  Try to eat at least 2 vegetarian meals each week.  Eat more home-cooked food and less restaurant, buffet, and fast food.  When eating at a restaurant, ask that your food be prepared with less salt or no salt, if possible. What foods are recommended? The items listed may not be a complete list. Talk with your dietitian about   what dietary choices are best for you. Grains Whole-grain or whole-wheat bread. Whole-grain or whole-wheat pasta. Brown rice. Oatmeal. Quinoa. Bulgur. Whole-grain and low-sodium cereals. Pita bread. Low-fat, low-sodium crackers. Whole-wheat flour tortillas. Vegetables Fresh or frozen vegetables (raw, steamed, roasted, or grilled). Low-sodium or reduced-sodium tomato and vegetable juice. Low-sodium or reduced-sodium tomato sauce and tomato paste. Low-sodium or reduced-sodium canned vegetables. Fruits All fresh, dried, or frozen fruit. Canned fruit in natural juice (without  added sugar). Meat and other protein foods Skinless chicken or turkey. Ground chicken or turkey. Pork with fat trimmed off. Fish and seafood. Egg whites. Dried beans, peas, or lentils. Unsalted nuts, nut butters, and seeds. Unsalted canned beans. Lean cuts of beef with fat trimmed off. Low-sodium, lean deli meat. Dairy Low-fat (1%) or fat-free (skim) milk. Fat-free, low-fat, or reduced-fat cheeses. Nonfat, low-sodium ricotta or cottage cheese. Low-fat or nonfat yogurt. Low-fat, low-sodium cheese. Fats and oils Soft margarine without trans fats. Vegetable oil. Low-fat, reduced-fat, or light mayonnaise and salad dressings (reduced-sodium). Canola, safflower, olive, soybean, and sunflower oils. Avocado. Seasoning and other foods Herbs. Spices. Seasoning mixes without salt. Unsalted popcorn and pretzels. Fat-free sweets. What foods are not recommended? The items listed may not be a complete list. Talk with your dietitian about what dietary choices are best for you. Grains Baked goods made with fat, such as croissants, muffins, or some breads. Dry pasta or rice meal packs. Vegetables Creamed or fried vegetables. Vegetables in a cheese sauce. Regular canned vegetables (not low-sodium or reduced-sodium). Regular canned tomato sauce and paste (not low-sodium or reduced-sodium). Regular tomato and vegetable juice (not low-sodium or reduced-sodium). Pickles. Olives. Fruits Canned fruit in a light or heavy syrup. Fried fruit. Fruit in cream or butter sauce. Meat and other protein foods Fatty cuts of meat. Ribs. Fried meat. Bacon. Sausage. Bologna and other processed lunch meats. Salami. Fatback. Hotdogs. Bratwurst. Salted nuts and seeds. Canned beans with added salt. Canned or smoked fish. Whole eggs or egg yolks. Chicken or turkey with skin. Dairy Whole or 2% milk, cream, and half-and-half. Whole or full-fat cream cheese. Whole-fat or sweetened yogurt. Full-fat cheese. Nondairy creamers. Whipped toppings.  Processed cheese and cheese spreads. Fats and oils Butter. Stick margarine. Lard. Shortening. Ghee. Bacon fat. Tropical oils, such as coconut, palm kernel, or palm oil. Seasoning and other foods Salted popcorn and pretzels. Onion salt, garlic salt, seasoned salt, table salt, and sea salt. Worcestershire sauce. Tartar sauce. Barbecue sauce. Teriyaki sauce. Soy sauce, including reduced-sodium. Steak sauce. Canned and packaged gravies. Fish sauce. Oyster sauce. Cocktail sauce. Horseradish that you find on the shelf. Ketchup. Mustard. Meat flavorings and tenderizers. Bouillon cubes. Hot sauce and Tabasco sauce. Premade or packaged marinades. Premade or packaged taco seasonings. Relishes. Regular salad dressings. Where to find more information:  National Heart, Lung, and Blood Institute: www.nhlbi.nih.gov  American Heart Association: www.heart.org Summary  The DASH eating plan is a healthy eating plan that has been shown to reduce high blood pressure (hypertension). It may also reduce your risk for type 2 diabetes, heart disease, and stroke.  With the DASH eating plan, you should limit salt (sodium) intake to 2,300 mg a day. If you have hypertension, you may need to reduce your sodium intake to 1,500 mg a day.  When on the DASH eating plan, aim to eat more fresh fruits and vegetables, whole grains, lean proteins, low-fat dairy, and heart-healthy fats.  Work with your health care provider or diet and nutrition specialist (dietitian) to adjust your eating plan to your   individual calorie needs. This information is not intended to replace advice given to you by your health care provider. Make sure you discuss any questions you have with your health care provider. Document Revised: 04/15/2017 Document Reviewed: 04/26/2016 Elsevier Patient Education  2020 Elsevier Inc.  

## 2019-06-07 ENCOUNTER — Encounter: Payer: Self-pay | Admitting: Nurse Practitioner

## 2019-06-07 ENCOUNTER — Ambulatory Visit (INDEPENDENT_AMBULATORY_CARE_PROVIDER_SITE_OTHER): Payer: Medicare Other | Admitting: Nurse Practitioner

## 2019-06-07 DIAGNOSIS — M1711 Unilateral primary osteoarthritis, right knee: Secondary | ICD-10-CM | POA: Diagnosis not present

## 2019-06-07 DIAGNOSIS — M25561 Pain in right knee: Secondary | ICD-10-CM

## 2019-06-07 NOTE — Patient Instructions (Signed)

## 2019-06-07 NOTE — Assessment & Plan Note (Signed)
Acute with swelling, redness, and warmth reported.  Concern for infection.  Due to technical difficulties, unable to perform physical face to face exam.  Have advised patient to immediately go to UC or [facility] walk-in for face to face exam.  Discussed that she needs imaging and may require possible abx treatment if infection present.  She agrees to this plan of care and was able to verbalize back to provider.  Return for worsening or ongoing symptoms.

## 2019-06-07 NOTE — Progress Notes (Signed)
LMP  (LMP Unknown)    Subjective:    Patient ID: Angela Watts, female    DOB: 14-Jan-1951, 69 y.o.   MRN: [id number]  HPI: Angela Watts is a 69 y.o. female  Chief Complaint  Patient presents with  . Knee Pain    R knee pain since Monday, no known injuries.    . This visit was completed via telephone due to the restrictions of the COVID-19 pandemic. All issues as above were discussed and addressed but no physical exam was performed. If it was felt that the patient should be evaluated in the office, they were directed there. The patient verbally consented to this visit. Patient was unable to complete an audio/visual visit due to Technical difficulties,Lack of internet. Due to the catastrophic nature of the COVID-19 pandemic, this visit was done through audio contact only. . Location of the patient: home . Location of the provider: home . Those involved with this call:  . Provider: Marnee Guarneri, DNP . CMA: Yvonna Alanis, CMA . Front Desk/Registration: Don Perking  . Time spent on call: 15 minutes on the phone discussing health concerns. 10 minutes total spent in review of patient's record and preparation of their chart.  . I verified patient identity using two factors (patient name and date of birth). Patient consents verbally to being seen via telemedicine visit today.    KNEE PAIN (RIGHT) Reports her right knee has been tender all week and is just dragging it today.  Her other knee hurts too, but not as bad.  Right knee hurts worse today.  Reports right knee is swollen and warm to touch with mild redness to left side.  She does have underlying RA followed by rheumatology and takes Methotrexate. Duration: days Involved knee: right Mechanism of injury: unknown Location:medial Onset: gradual Severity: 8/10  Quality:  dull, aching and throbbing Frequency: intermittent Radiation: no Aggravating factors: weight bearing, bending and movement  Alleviating  factors: APAP  Status: worse Treatments attempted: APAP  Relief with NSAIDs?:  No NSAIDs Taken Weakness with weight bearing or walking: yes Sensation of giving way: yes Locking: yes Popping: yes Bruising: no Swelling: yes Redness: yes Paresthesias/decreased sensation: no Fevers: no  Relevant past medical, surgical, family and social history reviewed and updated as indicated. Interim medical history since our last visit reviewed. Allergies and medications reviewed and updated.  Review of Systems  Constitutional: Negative for activity change, appetite change, diaphoresis, fatigue and fever.  Respiratory: Negative for cough, chest tightness and shortness of breath.   Cardiovascular: Negative for chest pain, palpitations and leg swelling.  Gastrointestinal: Negative.   Musculoskeletal: Positive for arthralgias and joint swelling.  Psychiatric/Behavioral: Negative.     Per HPI unless specifically indicated above     Objective:    LMP  (LMP Unknown)   Wt Readings from Last 3 Encounters:  05/04/19 232 lb (105.2 kg)  03/26/19 238 lb 6.4 oz (108.1 kg)  02/07/19 237 lb 9.6 oz (107.8 kg)    Physical Exam   Unable to perform physical exam due to patient technical difficulties.  Results for orders placed or performed in visit on 05/04/19  CBC with Differential/Platelet out  Result Value Ref Range   WBC 4.2 3.4 - 10.8 x10E3/uL   RBC 4.46 3.77 - 5.28 x10E6/uL   Hemoglobin 13.3 11.1 - 15.9 g/dL   Hematocrit 39.5 34.0 - 46.6 %   MCV 89 79 - 97 fL   MCH 29.8 26.6 - 33.0 pg   MCHC  33.7 31.5 - 35.7 g/dL   RDW 14.5 11.7 - 15.4 %   Platelets 338 150 - 450 x10E3/uL   Neutrophils 51 Not Estab. %   Lymphs 34 Not Estab. %   Monocytes 11 Not Estab. %   Eos 3 Not Estab. %   Basos 1 Not Estab. %   Neutrophils Absolute 2.1 1.4 - 7.0 x10E3/uL   Lymphocytes Absolute 1.4 0.7 - 3.1 x10E3/uL   Monocytes Absolute 0.5 0.1 - 0.9 x10E3/uL   EOS (ABSOLUTE) 0.1 0.0 - 0.4 x10E3/uL   Basophils  Absolute 0.0 0.0 - 0.2 x10E3/uL   Immature Granulocytes 0 Not Estab. %   Immature Grans (Abs) 0.0 0.0 - 0.1 x10E3/uL  Comprehensive metabolic panel  Result Value Ref Range   Glucose 97 65 - 99 mg/dL   BUN 13 8 - 27 mg/dL   Creatinine, Ser 0.81 0.57 - 1.00 mg/dL   GFR calc non Af Amer 74 >59 mL/min/1.73   GFR calc Af Amer 85 >59 mL/min/1.73   BUN/Creatinine Ratio 16 12 - 28   Sodium 139 134 - 144 mmol/L   Potassium 3.7 3.5 - 5.2 mmol/L   Chloride 101 96 - 106 mmol/L   CO2 25 20 - 29 mmol/L   Calcium 9.3 8.7 - 10.3 mg/dL   Total Protein 7.4 6.0 - 8.5 g/dL   Albumin 4.2 3.8 - 4.8 g/dL   Globulin, Total 3.2 1.5 - 4.5 g/dL   Albumin/Globulin Ratio 1.3 1.2 - 2.2   Bilirubin Total 0.4 0.0 - 1.2 mg/dL   Alkaline Phosphatase 171 (H) 39 - 117 IU/L   AST 21 0 - 40 IU/L   ALT 17 0 - 32 IU/L  TSH  Result Value Ref Range   TSH 1.730 0.450 - 4.500 uIU/mL  Vit D  25 hydroxy (rtn osteoporosis monitoring)  Result Value Ref Range   Vit D, 25-Hydroxy 33.1 30.0 - 100.0 ng/mL  Lipid Panel w/o Chol/HDL Ratio out  Result Value Ref Range   Cholesterol, Total 161 100 - 199 mg/dL   Triglycerides 55 0 - 149 mg/dL   HDL 57 >39 mg/dL   VLDL Cholesterol Cal 11 5 - 40 mg/dL   LDL Chol Calc (NIH) 93 0 - 99 mg/dL  Folate  Result Value Ref Range   Folate >20.0 >3.0 ng/mL  Iron and TIBC  Result Value Ref Range   Total Iron Binding Capacity 344 250 - 450 ug/dL   UIBC 246 118 - 369 ug/dL   Iron 98 27 - 139 ug/dL   Iron Saturation 28 15 - 55 %      Assessment & Plan:   Problem List Items Addressed This Visit      Other   Right knee pain    Acute with swelling, redness, and warmth reported.  Concern for infection.  Due to technical difficulties, unable to perform physical face to face exam.  Have advised patient to immediately go to UC or [facility] walk-in for face to face exam.  Discussed that she needs imaging and may require possible abx treatment if infection present.  She agrees to this plan of  care and was able to verbalize back to provider.  Return for worsening or ongoing symptoms.         I discussed the assessment and treatment plan with the patient. The patient was provided an opportunity to ask questions and all were answered. The patient agreed with the plan and demonstrated an understanding of the instructions.   The  patient was advised to call back or seek an in-person evaluation if the symptoms worsen or if the condition fails to improve as anticipated.   I provided 15 minutes of time during this encounter.  Follow up plan: Return if symptoms worsen or fail to improve.

## 2019-07-26 ENCOUNTER — Telehealth: Payer: Self-pay | Admitting: Nurse Practitioner

## 2019-07-26 DIAGNOSIS — M1712 Unilateral primary osteoarthritis, left knee: Secondary | ICD-10-CM | POA: Diagnosis not present

## 2019-07-26 NOTE — Telephone Encounter (Signed)
Agree with this plan for walk in, since I am not in office today and unable to perform injection today.

## 2019-07-26 NOTE — Telephone Encounter (Signed)
Routing to close encounter. 

## 2019-07-26 NOTE — Telephone Encounter (Signed)
Called pt and advised pt per last ov note that she can go to [facility] walk [facility]. Pt states that she will go being that she has went once before and they gave her a shot.  Copied from [facility] [id number]. Topic: General - Inquiry >> Jul 26, 2019  8:38 AM Richardo Priest, NT wrote: Reason for CRM: Patient called in stating she is having some swelling now in her left knee for the past week. Patient is wondering if its okay for her to go to orthopedic office that [facility] had suggested before to receive shot. Please advise.

## 2019-08-15 DIAGNOSIS — M1712 Unilateral primary osteoarthritis, left knee: Secondary | ICD-10-CM | POA: Diagnosis not present

## 2019-08-30 ENCOUNTER — Other Ambulatory Visit: Payer: Self-pay | Admitting: Nurse Practitioner

## 2019-09-10 ENCOUNTER — Other Ambulatory Visit: Payer: Self-pay | Admitting: Nurse Practitioner

## 2019-10-17 DIAGNOSIS — M059 Rheumatoid arthritis with rheumatoid factor, unspecified: Secondary | ICD-10-CM | POA: Diagnosis not present

## 2019-10-17 DIAGNOSIS — Z79899 Other long term (current) drug therapy: Secondary | ICD-10-CM | POA: Diagnosis not present

## 2019-10-17 DIAGNOSIS — L409 Psoriasis, unspecified: Secondary | ICD-10-CM | POA: Diagnosis not present

## 2019-10-17 DIAGNOSIS — M170 Bilateral primary osteoarthritis of knee: Secondary | ICD-10-CM | POA: Diagnosis not present

## 2019-10-30 ENCOUNTER — Other Ambulatory Visit: Payer: Self-pay

## 2019-10-30 ENCOUNTER — Ambulatory Visit (INDEPENDENT_AMBULATORY_CARE_PROVIDER_SITE_OTHER): Payer: Medicare Other | Admitting: Nurse Practitioner

## 2019-10-30 ENCOUNTER — Encounter: Payer: Self-pay | Admitting: Nurse Practitioner

## 2019-10-30 VITALS — BP 118/68 | HR 84 | Temp 98.3°F | Wt 228.0 lb

## 2019-10-30 DIAGNOSIS — I10 Essential (primary) hypertension: Secondary | ICD-10-CM

## 2019-10-30 DIAGNOSIS — Z6837 Body mass index (BMI) 37.0-37.9, adult: Secondary | ICD-10-CM

## 2019-10-30 DIAGNOSIS — M0579 Rheumatoid arthritis with rheumatoid factor of multiple sites without organ or systems involvement: Secondary | ICD-10-CM | POA: Diagnosis not present

## 2019-10-30 DIAGNOSIS — E7800 Pure hypercholesterolemia, unspecified: Secondary | ICD-10-CM | POA: Diagnosis not present

## 2019-10-30 NOTE — Assessment & Plan Note (Signed)
Praised for 10 pound loss since November and focus on health.  Recommended eating smaller high protein, low fat meals more frequently and exercising 30 mins a day 5 times a week with a goal of 10-15lb weight loss in the next 3 months. Patient voiced their understanding and motivation to adhere to these recommendations.

## 2019-10-30 NOTE — Assessment & Plan Note (Signed)
Chronic, stable without HCTZ.  BP at goal at home and on repeat in office today.  Will contiue Amlodipine 5 MG daily and if elevations noted in upcoming months will increase to 10 MG.  Remain off HCTZ due to complaint urinary urgency with this.  BMP today.  Focus on DASH diet and regular exercise at home.  Return in 6 months for annual physical.

## 2019-10-30 NOTE — Progress Notes (Signed)
BP 118/68 (BP Location: Left Arm)   Pulse 84   Temp 98.3 F (36.8 C) (Oral)   Wt 228 lb (103.4 kg)   LMP  (LMP Unknown)   SpO2 97%   BMI 37.94 kg/m    Subjective:    Patient ID: Angela Watts, female    DOB: 10/19/1950, 69 y.o.   MRN: [id number]  HPI: Angela Watts is a 69 y.o. female  Chief Complaint  Patient presents with  . Hyperlipidemia  . Hypertension   HYPERTENSION / HYPERLIPIDEMIA Continues on ASA, Amlodipine, + Crestor.  Has taken HCTZ in past which caused increased urination and discomfort.  Has been walking on treadmill 3-4 days a week and focusing on diet with weight loss present.  Has lost 10 pounds since November 2020. Satisfied with current treatment? yes Duration of hypertension: chronic BP monitoring frequency: daily BP range: 130-137/70 range BP medication side effects: no Duration of hyperlipidemia: chronic Cholesterol medication side effects: no Cholesterol supplements: none Medication compliance: good compliance Aspirin: yes Recent stressors: no Recurrent headaches: no Visual changes: no Palpitations: no Dyspnea: no Chest pain: no Lower extremity edema: no Dizzy/lightheaded: no   RHEUMATOID ARTHRITIS: Followed by Dr. Annalee Genta and last seen 10/17/2019.  She is to continue Methotrexate 7 tablets once a week and daily folic acid.  She reports this does help with pain.  She reports having steroid shots by ortho in February and March, but there is discussion of placing gel injections.  Relevant past medical, surgical, family and social history reviewed and updated as indicated. Interim medical history since our last visit reviewed. Allergies and medications reviewed and updated.  Review of Systems  Constitutional: Negative for activity change, appetite change, diaphoresis, fatigue and fever.  Respiratory: Negative for cough, chest tightness, shortness of breath and wheezing.   Cardiovascular: Negative for chest pain, palpitations and  leg swelling.  Gastrointestinal: Negative.   Neurological: Negative.   Psychiatric/Behavioral: Negative.     Per HPI unless specifically indicated above     Objective:    BP 118/68 (BP Location: Left Arm)   Pulse 84   Temp 98.3 F (36.8 C) (Oral)   Wt 228 lb (103.4 kg)   LMP  (LMP Unknown)   SpO2 97%   BMI 37.94 kg/m   Wt Readings from Last 3 Encounters:  10/30/19 228 lb (103.4 kg)  05/04/19 232 lb (105.2 kg)  03/26/19 238 lb 6.4 oz (108.1 kg)    Physical Exam Vitals and nursing note reviewed.  Constitutional:      General: She is awake. She is not in acute distress.    Appearance: She is well-developed and well-groomed. She is obese. She is not ill-appearing.  HENT:     Head: Normocephalic.     Right Ear: Hearing normal. No drainage.     Left Ear: Hearing normal. No drainage.  Eyes:     General: Lids are normal.        Right eye: No discharge.        Left eye: No discharge.     Conjunctiva/sclera: Conjunctivae normal.  Neck:     Thyroid: No thyromegaly.     Vascular: No carotid bruit.  Cardiovascular:     Rate and Rhythm: Normal rate and regular rhythm.     Heart sounds: Normal heart sounds. No murmur heard.  No gallop.   Pulmonary:     Effort: Pulmonary effort is normal. No accessory muscle usage or respiratory distress.     Breath sounds:  Normal breath sounds.  Abdominal:     General: Bowel sounds are normal.     Palpations: Abdomen is soft.  Musculoskeletal:     Cervical back: Normal range of motion.     Right lower leg: No edema.     Left lower leg: No edema.  Neurological:     Mental Status: She is alert and oriented to person, place, and time.  Psychiatric:        Attention and Perception: Attention normal.        Mood and Affect: Mood normal.        Speech: Speech normal.        Behavior: Behavior normal. Behavior is cooperative.        Thought Content: Thought content normal.        Judgment: Judgment normal.     Results for orders placed  or performed in visit on 05/04/19  CBC with Differential/Platelet out  Result Value Ref Range   WBC 4.2 3.4 - 10.8 x10E3/uL   RBC 4.46 3.77 - 5.28 x10E6/uL   Hemoglobin 13.3 11.1 - 15.9 g/dL   Hematocrit 39.5 34.0 - 46.6 %   MCV 89 79 - 97 fL   MCH 29.8 26.6 - 33.0 pg   MCHC 33.7 31 - 35 g/dL   RDW 14.5 11.7 - 15.4 %   Platelets 338 150 - 450 x10E3/uL   Neutrophils 51 Not Estab. %   Lymphs 34 Not Estab. %   Monocytes 11 Not Estab. %   Eos 3 Not Estab. %   Basos 1 Not Estab. %   Neutrophils Absolute 2.1 1 - 7 x10E3/uL   Lymphocytes Absolute 1.4 0 - 3 x10E3/uL   Monocytes Absolute 0.5 0 - 0 x10E3/uL   EOS (ABSOLUTE) 0.1 0.0 - 0.4 x10E3/uL   Basophils Absolute 0.0 0 - 0 x10E3/uL   Immature Granulocytes 0 Not Estab. %   Immature Grans (Abs) 0.0 0.0 - 0.1 x10E3/uL  Comprehensive metabolic panel  Result Value Ref Range   Glucose 97 65 - 99 mg/dL   BUN 13 8 - 27 mg/dL   Creatinine, Ser 0.81 0.57 - 1.00 mg/dL   GFR calc non Af Amer 74 >59 mL/min/1.73   GFR calc Af Amer 85 >59 mL/min/1.73   BUN/Creatinine Ratio 16 12 - 28   Sodium 139 134 - 144 mmol/L   Potassium 3.7 3.5 - 5.2 mmol/L   Chloride 101 96 - 106 mmol/L   CO2 25 20 - 29 mmol/L   Calcium 9.3 8.7 - 10.3 mg/dL   Total Protein 7.4 6.0 - 8.5 g/dL   Albumin 4.2 3.8 - 4.8 g/dL   Globulin, Total 3.2 1.5 - 4.5 g/dL   Albumin/Globulin Ratio 1.3 1.2 - 2.2   Bilirubin Total 0.4 0.0 - 1.2 mg/dL   Alkaline Phosphatase 171 (H) 39 - 117 IU/L   AST 21 0 - 40 IU/L   ALT 17 0 - 32 IU/L  TSH  Result Value Ref Range   TSH 1.730 0.450 - 4.500 uIU/mL  Vit D  25 hydroxy (rtn osteoporosis monitoring)  Result Value Ref Range   Vit D, 25-Hydroxy 33.1 30.0 - 100.0 ng/mL  Lipid Panel w/o Chol/HDL Ratio out  Result Value Ref Range   Cholesterol, Total 161 100 - 199 mg/dL   Triglycerides 55 0 - 149 mg/dL   HDL 57 >39 mg/dL   VLDL Cholesterol Cal 11 5 - 40 mg/dL   LDL Chol Calc (NIH) 93 0 - 99  mg/dL  Folate  Result Value Ref Range    Folate >20.0 >3.0 ng/mL  Iron and TIBC  Result Value Ref Range   Total Iron Binding Capacity 344 250 - 450 ug/dL   UIBC 246 118 - 369 ug/dL   Iron 98 27 - 139 ug/dL   Iron Saturation 28 15 - 55 %      Assessment & Plan:   Problem List Items Addressed This Visit      Cardiovascular and Mediastinum   Hypertension    Chronic, stable without HCTZ.  BP at goal at home and on repeat in office today.  Will contiue Amlodipine 5 MG daily and if elevations noted in upcoming months will increase to 10 MG.  Remain off HCTZ due to complaint urinary urgency with this.  BMP today.  Focus on DASH diet and regular exercise at home.  Return in 6 months for annual physical.      Relevant Orders   Basic metabolic panel     Musculoskeletal and Integument   RA (rheumatoid arthritis) ([place]) - Primary    Chronic, ongoing.  Continue collaboration with rheumatology + current medication regimen.  Check iron, folic acid, and CBC at annual physical.        Other   Hyperlipidemia    Chronic, ongoing.  Continue current medication regimen and adjust as needed.  Lipid panel today.      Relevant Orders   Lipid Panel w/o Chol/HDL Ratio   Obesity    Praised for 10 pound loss since November and focus on health.  Recommended eating smaller high protein, low fat meals more frequently and exercising 30 mins a day 5 times a week with a goal of 10-15lb weight loss in the next 3 months. Patient voiced their understanding and motivation to adhere to these recommendations.           Follow up plan: Return in about 6 months (around 04/30/2020) for Annual physical -- last was 05/04/2019.

## 2019-10-30 NOTE — Assessment & Plan Note (Signed)
Chronic, ongoing.  Continue collaboration with rheumatology + current medication regimen.  Check iron, folic acid, and CBC at annual physical.

## 2019-10-30 NOTE — Patient Instructions (Signed)
DASH Eating Plan DASH stands for "Dietary Approaches to Stop Hypertension." The DASH eating plan is a healthy eating plan that has been shown to reduce high blood pressure (hypertension). It may also reduce your risk for type 2 diabetes, heart disease, and stroke. The DASH eating plan may also help with weight loss. What are tips for following this plan?  General guidelines  Avoid eating more than 2,300 mg (milligrams) of salt (sodium) a day. If you have hypertension, you may need to reduce your sodium intake to 1,500 mg a day.  Limit alcohol intake to no more than 1 drink a day for nonpregnant women and 2 drinks a day for men. One drink equals 12 oz of beer, 5 oz of wine, or 1 oz of hard liquor.  Work with your health care provider to maintain a healthy body weight or to lose weight. Ask what an ideal weight is for you.  Get at least 30 minutes of exercise that causes your heart to beat faster (aerobic exercise) most days of the week. Activities may include walking, swimming, or biking.  Work with your health care provider or diet and nutrition specialist (dietitian) to adjust your eating plan to your individual calorie needs. Reading food labels   Check food labels for the amount of sodium per serving. Choose foods with less than 5 percent of the Daily Value of sodium. Generally, foods with less than 300 mg of sodium per serving fit into this eating plan.  To find whole grains, look for the word "whole" as the first word in the ingredient list. Shopping  Buy products labeled as "low-sodium" or "no salt added."  Buy fresh foods. Avoid canned foods and premade or frozen meals. Cooking  Avoid adding salt when cooking. Use salt-free seasonings or herbs instead of table salt or sea salt. Check with your health care provider or pharmacist before using salt substitutes.  Do not fry foods. Cook foods using healthy methods such as baking, boiling, grilling, and broiling instead.  Cook with  heart-healthy oils, such as olive, canola, soybean, or sunflower oil. Meal planning  Eat a balanced diet that includes: ? 5 or more servings of fruits and vegetables each day. At each meal, try to fill half of your plate with fruits and vegetables. ? Up to 6-8 servings of whole grains each day. ? Less than 6 oz of lean meat, poultry, or fish each day. A 3-oz serving of meat is about the same size as a deck of cards. One egg equals 1 oz. ? 2 servings of low-fat dairy each day. ? A serving of nuts, seeds, or beans 5 times each week. ? Heart-healthy fats. Healthy fats called Omega-3 fatty acids are found in foods such as flaxseeds and coldwater fish, like sardines, salmon, and mackerel.  Limit how much you eat of the following: ? Canned or prepackaged foods. ? Food that is high in trans fat, such as fried foods. ? Food that is high in saturated fat, such as fatty meat. ? Sweets, desserts, sugary drinks, and other foods with added sugar. ? Full-fat dairy products.  Do not salt foods before eating.  Try to eat at least 2 vegetarian meals each week.  Eat more home-cooked food and less restaurant, buffet, and fast food.  When eating at a restaurant, ask that your food be prepared with less salt or no salt, if possible. What foods are recommended? The items listed may not be a complete list. Talk with your dietitian about   what dietary choices are best for you. Grains Whole-grain or whole-wheat bread. Whole-grain or whole-wheat pasta. Brown rice. Oatmeal. Quinoa. Bulgur. Whole-grain and low-sodium cereals. Pita bread. Low-fat, low-sodium crackers. Whole-wheat flour tortillas. Vegetables Fresh or frozen vegetables (raw, steamed, roasted, or grilled). Low-sodium or reduced-sodium tomato and vegetable juice. Low-sodium or reduced-sodium tomato sauce and tomato paste. Low-sodium or reduced-sodium canned vegetables. Fruits All fresh, dried, or frozen fruit. Canned fruit in natural juice (without  added sugar). Meat and other protein foods Skinless chicken or turkey. Ground chicken or turkey. Pork with fat trimmed off. Fish and seafood. Egg whites. Dried beans, peas, or lentils. Unsalted nuts, nut butters, and seeds. Unsalted canned beans. Lean cuts of beef with fat trimmed off. Low-sodium, lean deli meat. Dairy Low-fat (1%) or fat-free (skim) milk. Fat-free, low-fat, or reduced-fat cheeses. Nonfat, low-sodium ricotta or cottage cheese. Low-fat or nonfat yogurt. Low-fat, low-sodium cheese. Fats and oils Soft margarine without trans fats. Vegetable oil. Low-fat, reduced-fat, or light mayonnaise and salad dressings (reduced-sodium). Canola, safflower, olive, soybean, and sunflower oils. Avocado. Seasoning and other foods Herbs. Spices. Seasoning mixes without salt. Unsalted popcorn and pretzels. Fat-free sweets. What foods are not recommended? The items listed may not be a complete list. Talk with your dietitian about what dietary choices are best for you. Grains Baked goods made with fat, such as croissants, muffins, or some breads. Dry pasta or rice meal packs. Vegetables Creamed or fried vegetables. Vegetables in a cheese sauce. Regular canned vegetables (not low-sodium or reduced-sodium). Regular canned tomato sauce and paste (not low-sodium or reduced-sodium). Regular tomato and vegetable juice (not low-sodium or reduced-sodium). Pickles. Olives. Fruits Canned fruit in a light or heavy syrup. Fried fruit. Fruit in cream or butter sauce. Meat and other protein foods Fatty cuts of meat. Ribs. Fried meat. Bacon. Sausage. Bologna and other processed lunch meats. Salami. Fatback. Hotdogs. Bratwurst. Salted nuts and seeds. Canned beans with added salt. Canned or smoked fish. Whole eggs or egg yolks. Chicken or turkey with skin. Dairy Whole or 2% milk, cream, and half-and-half. Whole or full-fat cream cheese. Whole-fat or sweetened yogurt. Full-fat cheese. Nondairy creamers. Whipped toppings.  Processed cheese and cheese spreads. Fats and oils Butter. Stick margarine. Lard. Shortening. Ghee. Bacon fat. Tropical oils, such as coconut, palm kernel, or palm oil. Seasoning and other foods Salted popcorn and pretzels. Onion salt, garlic salt, seasoned salt, table salt, and sea salt. Worcestershire sauce. Tartar sauce. Barbecue sauce. Teriyaki sauce. Soy sauce, including reduced-sodium. Steak sauce. Canned and packaged gravies. Fish sauce. Oyster sauce. Cocktail sauce. Horseradish that you find on the shelf. Ketchup. Mustard. Meat flavorings and tenderizers. Bouillon cubes. Hot sauce and Tabasco sauce. Premade or packaged marinades. Premade or packaged taco seasonings. Relishes. Regular salad dressings. Where to find more information:  National Heart, Lung, and Blood Institute: www.nhlbi.nih.gov  American Heart Association: www.heart.org Summary  The DASH eating plan is a healthy eating plan that has been shown to reduce high blood pressure (hypertension). It may also reduce your risk for type 2 diabetes, heart disease, and stroke.  With the DASH eating plan, you should limit salt (sodium) intake to 2,300 mg a day. If you have hypertension, you may need to reduce your sodium intake to 1,500 mg a day.  When on the DASH eating plan, aim to eat more fresh fruits and vegetables, whole grains, lean proteins, low-fat dairy, and heart-healthy fats.  Work with your health care provider or diet and nutrition specialist (dietitian) to adjust your eating plan to your   individual calorie needs. This information is not intended to replace advice given to you by your health care provider. Make sure you discuss any questions you have with your health care provider. Document Revised: 04/15/2017 Document Reviewed: 04/26/2016 Elsevier Patient Education  2020 Elsevier Inc.  

## 2019-10-30 NOTE — Assessment & Plan Note (Signed)
Chronic, ongoing.  Continue current medication regimen and adjust as needed. Lipid panel today. 

## 2019-10-31 LAB — BASIC METABOLIC PANEL
BUN/Creatinine Ratio: 18 (ref 12–28)
BUN: 15 mg/dL (ref 8–27)
CO2: 24 mmol/L (ref 20–29)
Calcium: 9.3 mg/dL (ref 8.7–10.3)
Chloride: 108 mmol/L — ABNORMAL HIGH (ref 96–106)
Creatinine, Ser: 0.85 mg/dL (ref 0.57–1.00)
GFR calc Af Amer: 80 mL/min/{1.73_m2} (ref >59)
GFR calc non Af Amer: 69 mL/min/{1.73_m2} (ref >59)
Glucose: 84 mg/dL (ref 65–99)
Potassium: 4.7 mmol/L (ref 3.5–5.2)
Sodium: 144 mmol/L (ref 134–144)

## 2019-10-31 LAB — LIPID PANEL W/O CHOL/HDL RATIO
Cholesterol, Total: 165 mg/dL (ref 100–199)
HDL: 63 mg/dL (ref >39)
LDL Chol Calc (NIH): 92 mg/dL (ref 0–99)
Triglycerides: 51 mg/dL (ref 0–149)
VLDL Cholesterol Cal: 10 mg/dL (ref 5–40)

## 2019-10-31 NOTE — Progress Notes (Signed)
Good morning, please let Angela Watts know her kidney function and electrolytes remain stable ranges.  Cholesterol levels look good, we may consider increasing Crestor in future to get LDL < 70, currently 92.  This would improve stroke prevention, will discuss more next visit.  Have a great day!!

## 2019-11-28 ENCOUNTER — Other Ambulatory Visit: Payer: Self-pay | Admitting: Nurse Practitioner

## 2019-12-12 ENCOUNTER — Other Ambulatory Visit: Payer: Self-pay

## 2019-12-12 DIAGNOSIS — D242 Benign neoplasm of left breast: Secondary | ICD-10-CM

## 2020-01-01 ENCOUNTER — Other Ambulatory Visit: Payer: Self-pay | Admitting: Nurse Practitioner

## 2020-01-29 ENCOUNTER — Ambulatory Visit
Admission: RE | Admit: 2020-01-29 | Discharge: 2020-01-29 | Disposition: A | Discharge status: Home / Self Care | Payer: Medicare Other | Source: Ambulatory Visit | Attending: Surgery | Admitting: Surgery

## 2020-01-29 ENCOUNTER — Other Ambulatory Visit: Payer: Self-pay

## 2020-01-29 DIAGNOSIS — D242 Benign neoplasm of left breast: Secondary | ICD-10-CM

## 2020-01-29 DIAGNOSIS — R922 Inconclusive mammogram: Secondary | ICD-10-CM | POA: Diagnosis not present

## 2020-02-04 ENCOUNTER — Ambulatory Visit (INDEPENDENT_AMBULATORY_CARE_PROVIDER_SITE_OTHER): Payer: Medicare Other | Admitting: Surgery

## 2020-02-04 ENCOUNTER — Encounter: Payer: Self-pay | Admitting: Surgery

## 2020-02-04 ENCOUNTER — Other Ambulatory Visit: Payer: Self-pay

## 2020-02-04 VITALS — BP 137/79 | HR 74 | Temp 98.4°F | Ht 65.0 in | Wt 228.8 lb

## 2020-02-04 DIAGNOSIS — D242 Benign neoplasm of left breast: Secondary | ICD-10-CM | POA: Diagnosis not present

## 2020-02-04 NOTE — Patient Instructions (Addendum)
Dr.Piscoya recommends patient to have a repeat Mammogram in one year and follow up with our office. Patient will received a call from our office closer to the time of the appointment next year to get patient scheduled. If you have any questions before next year, advised to give our office a call.  Breast Self-Awareness Breast self-awareness is knowing how your breasts look and feel. Doing breast self-awareness is important. It allows you to catch a breast problem early while it is still small and can be treated. All women should do breast self-awareness, including women who have had breast implants. Tell your doctor if you notice a change in your breasts. What you need:  A mirror.  A well-lit room. How to do a breast self-exam A breast self-exam is one way to learn what is normal for your breasts and to check for changes. To do a breast self-exam: Look for changes  1. Take off all the clothes above your waist. 2. Stand in front of a mirror in a room with good lighting. 3. Put your hands on your hips. 4. Push your hands down. 5. Look at your breasts and nipples in the mirror to see if one breast or nipple looks different from the other. Check to see if: ? The shape of one breast is different. ? The size of one breast is different. ? There are wrinkles, dips, and bumps in one breast and not the other. 6. Look at each breast for changes in the skin, such as: ? Redness. ? Scaly areas. 7. Look for changes in your nipples, such as: ? Liquid around the nipples. ? Bleeding. ? Dimpling. ? Redness. ? A change in where the nipples are. Feel for changes  1. Lie on your back on the floor. 2. Feel each breast. To do this, follow these steps: ? Pick a breast to feel. ? Put the arm closest to that breast above your head. ? Use your other arm to feel the nipple area of your breast. Feel the area with the pads of your three middle fingers by making small circles with your fingers. For the first  circle, press lightly. For the second circle, press harder. For the third circle, press even harder. ? Keep making circles with your fingers at the different pressures as you move down your breast. Stop when you feel your ribs. ? Move your fingers a little toward the center of your body. ? Start making circles with your fingers again, this time going up until you reach your collarbone. ? Keep making up-and-down circles until you reach your armpit. Remember to keep using the three pressures. ? Feel the other breast in the same way. 3. Sit or stand in the tub or shower. 4. With soapy water on your skin, feel each breast the same way you did in step 2 when you were lying on the floor. Write down what you find Writing down what you find can help you remember what to tell your doctor. Write down:  What is normal for each breast.  Any changes you find in each breast, including: ? The kind of changes you find. ? Whether you have pain. ? Size and location of any lumps.  When you last had your menstrual period. General tips  Check your breasts every month.  If you are breastfeeding, the best time to check your breasts is after you feed your baby or after you use a breast pump.  If you get menstrual periods, the best time  to check your breasts is 5-7 days after your menstrual period is over.  With time, you will become comfortable with the self-exam, and you will begin to know if there are changes in your breasts. Contact a doctor if you:  See a change in the shape or size of your breasts or nipples.  See a change in the skin of your breast or nipples, such as red or scaly skin.  Have fluid coming from your nipples that is not normal.  Find a lump or thick area that was not there before.  Have pain in your breasts.  Have any concerns about your breast health. Summary  Breast self-awareness includes looking for changes in your breasts, as well as feeling for changes within your  breasts.  Breast self-awareness should be done in front of a mirror in a well-lit room.  You should check your breasts every month. If you get menstrual periods, the best time to check your breasts is 5-7 days after your menstrual period is over.  Let your doctor know of any changes you see in your breasts, including changes in size, changes on the skin, pain or tenderness, or fluid from your nipples that is not normal. This information is not intended to replace advice given to you by your health care provider. Make sure you discuss any questions you have with your health care provider. Document Revised: 12/20/2017 Document Reviewed: 12/20/2017 Elsevier Patient Education  [place].

## 2020-02-04 NOTE — Progress Notes (Signed)
02/04/2020  History of Present Illness: Angela Watts is a 69 y.o. female presenting for follow up of left breast intraductal papillomas.  She's had a total of three biopsies in the left breast in 2011, 2018, and 2020.  All have showed intraductal papilloma without any atypia.  She had been followed initially by Dr. Bary Castilla and their discussion and plan was to continue watchful waiting.  Today, the patient reports doing well.  Denies any palpable masses, changes to the skin, or nipple drainage or breast pain.  She had a mammogram on 01/29/20 which was overall stable without any new changes or suspicious findings.   Past Medical History: Past Medical History:  Diagnosis Date  . Abnormal Pap smear of cervix   . Abscess of bursa, left hip   . Arthritis   . Colon adenomas   . GERD (gastroesophageal reflux disease)   . Hyperlipidemia   . Hypertension   . Psoriasis   . Rheumatoid arthritis flare ([facility])      Past Surgical History: Past Surgical History:  Procedure Laterality Date  . ABDOMINAL HYSTERECTOMY  2003  . BREAST BIOPSY Left 06/01/2016   [place] bx done by dr byrnett 6:00 2cmfn intraducal papilloma with calcs  . BREAST BIOPSY Left 02/05/2019   Affirm bx-"X" clip-benign parenchyma with fragments of sclerosed intraductal papilloma  . BREAST BIOPSY Left 2012   [place] bx done by Dr. Bary Castilla, intraductal papilloma  . BREAST CYST ASPIRATION Left 2011   NEG/ Br Byrnett  . BREAST CYST ASPIRATION Left 06/01/2016   dr byrnett 5:00 3cmfn aspirated with complete resolution  . COLONOSCOPY WITH PROPOFOL N/A 06/11/2015   Procedure: COLONOSCOPY WITH PROPOFOL;  Surgeon: Manya Silvas, MD;  Location[facility] ENDOSCOPY;  Service: Endoscopy;  Laterality: N/A;  . COLONOSCOPY WITH PROPOFOL N/A 07/14/2018   Procedure: COLONOSCOPY WITH PROPOFOL;  Surgeon: Manya Silvas, MD;  Location[facility] ENDOSCOPY;  Service: Endoscopy;  Laterality: N/A;  . CYST EXCISION Left    breast    Home Medications: Prior to  Admission medications   Medication Sig Start Date End Date Taking? Authorizing Provider  amLODipine (NORVASC) 5 MG tablet Take 1 tablet by mouth once daily 11/28/19  Yes [name], Jolene T, NP  aspirin EC 81 MG tablet Take 81 mg by mouth daily.   Yes [provider]  folic acid (FOLVITE) 1 MG tablet Take 1 tablet by mouth daily. 08/03/17  Yes [provider]  hydrochlorothiazide (HYDRODIURIL) 12.5 MG tablet hydrochlorothiazide 12.5 mg tablet   Yes [provider]  methotrexate (RHEUMATREX) 2.5 MG tablet Take 7 tabs (17.5 MG ) once a week for 12 weeks 02/02/18  Yes [provider]  Multiple Vitamin (MULTIVITAMIN) capsule Take 1 capsule by mouth daily.   Yes [provider]  rosuvastatin (CRESTOR) 20 MG tablet TAKE 1 TABLET BY MOUTH ON MONDAY, WEDNESDAY, AND FRIDAY AT 6PM 01/01/20  Yes [name], Jolene T, NP    Allergies: Allergies  Allergen Reactions  . Prednisone Itching    Review of Systems: Review of Systems  Constitutional: Negative for chills and fever.  Respiratory: Negative for shortness of breath.   Cardiovascular: Negative for chest pain.  Gastrointestinal: Negative for nausea and vomiting.  Skin: Negative for rash.    Physical Exam BP 137/79   Pulse 74   Temp 98.4 F (36.9 C) (Oral)   Ht 5\' 5"  (1.651 m)   Wt 228 lb 12.8 oz (103.8 kg)   LMP  (LMP Unknown)   SpO2 98%  BMI 38.07 kg/m  CONSTITUTIONAL: No acute distress HEENT:  Normocephalic, atraumatic, extraocular motion intact. RESPIRATORY: Normal respiratory effort without pathologic use of accessory muscles. CARDIOVASCULAR:  Regular rhythm and rate BREAST:  Left breast without any palpable masses, skin changes, or nipple changes/drainage.  No left axillary lymphadenopathy.  Right breast without any palpable masses, skin changes, or nipple changes/drainage.  No right axillary lymphadenopathy. NEUROLOGIC:  Motor and sensation is grossly normal.  Cranial nerves are grossly  intact. PSYCH:  Alert and oriented to person, place and time. Affect is normal.  Labs/Imaging: Mammogram 01/29/20: FINDINGS: Post biopsy marking clips are seen in association with stable masses in the central left breast. No new or suspicious findings are identified in either breast. The parenchymal pattern is stable.  Mammographic images were processed with CAD.  IMPRESSION: 1. No mammographic evidence of malignancy in either breast. 2. Stable post biopsy changes in association with isodense masses in the central left breast.  RECOMMENDATION: Annual bilateral mammography is recommended in 1 year.  Assessment and Plan: This is a 69 y.o. female with intraductal papillomas of left breast.  --Patient's imaging is stable without any new findings.  Her exam is reassuring today as well.  Discussed with her that if she wanted, we could discuss excision vs continuing watchful waiting for now as previously planned and the patient prefers conservative management. --Follow up in one year with bilateral mammogram.  Face-to-face time spent with the patient and care providers was 15 minutes, with more than 50% of the time spent counseling, educating, and coordinating care of the patient.     Melvyn Neth, [facility]

## 2020-02-12 DIAGNOSIS — Z23 Encounter for immunization: Secondary | ICD-10-CM | POA: Diagnosis not present

## 2020-02-14 DIAGNOSIS — Z79899 Other long term (current) drug therapy: Secondary | ICD-10-CM | POA: Diagnosis not present

## 2020-02-14 DIAGNOSIS — L409 Psoriasis, unspecified: Secondary | ICD-10-CM | POA: Diagnosis not present

## 2020-02-14 DIAGNOSIS — M059 Rheumatoid arthritis with rheumatoid factor, unspecified: Secondary | ICD-10-CM | POA: Diagnosis not present

## 2020-02-14 DIAGNOSIS — M170 Bilateral primary osteoarthritis of knee: Secondary | ICD-10-CM | POA: Diagnosis not present

## 2020-02-27 DIAGNOSIS — Z20822 Contact with and (suspected) exposure to covid-19: Secondary | ICD-10-CM | POA: Diagnosis not present

## 2020-03-27 ENCOUNTER — Ambulatory Visit: Payer: Medicare Other

## 2020-03-28 ENCOUNTER — Ambulatory Visit (INDEPENDENT_AMBULATORY_CARE_PROVIDER_SITE_OTHER): Payer: Medicare Other

## 2020-03-28 VITALS — Ht 65.0 in | Wt 222.0 lb

## 2020-03-28 DIAGNOSIS — Z0000 Encounter for general adult medical examination without abnormal findings: Secondary | ICD-10-CM

## 2020-03-28 NOTE — Progress Notes (Signed)
I connected with Varney Biles today by telephone and verified that I am speaking with the correct person using two identifiers. Location patient: home Location provider: work Persons participating in the virtual visit: Shikha Bibb, Glenna Durand LPN.   I discussed the limitations, risks, security and privacy concerns of performing an evaluation and management service by telephone and the availability of in person appointments. I also discussed with the patient that there may be a patient responsible charge related to this service. The patient expressed understanding and verbally consented to this telephonic visit.    Interactive audio and video telecommunications were attempted between this provider and patient, however failed, due to patient having technical difficulties OR patient did not have access to video capability.  We continued and completed visit with audio only.     Vital signs may be patient reported or missing.  Subjective:   Angela Watts is a 69 y.o. female who presents for Medicare Annual (Subsequent) preventive examination.  Review of Systems     Cardiac Risk Factors include: advanced age (>38men, >77 women);dyslipidemia;hypertension;obesity (BMI >30kg/m2)     Objective:    Today's Vitals   03/28/20 1111  Weight: 222 lb (100.7 kg)  Height: 5\' 5"  (1.651 m)   Body mass index is 36.94 kg/m.  Advanced Directives 03/28/2020 03/26/2019 07/14/2018 03/23/2018 03/21/2017 09/18/2016 07/27/2016  Does Patient Have a Medical Advance Directive? Yes Yes Yes No Yes No No  Type of Advance Directive Healthcare Power of Attorney Living will;Healthcare [place];Living will - -  Copy of [place] in Chart? No - copy requested No - copy requested - - No - copy requested - -  Would patient like information on creating a medical advance directive? - - - Yes (MAU/Ambulatory/Procedural Areas - Information given) - - No -  Patient declined    Current Medications (verified) Outpatient Encounter Medications as of 03/28/2020  Medication Sig  . amLODipine (NORVASC) 5 MG tablet Take 1 tablet by mouth once daily  . aspirin EC 81 MG tablet Take 81 mg by mouth daily.  . folic acid (FOLVITE) 1 MG tablet Take 1 tablet by mouth daily.  . methotrexate (RHEUMATREX) 2.5 MG tablet Take 7 tabs (17.5 MG ) once a week for 12 weeks  . Multiple Vitamin (MULTIVITAMIN) capsule Take 1 capsule by mouth daily.  . rosuvastatin (CRESTOR) 20 MG tablet TAKE 1 TABLET BY MOUTH ON MONDAY, WEDNESDAY, AND FRIDAY AT 6PM  . hydrochlorothiazide (HYDRODIURIL) 12.5 MG tablet hydrochlorothiazide 12.5 mg tablet (Patient not taking: Reported on 03/28/2020)   No facility-administered encounter medications on file as of 03/28/2020.    Allergies (verified) Prednisone   History: Past Medical History:  Diagnosis Date  . Abnormal Pap smear of cervix   . Abscess of bursa, left hip   . Arthritis   . Colon adenomas   . GERD (gastroesophageal reflux disease)   . Hyperlipidemia   . Hypertension   . Psoriasis   . Rheumatoid arthritis flare (HCC)    Past Surgical History:  Procedure Laterality Date  . ABDOMINAL HYSTERECTOMY  2003  . BREAST BIOPSY Left 06/01/2016   [place] bx done by dr byrnett 6:00 2cmfn intraducal papilloma with calcs  . BREAST BIOPSY Left 02/05/2019   Affirm bx-"X" clip-benign parenchyma with fragments of sclerosed intraductal papilloma  . BREAST BIOPSY Left 2012   [place] bx done by Dr. Bary Castilla, intraductal papilloma  . BREAST CYST ASPIRATION Left 2011   NEG/ Br  Byrnett  . BREAST CYST ASPIRATION Left 06/01/2016   dr byrnett 5:00 3cmfn aspirated with complete resolution  . COLONOSCOPY WITH PROPOFOL N/A 06/11/2015   Procedure: COLONOSCOPY WITH PROPOFOL;  Surgeon: Manya Silvas, MD;  Location: [facility] ENDOSCOPY;  Service: Endoscopy;  Laterality: N/A;  . COLONOSCOPY WITH PROPOFOL N/A 07/14/2018   Procedure: COLONOSCOPY WITH PROPOFOL;   Surgeon: Manya Silvas, MD;  Location: [facility] ENDOSCOPY;  Service: Endoscopy;  Laterality: N/A;  . CYST EXCISION Left    breast   Family History  Problem Relation Age of Onset  . Hypertension Mother   . Arthritis Mother   . Breast cancer Mother   . Cancer Mother        breast  . Hypertension Father   . Lung cancer Father   . Diabetes Sister   . Hypertension Sister   . Hypertension Brother   . Hypertension Sister   . Hypertension Sister   . Hypertension Sister   . Hypertension Brother   . Cancer Brother        lung   Social History   Socioeconomic History  . Marital status: Single    Spouse name: Not on file  . Number of children: Not on file  . Years of education: Not on file  . Highest education level: Not on file  Occupational History  . Not on file  Tobacco Use  . Smoking status: Never Smoker  . Smokeless tobacco: Never Used  Vaping Use  . Vaping Use: Never used  Substance and Sexual Activity  . Alcohol use: Yes    Alcohol/week: 0.0 standard drinks    Comment: on occasion/socially  . Drug use: No  . Sexual activity: Never  Other Topics Concern  . Not on file  Social History Narrative  . Not on file   Social Determinants of Health   Financial Resource Strain: Low Risk   . Difficulty of Paying Living Expenses: Not hard at all  Food Insecurity: No Food Insecurity  . Worried About Charity fundraiser in the Last Year: Never true  . Ran Out of Food in the Last Year: Never true  Transportation Needs: No Transportation Needs  . Lack of Transportation (Medical): No  . Lack of Transportation (Non-Medical): No  Physical Activity: Insufficiently Active  . Days of Exercise per Week: 4 days  . Minutes of Exercise per Session: 30 min  Stress: No Stress Concern Present  . Feeling of Stress : Not at all  Social Connections:   . Frequency of Communication with Friends and Family: Not on file  . Frequency of Social Gatherings with Friends and Family: Not on file    . Attends Religious Services: Not on file  . Active Member of Clubs or Organizations: Not on file  . Attends Archivist Meetings: Not on file  . Marital Status: Not on file    Tobacco Counseling Counseling given: Not Answered   Clinical Intake:  Pre-visit preparation completed: Yes  Pain : No/denies pain     Nutritional Status: BMI > 30  Obese Nutritional Risks: None Diabetes: No  How often do you need to have someone help you when you read instructions, pamphlets, or other written materials from your doctor or pharmacy?: 1 - Never What is the last grade level you completed in school?: 12th grade  Diabetic? no  Interpreter Needed?: No  Information entered by :: NAllen LPN   Activities of Daily Living In your present state of health, do you have  any difficulty performing the following activities: 03/28/2020 05/04/2019  Hearing? N N  Vision? Y N  Comment a little blurry at times -  Difficulty concentrating or making decisions? Y N  Walking or climbing stairs? N N  Dressing or bathing? N N  Doing errands, shopping? N N  Preparing Food and eating ? N -  Using the Toilet? N -  In the past six months, have you accidently leaked urine? Y -  Comment if held too long -  Do you have problems with loss of bowel control? N -  Managing your Medications? N -  Managing your Finances? N -  Housekeeping or managing your Housekeeping? N -  Some recent data might be hidden    Patient Care Team: Venita Lick, NP as PCP - General (Nurse Practitioner) Valerie Roys, DO as Referring Physician (Family Medicine) Bary Castilla Forest Gleason, MD (General Surgery) Marlowe Sax, MD as Referring Physician (Internal Medicine)  Indicate any recent Medical Services you may have received from other than [facility] providers in the past year (date may be approximate).     Assessment:   This is a routine wellness examination for [place].  Hearing/Vision screen  Hearing  Screening   125Hz  250Hz  500Hz  1000Hz  2000Hz  3000Hz  4000Hz  6000Hz  8000Hz   Right ear:           Left ear:           Vision Screening Comments: Regular eye exams, [facility]  Dietary issues and exercise activities discussed: Current Exercise Habits: Home exercise routine, Type of exercise: treadmill, Time (Minutes): 30, Frequency (Times/Week): 4, Weekly Exercise (Minutes/Week): 120  Goals    . Patient Stated     03/28/2020, wants to eat healthy      Depression Screen PHQ 2/9 Scores 03/28/2020 05/04/2019 03/26/2019 03/23/2018 03/21/2017 03/19/2016 03/18/2015  PHQ - 2 Score 0 0 0 1 1 0 0  PHQ- 9 Score - - - - 3 0 -    Fall Risk Fall Risk  03/28/2020 02/04/2020 05/04/2019 03/26/2019 02/07/2019  Falls in the past year? 0 0 0 0 0  Comment - - - - -  Number falls in past yr: - 0 0 0 -  Injury with Fall? - 0 0 0 -  Risk for fall due to : Medication side effect - - - -  Follow up Falls evaluation completed;Education provided;Falls prevention discussed - Falls evaluation completed - -    Any stairs in or around the home? Yes  If so, are there any without handrails? No  Home free of loose throw rugs in walkways, pet beds, electrical cords, etc? Yes  Adequate lighting in your home to reduce risk of falls? Yes   ASSISTIVE DEVICES UTILIZED TO PREVENT FALLS:  Life alert? No  Use of a cane, walker or w/c? No  Grab bars in the bathroom? No  Shower chair or bench in shower? No  Elevated toilet seat or a handicapped toilet? No   TIMED UP AND GO:  Was the test performed? No .      Cognitive Function:     6CIT Screen 03/28/2020 03/26/2019 03/23/2018 03/21/2017  What Year? 0 points 0 points 0 points 0 points  What month? 0 points 0 points 0 points 0 points  What time? 0 points 0 points 0 points 0 points  Count back from 20 2 points 0 points 0 points 0 points  Months in reverse 0 points 0 points 0 points 0 points  Repeat phrase 0  points 0 points 4 points 0 points  Total Score 2 0 4 0     Immunizations Immunization History  Administered Date(s) Administered  . Influenza, High Dose Seasonal PF 03/19/2016, 03/21/2017, 03/23/2018, 02/15/2019  . Influenza,inj,Quad PF,6+ Mos 03/18/2015  . PFIZER SARS-COV-2 Vaccination 07/23/2019, 08/13/2019  . Pneumococcal Conjugate-13 02/11/2014  . Pneumococcal Polysaccharide-23 02/03/2010, 03/18/2015  . Td 03/19/2004  . Tdap 03/18/2015  . Zoster 02/20/2013    TDAP status: Up to date Flu Vaccine status: Up to date Pneumococcal vaccine status: Up to date Covid-19 vaccine status: Completed vaccines  Qualifies for Shingles Vaccine? Yes   Zostavax completed Yes   Shingrix Completed?: No.    Education has been provided regarding the importance of this vaccine. Patient has been advised to call insurance company to determine out of pocket expense if they have not yet received this vaccine. Advised may also receive vaccine at local pharmacy or Health Dept. Verbalized acceptance and understanding.  Screening Tests Health Maintenance  Topic Date Due  . MAMMOGRAM  01/28/2022  . TETANUS/TDAP  03/17/2025  . COLONOSCOPY  07/14/2028  . INFLUENZA VACCINE  Completed  . DEXA SCAN  Completed  . COVID-19 Vaccine  Completed  . Hepatitis C Screening  Completed  . PNA vac Low Risk Adult  Completed    Health Maintenance  There are no preventive care reminders to display for this patient.  Colorectal cancer screening: Completed 07/14/2018. Repeat every 10 years Mammogram status: Completed 01/29/2020. Repeat every year Bone Density status: Completed 05/05/2016. Results reflect: Bone density results: NORMAL. Repeat every 0 years.  Lung Cancer Screening: (Low Dose CT Chest recommended if Age 4-80 years, 30 pack-year currently smoking OR have quit w/in 15years.) does not qualify.   Lung Cancer Screening Referral: no  Additional Screening:  Hepatitis C Screening: does qualify; Completed 03/18/2015   Vision Screening: Recommended annual  ophthalmology exams for early detection of glaucoma and other disorders of the eye. Is the patient up to date with their annual eye exam?  Yes  Who is the provider or what is the name of the office in which the patient attends annual eye exams?  [facility] If pt is not established with a provider, would they like to be referred to a provider to establish care? No .   Dental Screening: Recommended annual dental exams for proper oral hygiene  Community Resource Referral / Chronic Care Management: CRR required this visit?  No   CCM required this visit?  No      Plan:     I have personally reviewed and noted the following in the patient's chart:   . Medical and social history . Use of alcohol, tobacco or illicit drugs  . Current medications and supplements . Functional ability and status . Nutritional status . Physical activity . Advanced directives . List of other physicians . Hospitalizations, surgeries, and ER visits in previous 12 months . Vitals . Screenings to include cognitive, depression, and falls . Referrals and appointments  In addition, I have reviewed and discussed with patient certain preventive protocols, quality metrics, and best practice recommendations. A written personalized care plan for preventive services as well as general preventive health recommendations were provided to patient.     Kellie Simmering, LPN   [id number]   Nurse Notes:

## 2020-03-28 NOTE — Patient Instructions (Signed)
[name] , Thank you for taking time to come for your Medicare Wellness Visit. I appreciate your ongoing commitment to your health goals. Please review the following plan we discussed and let me know if I can assist you in the future.   Screening recommendations/referrals: Colonoscopy: completed 07/14/2018, due 07/14/2028 Mammogram: completed 01/29/2020, due 01/28/2021 Bone Density: completed 05/05/2016 Recommended yearly ophthalmology/optometry visit for glaucoma screening and checkup Recommended yearly dental visit for hygiene and checkup  Vaccinations: Influenza vaccine: completed 02/12/2020, due 12/15/2020 Pneumococcal vaccine: completed 03/18/2015 Tdap vaccine: completed 03/18/2015, due 03/17/2025 Shingles vaccine: discussed   Covid-19: 03/13/2020, 08/13/2019, 07/23/2019  Advanced directives: Please bring a copy of your POA (Power of Attorney) and/or Living Will to your next appointment.   Conditions/risks identified: none  Next appointment: Follow up in one year for your annual wellness visit    Preventive Care 65 Years and Older, Female Preventive care refers to lifestyle choices and visits with your health care provider that can promote health and wellness. What does preventive care include?  A yearly physical exam. This is also called an annual well check.  Dental exams once or twice a year.  Routine eye exams. Ask your health care provider how often you should have your eyes checked.  Personal lifestyle choices, including:  Daily care of your teeth and gums.  Regular physical activity.  Eating a healthy diet.  Avoiding tobacco and drug use.  Limiting alcohol use.  Practicing safe sex.  Taking low-dose aspirin every day.  Taking vitamin and mineral supplements as recommended by your health care provider. What happens during an annual well check? The services and screenings done by your health care provider during your annual well check will depend on your age,  overall health, lifestyle risk factors, and family history of disease. Counseling  Your health care provider may ask you questions about your:  Alcohol use.  Tobacco use.  Drug use.  Emotional well-being.  Home and relationship well-being.  Sexual activity.  Eating habits.  History of falls.  Memory and ability to understand (cognition).  Work and work Statistician.  Reproductive health. Screening  You may have the following tests or measurements:  Height, weight, and BMI.  Blood pressure.  Lipid and cholesterol levels. These may be checked every 5 years, or more frequently if you are over 75 years old.  Skin check.  Lung cancer screening. You may have this screening every year starting at age 32 if you have a 30-pack-year history of smoking and currently smoke or have quit within the past 15 years.  Fecal occult blood test (FOBT) of the stool. You may have this test every year starting at age 23.  Flexible sigmoidoscopy or colonoscopy. You may have a sigmoidoscopy every 5 years or a colonoscopy every 10 years starting at age 21.  Hepatitis C blood test.  Hepatitis B blood test.  Sexually transmitted disease (STD) testing.  Diabetes screening. This is done by checking your blood sugar (glucose) after you have not eaten for a while (fasting). You may have this done every 1-3 years.  Bone density scan. This is done to screen for osteoporosis. You may have this done starting at age 27.  Mammogram. This may be done every 1-2 years. Talk to your health care provider about how often you should have regular mammograms. Talk with your health care provider about your test results, treatment options, and if necessary, the need for more tests. Vaccines  Your health care provider may recommend certain vaccines,  such as:  Influenza vaccine. This is recommended every year.  Tetanus, diphtheria, and acellular pertussis (Tdap, Td) vaccine. You may need a Td booster every 10  years.  Zoster vaccine. You may need this after age 81.  Pneumococcal 13-valent conjugate (PCV13) vaccine. One dose is recommended after age 22.  Pneumococcal polysaccharide (PPSV23) vaccine. One dose is recommended after age 25. Talk to your health care provider about which screenings and vaccines you need and how often you need them. This information is not intended to replace advice given to you by your health care provider. Make sure you discuss any questions you have with your health care provider. Document Released: 05/30/2015 Document Revised: 01/21/2016 Document Reviewed: 03/04/2015 Elsevier Interactive Patient Education  2017 [place] Prevention in the Home Falls can cause injuries. They can happen to people of all ages. There are many things you can do to make your home safe and to help prevent falls. What can I do on the outside of my home?  Regularly fix the edges of walkways and driveways and fix any cracks.  Remove anything that might make you trip as you walk through a door, such as a raised step or threshold.  Trim any bushes or trees on the path to your home.  Use bright outdoor lighting.  Clear any walking paths of anything that might make someone trip, such as rocks or tools.  Regularly check to see if handrails are loose or broken. Make sure that both sides of any steps have handrails.  Any raised decks and porches should have guardrails on the edges.  Have any leaves, snow, or ice cleared regularly.  Use sand or salt on walking paths during winter.  Clean up any spills in your garage right away. This includes oil or grease spills. What can I do in the bathroom?  Use night lights.  Install grab bars by the toilet and in the tub and shower. Do not use towel bars as grab bars.  Use non-skid mats or decals in the tub or shower.  If you need to sit down in the shower, use a plastic, non-slip stool.  Keep the floor dry. Clean up any water that  spills on the floor as soon as it happens.  Remove soap buildup in the tub or shower regularly.  Attach bath mats securely with double-sided non-slip rug tape.  Do not have throw rugs and other things on the floor that can make you trip. What can I do in the bedroom?  Use night lights.  Make sure that you have a light by your bed that is easy to reach.  Do not use any sheets or blankets that are too big for your bed. They should not hang down onto the floor.  Have a firm chair that has side arms. You can use this for support while you get dressed.  Do not have throw rugs and other things on the floor that can make you trip. What can I do in the kitchen?  Clean up any spills right away.  Avoid walking on wet floors.  Keep items that you use a lot in easy-to-reach places.  If you need to reach something above you, use a strong step stool that has a grab bar.  Keep electrical cords out of the way.  Do not use floor polish or wax that makes floors slippery. If you must use wax, use non-skid floor wax.  Do not have throw rugs and other things on  the floor that can make you trip. What can I do with my stairs?  Do not leave any items on the stairs.  Make sure that there are handrails on both sides of the stairs and use them. Fix handrails that are broken or loose. Make sure that handrails are as long as the stairways.  Check any carpeting to make sure that it is firmly attached to the stairs. Fix any carpet that is loose or worn.  Avoid having throw rugs at the top or bottom of the stairs. If you do have throw rugs, attach them to the floor with carpet tape.  Make sure that you have a light switch at the top of the stairs and the bottom of the stairs. If you do not have them, ask someone to add them for you. What else can I do to help prevent falls?  Wear shoes that:  Do not have high heels.  Have rubber bottoms.  Are comfortable and fit you well.  Are closed at the  toe. Do not wear sandals.  If you use a stepladder:  Make sure that it is fully opened. Do not climb a closed stepladder.  Make sure that both sides of the stepladder are locked into place.  Ask someone to hold it for you, if possible.  Clearly mark and make sure that you can see:  Any grab bars or handrails.  First and last steps.  Where the edge of each step is.  Use tools that help you move around (mobility aids) if they are needed. These include:  Canes.  Walkers.  Scooters.  Crutches.  Turn on the lights when you go into a dark area. Replace any light bulbs as soon as they burn out.  Set up your furniture so you have a clear path. Avoid moving your furniture around.  If any of your floors are uneven, fix them.  If there are any pets around you, be aware of where they are.  Review your medicines with your doctor. Some medicines can make you feel dizzy. This can increase your chance of falling. Ask your doctor what other things that you can do to help prevent falls. This information is not intended to replace advice given to you by your health care provider. Make sure you discuss any questions you have with your health care provider. Document Released: 02/27/2009 Document Revised: 10/09/2015 Document Reviewed: 06/07/2014 Elsevier Interactive Patient Education  2017 [employer].

## 2020-04-01 ENCOUNTER — Other Ambulatory Visit: Payer: Self-pay | Admitting: Nurse Practitioner

## 2020-04-01 MED ORDER — ROSUVASTATIN CALCIUM 20 MG PO TABS: ORAL_TABLET | ORAL | 0 refills | Status: DC

## 2020-04-01 NOTE — Telephone Encounter (Signed)
Copied from [place] [phone]. Topic: Quick Communication - Rx Refill/Question >> Apr 01, 2020 10:53 AM Leward Quan A wrote: Medication: rosuvastatin (CRESTOR) 20 MG tablet  Completely out per patient has appointment next month  Has the patient contacted their pharmacy? Yes.   (Agent: If no, request that the patient contact the pharmacy for the refill.) (Agent: If yes, when and what did the pharmacy advise?)  Preferred Pharmacy (with phone number or street name): [facility]  - [place]  Phone:  [phone] Fax:  [phone]     Agent: Please be advised that RX refills may take up to 3 business days. We ask that you follow-up with your pharmacy.

## 2020-05-06 ENCOUNTER — Ambulatory Visit (INDEPENDENT_AMBULATORY_CARE_PROVIDER_SITE_OTHER): Payer: Medicare Other | Admitting: Nurse Practitioner

## 2020-05-06 ENCOUNTER — Encounter: Payer: Self-pay | Admitting: Nurse Practitioner

## 2020-05-06 ENCOUNTER — Other Ambulatory Visit: Payer: Self-pay

## 2020-05-06 VITALS — BP 136/74 | HR 84 | Temp 98.3°F | Ht 66.02 in | Wt 227.0 lb

## 2020-05-06 DIAGNOSIS — Z79899 Other long term (current) drug therapy: Secondary | ICD-10-CM

## 2020-05-06 DIAGNOSIS — Z6836 Body mass index (BMI) 36.0-36.9, adult: Secondary | ICD-10-CM | POA: Diagnosis not present

## 2020-05-06 DIAGNOSIS — R748 Abnormal levels of other serum enzymes: Secondary | ICD-10-CM | POA: Diagnosis not present

## 2020-05-06 DIAGNOSIS — M71322 Other bursal cyst, left elbow: Secondary | ICD-10-CM | POA: Insufficient documentation

## 2020-05-06 DIAGNOSIS — I10 Essential (primary) hypertension: Secondary | ICD-10-CM | POA: Diagnosis not present

## 2020-05-06 DIAGNOSIS — E7800 Pure hypercholesterolemia, unspecified: Secondary | ICD-10-CM | POA: Diagnosis not present

## 2020-05-06 DIAGNOSIS — R252 Cramp and spasm: Secondary | ICD-10-CM | POA: Diagnosis not present

## 2020-05-06 DIAGNOSIS — M0579 Rheumatoid arthritis with rheumatoid factor of multiple sites without organ or systems involvement: Secondary | ICD-10-CM | POA: Diagnosis not present

## 2020-05-06 DIAGNOSIS — Z79631 Long term (current) use of antimetabolite agent: Secondary | ICD-10-CM

## 2020-05-06 NOTE — Progress Notes (Signed)
BP 136/74   Pulse 84   Temp 98.3 F (36.8 C) (Oral)   Ht 5' 6.02" (1.677 m)   Wt 227 lb (103 kg)   LMP  (LMP Unknown)   SpO2 99%   BMI 36.61 kg/m    Subjective:    Patient ID: Angela Watts, female    DOB: 16-Sep-1950, 69 y.o.   MRN: [id number]  HPI: Angela Watts is a 69 y.o. female  Chief Complaint  Patient presents with  . Hyperlipidemia  . Hypertension  . Rheumatoid Arthritis   HYPERTENSION / HYPERLIPIDEMIA Continues on ASA, Amlodipine, + Crestor.   Has been walking on treadmill 3-4 days a week and focusing on diet with weight loss present.  Has lost 10 pounds since November 2020. Satisfied with current treatment? yes Duration of hypertension: chronic BP monitoring frequency: two times a week BP range: 130/70 range at home BP medication side effects: no Duration of hyperlipidemia: chronic Cholesterol medication side effects: no Cholesterol supplements: none Medication compliance: good compliance Aspirin: yes Recent stressors: no Recurrent headaches: no Visual changes: no Palpitations: no Dyspnea: no Chest pain: no Lower extremity edema: no Dizzy/lightheaded: no   RHEUMATOID ARTHRITIS: Followed by Dr. Posey Pronto and last seen 02/14/2020.  She is to continue Methotrexate 7 tablets once a week and daily folic acid.  She reports this does help with pain.    LEG CRAMPING On and off for a few months, since she took Covid vaccines.  She reports having steroid shots by ortho in February and March, but there is discussion of placing gel injections -- has not had any further injections to knees.  [facility].   Duration: months Discomfort description:  cramping Pain: yes Location: lower legs Bilateral: yes Symmetric: yes Severity: 6/10 Onset:  sudden Frequency:  intermittent , at night but not every night Symptoms only occur while legs at rest: no Sudden unintentional leg jerking: no Bed partner bothered by leg movements: no LE numbness: no Decreased  sensation: no Weakness: no Insomnia: yes -- when cramping present Daytime somnolence: no Fatigue: no Alleviating factors: stretching legs, Tylenol Aggravating factors: night time Status: fluctuating Treatments attempted: Tylenol  SKIN LESION To left elbow, cyst, noticed in June or July with no changes or pain. Duration: months Location: left elbow Painful: no Itching: no Onset: sudden Context: not changing Associated signs and symptoms: none History of skin cancer: no History of precancerous skin lesions: no Family history of skin cancer: no  Relevant past medical, surgical, family and social history reviewed and updated as indicated. Interim medical history since our last visit reviewed. Allergies and medications reviewed and updated.  Review of Systems  Constitutional: Negative for activity change, appetite change, diaphoresis, fatigue and fever.  Respiratory: Negative for cough, chest tightness, shortness of breath and wheezing.   Cardiovascular: Negative for chest pain, palpitations and leg swelling.  Gastrointestinal: Negative.   Neurological: Negative.   Psychiatric/Behavioral: Negative.     Per HPI unless specifically indicated above     Objective:    BP 136/74   Pulse 84   Temp 98.3 F (36.8 C) (Oral)   Ht 5' 6.02" (1.677 m)   Wt 227 lb (103 kg)   LMP  (LMP Unknown)   SpO2 99%   BMI 36.61 kg/m   Wt Readings from Last 3 Encounters:  05/06/20 227 lb (103 kg)  03/28/20 222 lb (100.7 kg)  02/04/20 228 lb 12.8 oz (103.8 kg)    Physical Exam Vitals and nursing note reviewed.  Constitutional:      General: She is awake. She is not in acute distress.    Appearance: She is well-developed and well-groomed. She is obese. She is not ill-appearing.  HENT:     Head: Normocephalic.     Right Ear: Hearing normal. No drainage.     Left Ear: Hearing normal. No drainage.  Eyes:     General: Lids are normal.        Right eye: No discharge.        Left eye: No  discharge.     Conjunctiva/sclera: Conjunctivae normal.  Neck:     Thyroid: No thyromegaly.     Vascular: No carotid bruit.  Cardiovascular:     Rate and Rhythm: Normal rate and regular rhythm.     Pulses:          Popliteal pulses are 2+ on the right side and 2+ on the left side.       Dorsalis pedis pulses are 2+ on the right side and 2+ on the left side.       Posterior tibial pulses are 2+ on the right side and 2+ on the left side.     Heart sounds: Normal heart sounds. No murmur heard. No gallop.      Comments: Negative Homans bilaterally. Pulmonary:     Effort: Pulmonary effort is normal. No accessory muscle usage or respiratory distress.     Breath sounds: Normal breath sounds.  Abdominal:     General: Bowel sounds are normal.     Palpations: Abdomen is soft.  Musculoskeletal:     Cervical back: Normal range of motion.     Right lower leg: No edema.     Left lower leg: No edema.  Skin:      Neurological:     Mental Status: She is alert and oriented to person, place, and time.  Psychiatric:        Attention and Perception: Attention normal.        Mood and Affect: Mood normal.        Speech: Speech normal.        Behavior: Behavior normal. Behavior is cooperative.        Thought Content: Thought content normal.        Judgment: Judgment normal.     Results for orders placed or performed in visit on 71/06/26  Basic metabolic panel  Result Value Ref Range   Glucose 84 65 - 99 mg/dL   BUN 15 8 - 27 mg/dL   Creatinine, Ser 0.85 0.57 - 1.00 mg/dL   GFR calc non Af Amer 69 >59 mL/min/1.73   GFR calc Af Amer 80 >59 mL/min/1.73   BUN/Creatinine Ratio 18 12 - 28   Sodium 144 134 - 144 mmol/L   Potassium 4.7 3.5 - 5.2 mmol/L   Chloride 108 (H) 96 - 106 mmol/L   CO2 24 20 - 29 mmol/L   Calcium 9.3 8.7 - 10.3 mg/dL  Lipid Panel w/o Chol/HDL Ratio  Result Value Ref Range   Cholesterol, Total 165 100 - 199 mg/dL   Triglycerides 51 0 - 149 mg/dL   HDL 63 >39 mg/dL    VLDL Cholesterol Cal 10 5 - 40 mg/dL   LDL Chol Calc (NIH) 92 0 - 99 mg/dL      Assessment & Plan:   Problem List Items Addressed This Visit      Cardiovascular and Mediastinum   Hypertension    Chronic, stable without HCTZ.  BP at goal at home and on repeat in office today.  Will contiue Amlodipine 5 MG daily and if elevations noted in upcoming months will increase to 10 MG.  Remain off HCTZ due to complaint urinary urgency with this.  CMP and TSH today.  Focus on DASH diet and regular exercise at home.  Monitor BP daily.  Return in 6 months for follow-up.      Relevant Orders   CBC with Differential/Platelet   TSH   Comprehensive metabolic panel     Musculoskeletal and Integument   RA (rheumatoid arthritis) (HCC) - Primary    Chronic, ongoing.  Continue collaboration with rheumatology + current medication regimen.  Check CBC and CMP today.      Relevant Orders   Comprehensive metabolic panel   Other bursal cyst, left elbow    Pea-sized with no discomfort or s/s infection.  Continue to monitor and if symptoms present send to general surgery for assessment.        Other   Hyperlipidemia    Chronic, ongoing.  Continue current medication regimen and adjust as needed.  Lipid panel today.      Relevant Orders   Lipid Panel w/o Chol/HDL Ratio   Obesity    BMI 36.61.  Recommended eating smaller high protein, low fat meals more frequently and exercising 30 mins a day 5 times a week with a goal of 10-15lb weight loss in the next 3 months. Patient voiced their understanding and motivation to adhere to these recommendations.       Elevated alkaline phosphatase level    Recheck on labs today and if consistent trend upwards obtain u/s and refer to GI.      Leg cramping    Ongoing, has had issues with in past.  Check Mag levels and CMP today.  Recommend OTC magnesium supplement to start and regular stretching + return to ortho to check on knees and whether gel needed.  May take  Tylenol as needed.      Relevant Orders   Magnesium   Long term methotrexate user    Check CMP and CBC today.      Relevant Orders   CBC with Differential/Platelet       Follow up plan: Return in about 6 months (around 11/04/2020) for HTN/HLD, RA, LEG CRAMPING.

## 2020-05-06 NOTE — Assessment & Plan Note (Signed)
Check CMP and CBC today.

## 2020-05-06 NOTE — Assessment & Plan Note (Signed)
Chronic, ongoing.  Continue current medication regimen and adjust as needed. Lipid panel today. 

## 2020-05-06 NOTE — Patient Instructions (Signed)
Magnesium 400 MG BID Restless Legs Syndrome Restless legs syndrome is a condition that causes uncomfortable feelings or sensations in the legs, especially while sitting or lying down. The sensations usually cause an overwhelming urge to move the legs. The arms can also sometimes be affected. The condition can range from mild to severe. The symptoms often interfere with a person's ability to sleep. What are the causes? The cause of this condition is not known. What increases the risk? The following factors may make you more likely to develop this condition:  Being older than 50.  Pregnancy.  Being a woman. In general, the condition is more common in women than in men.  A family history of the condition.  Having iron deficiency.  Overuse of caffeine, nicotine, or alcohol.  Certain medical conditions, such as kidney disease, Parkinson's disease, or nerve damage.  Certain medicines, such as those for high blood pressure, nausea, colds, allergies, depression, and some heart conditions. What are the signs or symptoms? The main symptom of this condition is uncomfortable sensations in the legs, such as:  Pulling.  Tingling.  Prickling.  Throbbing.  Crawling.  Burning. Usually, the sensations:  Affect both sides of the body.  Are worse when you sit or lie down.  Are worse at night. These may wake you up or make it difficult to fall asleep.  Make you have a strong urge to move your legs.  Are temporarily relieved by moving your legs. The arms can also be affected, but this is rare. People who have this condition often have tiredness during the day because of their lack of sleep at night. How is this diagnosed? This condition may be diagnosed based on:  Your symptoms.  Blood tests. In some cases, you may be monitored in a sleep lab by a specialist (a sleep study). This can detect any disruptions in your sleep. How is this treated? This condition is treated by managing  the symptoms. This may include:  Lifestyle changes, such as exercising, using relaxation techniques, and avoiding caffeine, alcohol, or tobacco.  Medicines. Anti-seizure medicines may be tried first. Follow these instructions at home:     General instructions  Take over-the-counter and prescription medicines only as told by your health care provider.  Use methods to help relieve the uncomfortable sensations, such as: ? Massaging your legs. ? Walking or stretching. ? Taking a cold or hot bath.  Keep all follow-up visits as told by your health care provider. This is important. Lifestyle  Practice good sleep habits. For example, go to bed and get up at the same time every day. Most adults should get 7-9 hours of sleep each night.  Exercise regularly. Try to get at least 30 minutes of exercise most days of the week.  Practice ways of relaxing, such as yoga or meditation.  Avoid caffeine and alcohol.  Do not use any products that contain nicotine or tobacco, such as cigarettes and e-cigarettes. If you need help quitting, ask your health care provider. Contact a health care provider if:  Your symptoms get worse or they do not improve with treatment. Summary  Restless legs syndrome is a condition that causes uncomfortable feelings or sensations in the legs, especially while sitting or lying down.  The symptoms often interfere with a person's ability to sleep.  This condition is treated by managing the symptoms. You may need to make lifestyle changes or take medicines. This information is not intended to replace advice given to you by your health  care provider. Make sure you discuss any questions you have with your health care provider. Document Revised: 05/23/2017 Document Reviewed: 05/23/2017 Elsevier Patient Education  2020 [employer].

## 2020-05-06 NOTE — Assessment & Plan Note (Signed)
Chronic, stable without HCTZ.  BP at goal at home and on repeat in office today.  Will contiue Amlodipine 5 MG daily and if elevations noted in upcoming months will increase to 10 MG.  Remain off HCTZ due to complaint urinary urgency with this.  CMP and TSH today.  Focus on DASH diet and regular exercise at home.  Monitor BP daily.  Return in 6 months for follow-up.

## 2020-05-06 NOTE — Assessment & Plan Note (Signed)
Ongoing, has had issues with in past.  Check Mag levels and CMP today.  Recommend OTC magnesium supplement to start and regular stretching + return to ortho to check on knees and whether gel needed.  May take Tylenol as needed.

## 2020-05-06 NOTE — Assessment & Plan Note (Signed)
Recheck on labs today and if consistent trend upwards obtain u/s and refer to GI. 

## 2020-05-06 NOTE — Assessment & Plan Note (Signed)
Chronic, ongoing.  Continue collaboration with rheumatology + current medication regimen.  Check CBC and CMP today.

## 2020-05-06 NOTE — Assessment & Plan Note (Signed)
BMI 36.61. Recommended eating smaller high protein, low fat meals more frequently and exercising 30 mins a day 5 times a week with a goal of 10-15lb weight loss in the next 3 months. Patient voiced their understanding and motivation to adhere to these recommendations.  

## 2020-05-06 NOTE — Assessment & Plan Note (Signed)
Pea-sized with no discomfort or s/s infection.  Continue to monitor and if symptoms present send to general surgery for assessment.

## 2020-05-07 LAB — LIPID PANEL W/O CHOL/HDL RATIO
Cholesterol, Total: 170 mg/dL (ref 100–199)
HDL: 63 mg/dL (ref >39)
LDL Chol Calc (NIH): 95 mg/dL (ref 0–99)
Triglycerides: 59 mg/dL (ref 0–149)
VLDL Cholesterol Cal: 12 mg/dL (ref 5–40)

## 2020-05-07 LAB — COMPREHENSIVE METABOLIC PANEL
ALT: 17 IU/L (ref 0–32)
AST: 20 IU/L (ref 0–40)
Albumin/Globulin Ratio: 1.4 (ref 1.2–2.2)
Albumin: 4.3 g/dL (ref 3.7–4.7)
Alkaline Phosphatase: 162 IU/L — ABNORMAL HIGH (ref 44–121)
BUN/Creatinine Ratio: 11 — ABNORMAL LOW (ref 12–28)
BUN: 9 mg/dL (ref 8–27)
Bilirubin Total: 0.5 mg/dL (ref 0.0–1.2)
CO2: 20 mmol/L (ref 20–29)
Calcium: 9.2 mg/dL (ref 8.7–10.3)
Chloride: 106 mmol/L (ref 96–106)
Creatinine, Ser: 0.84 mg/dL (ref 0.57–1.00)
GFR calc Af Amer: 81 mL/min/{1.73_m2} (ref >59)
GFR calc non Af Amer: 70 mL/min/{1.73_m2} (ref >59)
Globulin, Total: 3 g/dL (ref 1.5–4.5)
Glucose: 94 mg/dL (ref 65–99)
Potassium: 4.2 mmol/L (ref 3.5–5.2)
Sodium: 143 mmol/L (ref 134–144)
Total Protein: 7.3 g/dL (ref 6.0–8.5)

## 2020-05-07 LAB — CBC WITH DIFFERENTIAL/PLATELET
Basophils Absolute: 0 10*3/uL (ref 0.0–0.2)
Basos: 1 %
EOS (ABSOLUTE): 0.1 10*3/uL (ref 0.0–0.4)
Eos: 2 %
Hematocrit: 40.7 % (ref 34.0–46.6)
Hemoglobin: 13.6 g/dL (ref 11.1–15.9)
Immature Grans (Abs): 0 10*3/uL (ref 0.0–0.1)
Immature Granulocytes: 0 %
Lymphocytes Absolute: 1.8 10*3/uL (ref 0.7–3.1)
Lymphs: 40 %
MCH: 30.4 pg (ref 26.6–33.0)
MCHC: 33.4 g/dL (ref 31.5–35.7)
MCV: 91 fL (ref 79–97)
Monocytes Absolute: 0.4 10*3/uL (ref 0.1–0.9)
Monocytes: 9 %
Neutrophils Absolute: 2.2 10*3/uL (ref 1.4–7.0)
Neutrophils: 48 %
Platelets: 286 10*3/uL (ref 150–450)
RBC: 4.47 x10E6/uL (ref 3.77–5.28)
RDW: 14.4 % (ref 11.7–15.4)
WBC: 4.5 10*3/uL (ref 3.4–10.8)

## 2020-05-07 LAB — MAGNESIUM: Magnesium: 2.1 mg/dL (ref 1.6–2.3)

## 2020-05-07 LAB — TSH: TSH: 1.59 u[IU]/mL (ref 0.450–4.500)

## 2020-05-07 NOTE — Progress Notes (Signed)
Good afternoon, please let Angela Watts know that overall labs remain stable.  Alkaline phosphatase, which we have been monitoring remains stable, no increase in level.   Will continue to monitor at visits.  No changes needed at this time.  Have a wonderful day!! Keep being awesome!!  Thank you for allowing me to participate in your care. Kindest regards, Axle Parfait

## 2020-05-26 ENCOUNTER — Other Ambulatory Visit: Payer: Self-pay | Admitting: Nurse Practitioner

## 2020-05-26 NOTE — Telephone Encounter (Signed)
Requested Prescriptions  Pending Prescriptions Disp Refills  . amLODipine (NORVASC) 5 MG tablet [Pharmacy Med Name: amLODIPine Besylate 5 MG Oral Tablet] 90 tablet 1    Sig: Take 1 tablet by mouth once daily     Cardiovascular:  Calcium Channel Blockers Passed - 05/26/2020  3:46 PM      Passed - Last BP in normal range    BP Readings from Last 1 Encounters:  05/06/20 136/74         Passed - Valid encounter within last 6 months    Recent Outpatient Visits          2 weeks ago Rheumatoid arthritis involving multiple sites with positive rheumatoid factor ([place])   [place], Jolene T, NP   6 months ago Rheumatoid arthritis involving multiple sites with positive rheumatoid factor ([place])   [place], Jolene T, NP   11 months ago Acute pain of right knee   [place], Barbaraann Faster, NP   12 months ago Essential hypertension   [place], [place] T, NP   1 year ago Annual physical exam   [place], Barbaraann Faster, NP      Future Appointments            In 5 months [name], Barbaraann Faster, NP [facility]   In 10 months  [facility]

## 2020-07-09 ENCOUNTER — Other Ambulatory Visit: Payer: Self-pay

## 2020-07-09 MED ORDER — ROSUVASTATIN CALCIUM 20 MG PO TABS: ORAL_TABLET | ORAL | 4 refills | Status: DC

## 2020-07-09 MED ORDER — AMLODIPINE BESYLATE 5 MG PO TABS: 5.0000 mg | ORAL_TABLET | Freq: Every day | ORAL | 4 refills | Status: DC

## 2020-07-09 NOTE — Telephone Encounter (Signed)
Patient is requesting medication to be sent to [facility] and it looks like it was sent to a local [place].

## 2020-07-10 DIAGNOSIS — L409 Psoriasis, unspecified: Secondary | ICD-10-CM | POA: Diagnosis not present

## 2020-07-10 DIAGNOSIS — M059 Rheumatoid arthritis with rheumatoid factor, unspecified: Secondary | ICD-10-CM | POA: Diagnosis not present

## 2020-07-10 DIAGNOSIS — Z79899 Other long term (current) drug therapy: Secondary | ICD-10-CM | POA: Diagnosis not present

## 2020-07-14 ENCOUNTER — Other Ambulatory Visit: Payer: Self-pay

## 2020-07-14 MED ORDER — ROSUVASTATIN CALCIUM 20 MG PO TABS: ORAL_TABLET | ORAL | 4 refills | Status: DC

## 2020-07-14 MED ORDER — AMLODIPINE BESYLATE 5 MG PO TABS: 5.0000 mg | ORAL_TABLET | Freq: Every day | ORAL | 4 refills | Status: DC

## 2020-07-14 NOTE — Telephone Encounter (Signed)
Patient now using mail order and needs resent to [facility].

## 2020-08-06 ENCOUNTER — Telehealth: Payer: Self-pay

## 2020-08-06 NOTE — Telephone Encounter (Signed)
Patient notified that she can complete the amlodipine that she has before starting the new bottle.

## 2020-08-06 NOTE — Telephone Encounter (Signed)
Copied from [place] [id number]. Topic: General - Other >> Aug 06, 2020  2:42 PM Pawlus, Brayton Layman A wrote: Reason for CRM: Pt wanted to ask Jolene or her nurse a question regarding her medication, pt stated she got a new prescription sent in through [facility] and is not sure if she is supposed to finish her old prescription because she still has a few pills left. Please advise.

## 2020-11-04 ENCOUNTER — Other Ambulatory Visit: Payer: Self-pay

## 2020-11-04 ENCOUNTER — Ambulatory Visit (INDEPENDENT_AMBULATORY_CARE_PROVIDER_SITE_OTHER): Payer: Medicare Other | Admitting: Nurse Practitioner

## 2020-11-04 ENCOUNTER — Encounter: Payer: Self-pay | Admitting: Nurse Practitioner

## 2020-11-04 VITALS — BP 120/69 | HR 73 | Temp 98.4°F | Wt 225.2 lb

## 2020-11-04 DIAGNOSIS — R748 Abnormal levels of other serum enzymes: Secondary | ICD-10-CM | POA: Diagnosis not present

## 2020-11-04 DIAGNOSIS — Z79631 Long term (current) use of antimetabolite agent: Secondary | ICD-10-CM

## 2020-11-04 DIAGNOSIS — Z6836 Body mass index (BMI) 36.0-36.9, adult: Secondary | ICD-10-CM

## 2020-11-04 DIAGNOSIS — E559 Vitamin D deficiency, unspecified: Secondary | ICD-10-CM

## 2020-11-04 DIAGNOSIS — M069 Rheumatoid arthritis, unspecified: Secondary | ICD-10-CM

## 2020-11-04 DIAGNOSIS — M170 Bilateral primary osteoarthritis of knee: Secondary | ICD-10-CM

## 2020-11-04 DIAGNOSIS — E7800 Pure hypercholesterolemia, unspecified: Secondary | ICD-10-CM | POA: Diagnosis not present

## 2020-11-04 DIAGNOSIS — Z79899 Other long term (current) drug therapy: Secondary | ICD-10-CM

## 2020-11-04 DIAGNOSIS — I10 Essential (primary) hypertension: Secondary | ICD-10-CM

## 2020-11-04 NOTE — Assessment & Plan Note (Signed)
BMI 36.32.  Recommended eating smaller high protein, low fat meals more frequently and exercising 30 mins a day 5 times a week with a goal of 10-15lb weight loss in the next 3 months. Patient voiced their understanding and motivation to adhere to these recommendations.

## 2020-11-04 NOTE — Assessment & Plan Note (Signed)
Chronic, ongoing.  Continue current medication regimen and adjust as needed. Lipid panel today. 

## 2020-11-04 NOTE — Assessment & Plan Note (Signed)
Chronic, ongoing.  Continue collaboration with rheumatology + current medication regimen.  Check CBC and CMP today.

## 2020-11-04 NOTE — Assessment & Plan Note (Signed)
Chronic, stable.  BP at goal at home andin office.  Will contiue Amlodipine 5 MG daily and if elevations noted in upcoming months will increase to 10 MG.  Remain off HCTZ due to complaint urinary urgency with this in past.  CMP and CBC today.  Focus on DASH diet and regular exercise at home.  Monitor BP daily.  Return in 6 months for follow-up.

## 2020-11-04 NOTE — Assessment & Plan Note (Signed)
Check CMP and CBC today.

## 2020-11-04 NOTE — Patient Instructions (Signed)
Zoster Vaccine, Recombinant injection What is this medication? ZOSTER VACCINE (ZOS ter vak SEEN) is a vaccine used to reduce the risk of getting shingles. This vaccine is not used to treat shingles or nerve pain fromshingles. This medicine may be used for other purposes; ask your health care provider orpharmacist if you have questions. COMMON BRAND NAME(S): [facility] What should I tell my care team before I take this medication? They need to know if you have any of these conditions: cancer immune system problems an unusual or allergic reaction to Zoster vaccine, other medications, foods, dyes, or preservatives pregnant or trying to get pregnant breast-feeding How should I use this medication? This vaccine is injected into a muscle. It is given by a health care provider. A copy of Vaccine Information Statements will be given before each vaccination. Be sure to read this information carefully each time. This sheet may changeoften. Talk to your health care provider about the use of this vaccine in children.This vaccine is not approved for use in children. Overdosage: If you think you have taken too much of this medicine contact apoison control center or emergency room at once. NOTE: This medicine is only for you. Do not share this medicine with others. What if I miss a dose? Keep appointments for follow-up (booster) doses. It is important not to miss your dose. Call your health care provider if you are unable to keep anappointment. What may interact with this medication? medicines that suppress your immune system medicines to treat cancer steroid medicines like prednisone or cortisone This list may not describe all possible interactions. Give your health care provider a list of all the medicines, herbs, non-prescription drugs, or dietary supplements you use. Also tell them if you smoke, drink alcohol, or use illegaldrugs. Some items may interact with your medicine. What should I watch for while  using this medication? Visit your health care provider regularly. This vaccine, like all vaccines, may not fully protect everyone. What side effects may I notice from receiving this medication? Side effects that you should report to your doctor or health care professionalas soon as possible: allergic reactions (skin rash, itching or hives; swelling of the face, lips, or tongue) trouble breathing Side effects that usually do not require medical attention (report these toyour doctor or health care professional if they continue or are bothersome): chills headache fever nausea pain, redness, or irritation at site where injected tiredness vomiting This list may not describe all possible side effects. Call your doctor for medical advice about side effects. You may report side effects to FDA at1-800-FDA-1088. Where should I keep my medication? This vaccine is only given by a health care provider. It will not be stored athome. NOTE: This sheet is a summary. It may not cover all possible information. If you have questions about this medicine, talk to your doctor, pharmacist, orhealth care provider.  2022 Elsevier/Gold Standard (2019-06-08 16:23:07)

## 2020-11-04 NOTE — Assessment & Plan Note (Signed)
Recheck on labs today and if consistent trend upwards obtain u/s and refer to GI. 

## 2020-11-04 NOTE — Assessment & Plan Note (Signed)
Ongoing to knees, will continue collaboration with ortho and recommend she return to see them since injections wearing off and having right knee discomfort.

## 2020-11-04 NOTE — Progress Notes (Signed)
BP 120/69   Pulse 73   Temp 98.4 F (36.9 C) (Oral)   Wt 225 lb 3.2 oz (102.2 kg)   LMP  (LMP Unknown)   SpO2 97%   BMI 36.32 kg/m    Subjective:    Patient ID: Angela Watts, female    DOB: 1951/04/12, 70 y.o.   MRN: [id number]  HPI: Angela Watts is a 70 y.o. female  Chief Complaint  Patient presents with   Hyperlipidemia   Hypertension   Arthritis   Knee Pain    Patient states she was in here in March due to knee pain and states she has received 2 injections for her knee from [facility] and states that the pain comes and goes. Patient denies having any pain today in both knees.    HYPERTENSION / HYPERLIPIDEMIA Continues on ASA, Amlodipine, + Crestor.   Has been walking on treadmill 2 days a week and focusing on diet.    Has history of mild elevation ALK PHOS, ongoing, last 162.  Denies any pain or N&V.   Satisfied with current treatment? yes Duration of hypertension: chronic BP monitoring frequency: two times a week BP range: 130/70- 80 range at home BP medication side effects: no Duration of hyperlipidemia: chronic Cholesterol medication side effects: no Cholesterol supplements: none Medication compliance: good compliance Aspirin: yes Recent stressors: no Recurrent headaches: no Visual changes: no Palpitations: no Dyspnea: no Chest pain: no Lower extremity edema: no Dizzy/lightheaded: no    RHEUMATOID ARTHRITIS: Followed by Dr. Posey Pronto and last seen 07/10/20.  She is to continue Methotrexate 7 tablets once a week and daily folic acid.  She reports this does help with pain.  Was seen by ortho for knee pain, last in March 2022 -- has had 2 injections of steroid, last in March -- in both knees.  Pain did improve with injections, was reported to have arthritis in knees.  She reports pain is returning and needs to return to ortho -- reports R>L knee pain.  Pain is not present every day, but  nagging pain.  DEXA in 2017 was normal.  Relevant past medical,  surgical, family and social history reviewed and updated as indicated. Interim medical history since our last visit reviewed. Allergies and medications reviewed and updated.  Review of Systems  Constitutional:  Negative for activity change, appetite change, diaphoresis, fatigue and fever.  Respiratory:  Negative for cough, chest tightness, shortness of breath and wheezing.   Cardiovascular:  Negative for chest pain, palpitations and leg swelling.  Gastrointestinal: Negative.   Neurological: Negative.   Psychiatric/Behavioral: Negative.     Per HPI unless specifically indicated above     Objective:    BP 120/69   Pulse 73   Temp 98.4 F (36.9 C) (Oral)   Wt 225 lb 3.2 oz (102.2 kg)   LMP  (LMP Unknown)   SpO2 97%   BMI 36.32 kg/m   Wt Readings from Last 3 Encounters:  11/04/20 225 lb 3.2 oz (102.2 kg)  05/06/20 227 lb (103 kg)  03/28/20 222 lb (100.7 kg)    Physical Exam Vitals and nursing note reviewed.  Constitutional:      General: She is awake. She is not in acute distress.    Appearance: She is well-developed and well-groomed. She is obese. She is not ill-appearing.  HENT:     Head: Normocephalic.     Right Ear: Hearing normal. No drainage.     Left Ear: Hearing normal. No drainage.  Eyes:     General: Lids are normal.        Right eye: No discharge.        Left eye: No discharge.     Conjunctiva/sclera: Conjunctivae normal.  Neck:     Thyroid: No thyromegaly.     Vascular: No carotid bruit.  Cardiovascular:     Rate and Rhythm: Normal rate and regular rhythm.     Heart sounds: Normal heart sounds. No murmur heard.   No gallop.  Pulmonary:     Effort: Pulmonary effort is normal. No accessory muscle usage or respiratory distress.     Breath sounds: Normal breath sounds.  Abdominal:     General: Bowel sounds are normal.     Palpations: Abdomen is soft.  Musculoskeletal:     Cervical back: Normal range of motion.     Right lower leg: No edema.     Left  lower leg: No edema.  Neurological:     Mental Status: She is alert and oriented to person, place, and time.  Psychiatric:        Attention and Perception: Attention normal.        Mood and Affect: Mood normal.        Speech: Speech normal.        Behavior: Behavior normal. Behavior is cooperative.        Thought Content: Thought content normal.        Judgment: Judgment normal.   Results for orders placed or performed in visit on 05/06/20  CBC with Differential/Platelet  Result Value Ref Range   WBC 4.5 3.4 - 10.8 x10E3/uL   RBC 4.47 3.77 - 5.28 x10E6/uL   Hemoglobin 13.6 11.1 - 15.9 g/dL   Hematocrit 40.7 34.0 - 46.6 %   MCV 91 79 - 97 fL   MCH 30.4 26.6 - 33.0 pg   MCHC 33.4 31.5 - 35.7 g/dL   RDW 14.4 11.7 - 15.4 %   Platelets 286 150 - 450 x10E3/uL   Neutrophils 48 Not Estab. %   Lymphs 40 Not Estab. %   Monocytes 9 Not Estab. %   Eos 2 Not Estab. %   Basos 1 Not Estab. %   Neutrophils Absolute 2.2 1.4 - 7.0 x10E3/uL   Lymphocytes Absolute 1.8 0.7 - 3.1 x10E3/uL   Monocytes Absolute 0.4 0.1 - 0.9 x10E3/uL   EOS (ABSOLUTE) 0.1 0.0 - 0.4 x10E3/uL   Basophils Absolute 0.0 0.0 - 0.2 x10E3/uL   Immature Granulocytes 0 Not Estab. %   Immature Grans (Abs) 0.0 0.0 - 0.1 x10E3/uL  Lipid Panel w/o Chol/HDL Ratio  Result Value Ref Range   Cholesterol, Total 170 100 - 199 mg/dL   Triglycerides 59 0 - 149 mg/dL   HDL 63 >39 mg/dL   VLDL Cholesterol Cal 12 5 - 40 mg/dL   LDL Chol Calc (NIH) 95 0 - 99 mg/dL  TSH  Result Value Ref Range   TSH 1.590 0.450 - 4.500 uIU/mL  Comprehensive metabolic panel  Result Value Ref Range   Glucose 94 65 - 99 mg/dL   BUN 9 8 - 27 mg/dL   Creatinine, Ser 0.84 0.57 - 1.00 mg/dL   GFR calc non Af Amer 70 >59 mL/min/1.73   GFR calc Af Amer 81 >59 mL/min/1.73   BUN/Creatinine Ratio 11 (L) 12 - 28   Sodium 143 134 - 144 mmol/L   Potassium 4.2 3.5 - 5.2 mmol/L   Chloride 106 96 - 106 mmol/L     CO2 20 20 - 29 mmol/L   Calcium 9.2 8.7 - 10.3  mg/dL   Total Protein 7.3 6.0 - 8.5 g/dL   Albumin 4.3 3.7 - 4.7 g/dL   Globulin, Total 3.0 1.5 - 4.5 g/dL   Albumin/Globulin Ratio 1.4 1.2 - 2.2   Bilirubin Total 0.5 0.0 - 1.2 mg/dL   Alkaline Phosphatase 162 (H) 44 - 121 IU/L   AST 20 0 - 40 IU/L   ALT 17 0 - 32 IU/L  Magnesium  Result Value Ref Range   Magnesium 2.1 1.6 - 2.3 mg/dL      Assessment & Plan:   Problem List Items Addressed This Visit       Cardiovascular and Mediastinum   Hypertension    Chronic, stable.  BP at goal at home andin office.  Will contiue Amlodipine 5 MG daily and if elevations noted in upcoming months will increase to 10 MG.  Remain off HCTZ due to complaint urinary urgency with this in past.  CMP and CBC today.  Focus on DASH diet and regular exercise at home.  Monitor BP daily.  Return in 6 months for follow-up.       Relevant Orders   CBC with Differential/Platelet     Musculoskeletal and Integument   RA (rheumatoid arthritis) (HCC)    Chronic, ongoing.  Continue collaboration with rheumatology + current medication regimen.  Check CBC and CMP today.       Osteoarthritis    Ongoing to knees, will continue collaboration with ortho and recommend she return to see them since injections wearing off and having right knee discomfort.         Other   Hyperlipidemia    Chronic, ongoing.  Continue current medication regimen and adjust as needed.  Lipid panel today.       Relevant Orders   Lipid Panel w/o Chol/HDL Ratio   Obesity - Primary    BMI 36.32.  Recommended eating smaller high protein, low fat meals more frequently and exercising 30 mins a day 5 times a week with a goal of 10-15lb weight loss in the next 3 months. Patient voiced their understanding and motivation to adhere to these recommendations.        Elevated alkaline phosphatase level    Recheck on labs today and if consistent trend upwards obtain u/s and refer to GI.       Relevant Orders   Comprehensive metabolic  panel   Long term methotrexate user    Check CMP and CBC today.       Other Visit Diagnoses     Vitamin D deficiency       History of low levels, check today and initiate supplement as needed.   Relevant Orders   VITAMIN D 25 Hydroxy (Vit-D Deficiency, Fractures)        Follow up plan: Return in about 6 months (around 05/06/2021) for HTN/HLD, RA, OA.

## 2020-11-05 ENCOUNTER — Other Ambulatory Visit: Payer: Self-pay | Admitting: Nurse Practitioner

## 2020-11-05 DIAGNOSIS — R899 Unspecified abnormal finding in specimens from other organs, systems and tissues: Secondary | ICD-10-CM

## 2020-11-05 LAB — CBC WITH DIFFERENTIAL/PLATELET
Basophils Absolute: 0 10*3/uL (ref 0.0–0.2)
Basos: 1 %
EOS (ABSOLUTE): 0.1 10*3/uL (ref 0.0–0.4)
Eos: 2 %
Hematocrit: 38.7 % (ref 34.0–46.6)
Hemoglobin: 12.9 g/dL (ref 11.1–15.9)
Immature Grans (Abs): 0 10*3/uL (ref 0.0–0.1)
Immature Granulocytes: 0 %
Lymphocytes Absolute: 1.3 10*3/uL (ref 0.7–3.1)
Lymphs: 42 %
MCH: 30.7 pg (ref 26.6–33.0)
MCHC: 33.3 g/dL (ref 31.5–35.7)
MCV: 92 fL (ref 79–97)
Monocytes Absolute: 0.3 10*3/uL (ref 0.1–0.9)
Monocytes: 9 %
Neutrophils Absolute: 1.5 10*3/uL (ref 1.4–7.0)
Neutrophils: 46 %
Platelets: 252 10*3/uL (ref 150–450)
RBC: 4.2 x10E6/uL (ref 3.77–5.28)
RDW: 14.2 % (ref 11.7–15.4)
WBC: 3.2 10*3/uL — ABNORMAL LOW (ref 3.4–10.8)

## 2020-11-05 LAB — COMPREHENSIVE METABOLIC PANEL
ALT: 12 IU/L (ref 0–32)
AST: 15 IU/L (ref 0–40)
Albumin/Globulin Ratio: 1.7 (ref 1.2–2.2)
Albumin: 4.3 g/dL (ref 3.7–4.7)
Alkaline Phosphatase: 156 IU/L — ABNORMAL HIGH (ref 44–121)
BUN/Creatinine Ratio: 12 (ref 12–28)
BUN: 10 mg/dL (ref 8–27)
Bilirubin Total: 0.4 mg/dL (ref 0.0–1.2)
CO2: 21 mmol/L (ref 20–29)
Calcium: 9.2 mg/dL (ref 8.7–10.3)
Chloride: 106 mmol/L (ref 96–106)
Creatinine, Ser: 0.83 mg/dL (ref 0.57–1.00)
Globulin, Total: 2.6 g/dL (ref 1.5–4.5)
Glucose: 80 mg/dL (ref 65–99)
Potassium: 4.4 mmol/L (ref 3.5–5.2)
Sodium: 144 mmol/L (ref 134–144)
Total Protein: 6.9 g/dL (ref 6.0–8.5)
eGFR: 75 mL/min/{1.73_m2} (ref >59)

## 2020-11-05 LAB — LIPID PANEL W/O CHOL/HDL RATIO
Cholesterol, Total: 156 mg/dL (ref 100–199)
HDL: 60 mg/dL (ref >39)
LDL Chol Calc (NIH): 86 mg/dL (ref 0–99)
Triglycerides: 49 mg/dL (ref 0–149)
VLDL Cholesterol Cal: 10 mg/dL (ref 5–40)

## 2020-11-05 LAB — VITAMIN D 25 HYDROXY (VIT D DEFICIENCY, FRACTURES): Vit D, 25-Hydroxy: 34.3 ng/mL (ref 30.0–100.0)

## 2020-11-05 NOTE — Progress Notes (Signed)
Good morning, please let Karlynn know her labs have returned.  Everything is stable and in good ranges with exception of alkaline phosphatase remaining slightly elevated, but lower then previous check.  We will continue to monitor closely.  Your white blood cell count was a little low this check.  I would like you to return in 4 weeks to recheck this via outpatient labs, please schedule lab only visit for this.  Any questions?  Continue all current medications. Keep being awesome!!  Thank you for allowing me to participate in your care.  I appreciate you. Kindest regards, Henrine Screws'

## 2020-11-07 DIAGNOSIS — L409 Psoriasis, unspecified: Secondary | ICD-10-CM | POA: Diagnosis not present

## 2020-11-07 DIAGNOSIS — M059 Rheumatoid arthritis with rheumatoid factor, unspecified: Secondary | ICD-10-CM | POA: Diagnosis not present

## 2020-11-07 DIAGNOSIS — M170 Bilateral primary osteoarthritis of knee: Secondary | ICD-10-CM | POA: Diagnosis not present

## 2020-11-07 DIAGNOSIS — Z79899 Other long term (current) drug therapy: Secondary | ICD-10-CM | POA: Diagnosis not present

## 2020-11-21 DIAGNOSIS — M170 Bilateral primary osteoarthritis of knee: Secondary | ICD-10-CM | POA: Diagnosis not present

## 2020-12-04 ENCOUNTER — Other Ambulatory Visit: Payer: Medicare Other

## 2020-12-04 ENCOUNTER — Other Ambulatory Visit: Payer: Self-pay

## 2020-12-04 DIAGNOSIS — R899 Unspecified abnormal finding in specimens from other organs, systems and tissues: Secondary | ICD-10-CM

## 2020-12-05 LAB — CBC WITH DIFFERENTIAL/PLATELET
Basophils Absolute: 0 10*3/uL (ref 0.0–0.2)
Basos: 1 %
EOS (ABSOLUTE): 0.1 10*3/uL (ref 0.0–0.4)
Eos: 1 %
Hematocrit: 40.9 % (ref 34.0–46.6)
Hemoglobin: 13.4 g/dL (ref 11.1–15.9)
Immature Grans (Abs): 0 10*3/uL (ref 0.0–0.1)
Immature Granulocytes: 0 %
Lymphocytes Absolute: 2.2 10*3/uL (ref 0.7–3.1)
Lymphs: 36 %
MCH: 30.3 pg (ref 26.6–33.0)
MCHC: 32.8 g/dL (ref 31.5–35.7)
MCV: 93 fL (ref 79–97)
Monocytes Absolute: 0.7 10*3/uL (ref 0.1–0.9)
Monocytes: 11 %
Neutrophils Absolute: 3.1 10*3/uL (ref 1.4–7.0)
Neutrophils: 51 %
Platelets: 225 10*3/uL (ref 150–450)
RBC: 4.42 x10E6/uL (ref 3.77–5.28)
RDW: 14.6 % (ref 11.7–15.4)
WBC: 6 10*3/uL (ref 3.4–10.8)

## 2020-12-05 NOTE — Progress Notes (Signed)
Good morning, please let Angela Watts know that her labs returned normal this check.  No further intervention needed at this time.  Have a great weekend. Keep being awesome!!  Thank you for allowing me to participate in your care.  I appreciate you. Kindest regards, [name]

## 2020-12-26 ENCOUNTER — Other Ambulatory Visit: Payer: Self-pay

## 2020-12-26 DIAGNOSIS — D242 Benign neoplasm of left breast: Secondary | ICD-10-CM

## 2021-01-29 ENCOUNTER — Ambulatory Visit
Admission: RE | Admit: 2021-01-29 | Discharge: 2021-01-29 | Disposition: A | Discharge status: Home / Self Care | Payer: Medicare Other | Source: Ambulatory Visit | Attending: Surgery | Admitting: Surgery

## 2021-01-29 ENCOUNTER — Other Ambulatory Visit: Payer: Self-pay

## 2021-01-29 DIAGNOSIS — D242 Benign neoplasm of left breast: Secondary | ICD-10-CM | POA: Diagnosis not present

## 2021-01-29 DIAGNOSIS — R922 Inconclusive mammogram: Secondary | ICD-10-CM | POA: Diagnosis not present

## 2021-02-16 ENCOUNTER — Ambulatory Visit (INDEPENDENT_AMBULATORY_CARE_PROVIDER_SITE_OTHER): Payer: Medicare Other | Admitting: Surgery

## 2021-02-16 ENCOUNTER — Other Ambulatory Visit: Payer: Self-pay

## 2021-02-16 ENCOUNTER — Encounter: Payer: Self-pay | Admitting: Surgery

## 2021-02-16 VITALS — BP 156/72 | HR 106 | Temp 98.5°F | Ht 67.0 in | Wt 227.0 lb

## 2021-02-16 DIAGNOSIS — D242 Benign neoplasm of left breast: Secondary | ICD-10-CM

## 2021-02-16 NOTE — Patient Instructions (Signed)
Please follow up yearly with your primary care physician for your Mammograms and breast exams.   Breast Self-Awareness Breast self-awareness is knowing how your breasts look and feel. Doing breast self-awareness is important. It allows you to catch a breast problem early while it is still small and can be treated. All women should do breast self-awareness, including women who have had breast implants. Tell your doctor if you notice a change in your breasts. What you need: A mirror. A well-lit room. How to do a breast self-exam A breast self-exam is one way to learn what is normal for your breasts and to check for changes. To do a breast self-exam: Look for changes  Take off all the clothes above your waist. Stand in front of a mirror in a room with good lighting. Put your hands on your hips. Push your hands down. Look at your breasts and nipples in the mirror to see if one breast or nipple looks different from the other. Check to see if: The shape of one breast is different. The size of one breast is different. There are wrinkles, dips, and bumps in one breast and not the other. Look at each breast for changes in the skin, such as: Redness. Scaly areas. Look for changes in your nipples, such as: Liquid around the nipples. Bleeding. Dimpling. Redness. A change in where the nipples are. Feel for changes  Lie on your back on the floor. Feel each breast. To do this, follow these steps: Pick a breast to feel. Put the arm closest to that breast above your head. Use your other arm to feel the nipple area of your breast. Feel the area with the pads of your three middle fingers by making small circles with your fingers. For the first circle, press lightly. For the second circle, press harder. For the third circle, press even harder. Keep making circles with your fingers at the different pressures as you move down your breast. Stop when you feel your ribs. Move your fingers a little toward  the center of your body. Start making circles with your fingers again, this time going up until you reach your collarbone. Keep making up-and-down circles until you reach your armpit. Remember to keep using the three pressures. Feel the other breast in the same way. Sit or stand in the tub or shower. With soapy water on your skin, feel each breast the same way you did in step 2 when you were lying on the floor. Write down what you find Writing down what you find can help you remember what to tell your doctor. Write down: What is normal for each breast. Any changes you find in each breast, including: The kind of changes you find. Whether you have pain. Size and location of any lumps. When you last had your menstrual period. General tips Check your breasts every month. If you are breastfeeding, the best time to check your breasts is after you feed your baby or after you use a breast pump. If you get menstrual periods, the best time to check your breasts is 5-7 days after your menstrual period is over. With time, you will become comfortable with the self-exam, and you will begin to know if there are changes in your breasts. Contact a doctor if you: See a change in the shape or size of your breasts or nipples. See a change in the skin of your breast or nipples, such as red or scaly skin. Have fluid coming from your nipples that  is not normal. Find a lump or thick area that was not there before. Have pain in your breasts. Have any concerns about your breast health. Summary Breast self-awareness includes looking for changes in your breasts, as well as feeling for changes within your breasts. Breast self-awareness should be done in front of a mirror in a well-lit room. You should check your breasts every month. If you get menstrual periods, the best time to check your breasts is 5-7 days after your menstrual period is over. Let your doctor know of any changes you see in your breasts, including  changes in size, changes on the skin, pain or tenderness, or fluid from your nipples that is not normal. This information is not intended to replace advice given to you by your health care provider. Make sure you discuss any questions you have with your health care provider. Document Revised: 12/20/2017 Document Reviewed: 12/20/2017 Elsevier Patient Education  [place].

## 2021-02-16 NOTE — Progress Notes (Signed)
02/16/2021  History of Present Illness: Angela Watts is a 70 y.o. female presenting for follow up of left breast intraductal papillomas.  She's had a total of three biopsies in the left breast in 2011, 2018, and 2020.  All have showed intraductal papilloma without any atypia.  Since her last biopsy in 2020, she's had mammogram in 01/2020 and most recently on 01/29/21 which have been stable without any new or suspicious findings.  I have personally viewed the images.  She denies any new symptoms or issues, and denies specifically any breast pain, masses, or nipple/skin changes.  Past Medical History: Past Medical History:  Diagnosis Date   Abnormal Pap smear of cervix    Abscess of bursa, left hip    Arthritis    Colon adenomas    GERD (gastroesophageal reflux disease)    Hyperlipidemia    Hypertension    Psoriasis    Rheumatoid arthritis flare ([place])      Past Surgical History: Past Surgical History:  Procedure Laterality Date   ABDOMINAL HYSTERECTOMY  2003   BREAST BIOPSY Left 06/01/2016   [place] bx done by dr byrnett 6:00 2cmfn intraducal papilloma with calcs   BREAST BIOPSY Left 02/05/2019   Affirm bx-"X" clip-benign parenchyma with fragments of sclerosed intraductal papilloma   BREAST BIOPSY Left 2012   [place] bx done by Dr. Bary Castilla, intraductal papilloma   BREAST CYST ASPIRATION Left 2011   NEG/ Br Byrnett   BREAST CYST ASPIRATION Left 06/01/2016   dr byrnett 5:00 3cmfn aspirated with complete resolution   COLONOSCOPY WITH PROPOFOL N/A 06/11/2015   Procedure: COLONOSCOPY WITH PROPOFOL;  Surgeon: Manya Silvas, MD;  Location[facility] ENDOSCOPY;  Service: Endoscopy;  Laterality: N/A;   COLONOSCOPY WITH PROPOFOL N/A 07/14/2018   Procedure: COLONOSCOPY WITH PROPOFOL;  Surgeon: Manya Silvas, MD;  Location: [facility] ENDOSCOPY;  Service: Endoscopy;  Laterality: N/A;   CYST EXCISION Left    breast    Home Medications: Prior to Admission medications   Medication Sig Start Date End  Date Taking? Authorizing Provider  amLODipine (NORVASC) 5 MG tablet Take 1 tablet by mouth once daily 11/28/19  Yes [name], Jolene T, NP  aspirin EC 81 MG tablet Take 81 mg by mouth daily.   Yes [provider]  folic acid (FOLVITE) 1 MG tablet Take 1 tablet by mouth daily. 08/03/17  Yes [provider]  hydrochlorothiazide (HYDRODIURIL) 12.5 MG tablet hydrochlorothiazide 12.5 mg tablet   Yes [provider]  methotrexate (RHEUMATREX) 2.5 MG tablet Take 7 tabs (17.5 MG ) once a week for 12 weeks 02/02/18  Yes [provider]  Multiple Vitamin (MULTIVITAMIN) capsule Take 1 capsule by mouth daily.   Yes [provider]  rosuvastatin (CRESTOR) 20 MG tablet TAKE 1 TABLET BY MOUTH ON MONDAY, WEDNESDAY, AND FRIDAY AT 6PM 01/01/20  Yes [name], Jolene T, NP    Allergies: Allergies  Allergen Reactions   Prednisone Itching    Review of Systems: Review of Systems  Constitutional:  Negative for chills and fever.  Respiratory:  Negative for shortness of breath.   Cardiovascular:  Negative for chest pain.  Gastrointestinal:  Negative for nausea and vomiting.  Skin:  Negative for rash.   Physical Exam BP (!) 156/72   Pulse (!) 106   Temp 98.5 F (36.9 C) (Oral)   Ht 5\' 7"  (1.702 m)   Wt 227 lb (103 kg)   LMP  (LMP Unknown)   SpO2 97%   BMI 35.55 kg/m  CONSTITUTIONAL: No acute distress, well nourished. HEENT:  Normocephalic, atraumatic, extraocular motion intact. RESPIRATORY: Normal respiratory effort without pathologic use of accessory muscles. CARDIOVASCULAR:  Regular rhythm and rate BREAST:  Left breast without any palpable masses, skin changes, or nipple changes/drainage.  No left axillary lymphadenopathy.  Right breast without any palpable masses, skin changes, or nipple changes/drainage.  No right axillary lymphadenopathy. NEUROLOGIC:  Motor and sensation is grossly normal.  Cranial nerves are grossly intact. PSYCH:  Alert and oriented to  person, place and time. Affect is normal.  Labs/Imaging: Mammogram 01/29/21: FINDINGS: Three biopsy clips in the left breast denote previously biopsied benign intraductal papillomas which are stable to slightly decreased in size compared to prior mammograms. No new suspicious mass, calcification, or other findings in either breast.   IMPRESSION: No mammographic findings of malignancy.   RECOMMENDATION: Annual screening mammogram.   BI-RADS CATEGORY  2: Benign.  Assessment and Plan: This is a 70 y.o. female with intraductal papillomas of left breast.  --Patient's imaging is stable without any new findings.  Her exam is reassuring today as well.  Since her mammograms have been stable and her biopsies have only showed intraductal papillomas, discussed with her the option for deferring her future mammograms and breast exams to her PCP.  If there were to be any suspicious findings in the future, we can certainly see her right away and coordinate any procedures that are needed.  She's in agreement with this plan. --Follow up as needed.  Face-to-face time spent with the patient and care providers was 25 minutes, with more than 50% of the time spent counseling, educating, and coordinating care of the patient.     Melvyn Neth, [facility]

## 2021-02-23 DIAGNOSIS — Z23 Encounter for immunization: Secondary | ICD-10-CM | POA: Diagnosis not present

## 2021-03-11 DIAGNOSIS — Z79899 Other long term (current) drug therapy: Secondary | ICD-10-CM | POA: Diagnosis not present

## 2021-03-11 DIAGNOSIS — M059 Rheumatoid arthritis with rheumatoid factor, unspecified: Secondary | ICD-10-CM | POA: Diagnosis not present

## 2021-03-11 DIAGNOSIS — L409 Psoriasis, unspecified: Secondary | ICD-10-CM | POA: Diagnosis not present

## 2021-03-11 DIAGNOSIS — M170 Bilateral primary osteoarthritis of knee: Secondary | ICD-10-CM | POA: Diagnosis not present

## 2021-03-13 DIAGNOSIS — Z23 Encounter for immunization: Secondary | ICD-10-CM | POA: Diagnosis not present

## 2021-03-16 DIAGNOSIS — U071 COVID-19: Secondary | ICD-10-CM | POA: Diagnosis not present

## 2021-03-30 ENCOUNTER — Ambulatory Visit (INDEPENDENT_AMBULATORY_CARE_PROVIDER_SITE_OTHER): Payer: Medicare Other | Admitting: *Deleted

## 2021-03-30 DIAGNOSIS — Z0000 Encounter for general adult medical examination without abnormal findings: Secondary | ICD-10-CM

## 2021-03-30 NOTE — Progress Notes (Signed)
Subjective:   Angela Watts is a 70 y.o. female who presents for Medicare Annual (Subsequent) preventive examination.  I connected with  [name] on 03/30/21 by a telephone enabled telemedicine application and verified that I am speaking with the correct person using two identifiers.   I discussed the limitations of evaluation and management by telemedicine. The patient expressed understanding and agreed to proceed.  Patient location: home  Provider location:  [place]  not in office    Review of Systems     Cardiac Risk Factors include: advanced age (>59men, >85 women);hypertension;obesity (BMI >30kg/m2)     Objective:    Today's Vitals   There is no height or weight on file to calculate BMI.  Advanced Directives 03/28/2020 03/26/2019 07/14/2018 03/23/2018 03/21/2017 09/18/2016 07/27/2016  Does Patient Have a Medical Advance Directive? Yes Yes Yes No Yes No No  Type of Advance Directive Healthcare Power of Attorney Living will;Healthcare [place];Living will - -  Copy of [place] in Chart? No - copy requested No - copy requested - - No - copy requested - -  Would patient like information on creating a medical advance directive? - - - Yes (MAU/Ambulatory/Procedural Areas - Information given) - - No - Patient declined    Current Medications (verified) Outpatient Encounter Medications as of 03/30/2021  Medication Sig   amLODipine (NORVASC) 5 MG tablet Take 1 tablet (5 mg total) by mouth daily.   aspirin EC 81 MG tablet Take 81 mg by mouth daily.   folic acid (FOLVITE) 1 MG tablet Take 1 tablet by mouth daily.   methotrexate (RHEUMATREX) 2.5 MG tablet Take 7 tabs (17.5 MG ) once a week for 12 weeks   Multiple Vitamin (MULTIVITAMIN) capsule Take 1 capsule by mouth daily.   rosuvastatin (CRESTOR) 20 MG tablet TAKE 1 TABLET BY MOUTH ON MONDAY, WEDNESDAY, AND FRIDAY AT 6PM   No facility-administered  encounter medications on file as of 03/30/2021.    Allergies (verified) Prednisone   History: Past Medical History:  Diagnosis Date   Abnormal Pap smear of cervix    Abscess of bursa, left hip    Arthritis    Colon adenomas    GERD (gastroesophageal reflux disease)    Hyperlipidemia    Hypertension    Psoriasis    Rheumatoid arthritis flare ([place])    Past Surgical History:  Procedure Laterality Date   ABDOMINAL HYSTERECTOMY  2003   BREAST BIOPSY Left 06/01/2016   [place] bx done by dr byrnett 6:00 2cmfn intraducal papilloma with calcs   BREAST BIOPSY Left 02/05/2019   Affirm bx-"X" clip-benign parenchyma with fragments of sclerosed intraductal papilloma   BREAST BIOPSY Left 2012   [place] bx done by Dr. Bary Castilla, intraductal papilloma   BREAST CYST ASPIRATION Left 2011   NEG/ Br Byrnett   BREAST CYST ASPIRATION Left 06/01/2016   dr byrnett 5:00 3cmfn aspirated with complete resolution   COLONOSCOPY WITH PROPOFOL N/A 06/11/2015   Procedure: COLONOSCOPY WITH PROPOFOL;  Surgeon: Manya Silvas, MD;  Location: [facility] ENDOSCOPY;  Service: Endoscopy;  Laterality: N/A;   COLONOSCOPY WITH PROPOFOL N/A 07/14/2018   Procedure: COLONOSCOPY WITH PROPOFOL;  Surgeon: Manya Silvas, MD;  Location[facility] ENDOSCOPY;  Service: Endoscopy;  Laterality: N/A;   CYST EXCISION Left    breast   Family History  Problem Relation Age of Onset   Hypertension Mother    Arthritis Mother  Breast cancer Mother        18   Cancer Mother        breast   Hypertension Father    Lung cancer Father    Diabetes Sister    Hypertension Sister    Hypertension Sister    Hypertension Sister    Hypertension Sister    Hypertension Brother    Hypertension Brother    Cancer Brother        lung   Social History   Socioeconomic History   Marital status: Single    Spouse name: Not on file   Number of children: Not on file   Years of education: Not on file   Highest education level: Not on file  Occupational  History   Not on file  Tobacco Use   Smoking status: Never   Smokeless tobacco: Never  Vaping Use   Vaping Use: Never used  Substance and Sexual Activity   Alcohol use: Yes    Alcohol/week: 0.0 standard drinks    Comment: on occasion/socially   Drug use: No   Sexual activity: Never  Other Topics Concern   Not on file  Social History Narrative   Not on file   Social Determinants of Health   Financial Resource Strain: Low Risk    Difficulty of Paying Living Expenses: Not hard at all  Food Insecurity: No Food Insecurity   Worried About Charity fundraiser in the Last Year: Never true   [place] in the Last Year: Never true  Transportation Needs: No Transportation Needs   Lack of Transportation (Medical): No   Lack of Transportation (Non-Medical): No  Physical Activity: Insufficiently Active   Days of Exercise per Week: 3 days   Minutes of Exercise per Session: 30 min  Stress: No Stress Concern Present   Feeling of Stress : Not at all  Social Connections: Unknown   Frequency of Communication with Friends and Family: More than three times a week   Frequency of Social Gatherings with Friends and Family: More than three times a week   Attends Religious Services: More than 4 times per year   Active Member of [employer] or Organizations: Yes   Attends Archivist Meetings: 1 to 4 times per year   Marital Status: Not on file    Tobacco Counseling Counseling given: Not Answered   Clinical Intake:  Pre-visit preparation completed: Yes  Pain : No/denies pain     Nutritional Risks: None Diabetes: No  How often do you need to have someone help you when you read instructions, pamphlets, or other written materials from your doctor or pharmacy?: 1 - Never  Diabetic?  no  Interpreter Needed?: No  Information entered by :: Leroy Kennedy LPN   Activities of Daily Living In your present state of health, do you have any difficulty performing the following  activities: 03/30/2021  Hearing? N  Vision? N  Difficulty concentrating or making decisions? N  Walking or climbing stairs? N  Dressing or bathing? N  Doing errands, shopping? N  Preparing Food and eating ? N  Using the Toilet? N  In the past six months, have you accidently leaked urine? Y  Do you have problems with loss of bowel control? N  Managing your Medications? N  Managing your Finances? N  Housekeeping or managing your Housekeeping? N  Some recent data might be hidden    Patient Care Team: Venita Lick, NP as PCP - General (Nurse  Practitioner) Valerie Roys, DO as Referring Physician (Family Medicine) Bary Castilla Forest Gleason, MD (General Surgery) Marlowe Sax, MD as Referring Physician (Internal Medicine)  Indicate any recent Medical Services you may have received from other than [facility] providers in the past year (date may be approximate).     Assessment:   This is a routine wellness examination for [place].  Hearing/Vision screen Hearing Screening - Comments:: No trouble hearing Vision Screening - Comments:: Up to date  [facility]  Dietary issues and exercise activities discussed: Current Exercise Habits: Home exercise routine, Type of exercise: walking, Time (Minutes): 30, Frequency (Times/Week): 4, Weekly Exercise (Minutes/Week): 120, Intensity: Mild   Goals Addressed             This Visit's Progress    Weight (lb) < 200 lb (90.7 kg)         Depression Screen PHQ 2/9 Scores 03/30/2021 05/06/2020 03/28/2020 05/04/2019 03/26/2019 03/23/2018 03/21/2017  PHQ - 2 Score 0 1 0 0 0 1 1  PHQ- 9 Score - 3 - - - - 3    Fall Risk Fall Risk  03/30/2021 02/16/2021 05/06/2020 03/28/2020 02/04/2020  Falls in the past year? 0 0 0 0 0  Comment - - - - -  Number falls in past yr: 0 - 0 - 0  Injury with Fall? 0 - 0 - 0  Risk for fall due to : - - - Medication side effect -  Follow up - - - Falls evaluation completed;Education provided;Falls  prevention discussed -    FALL RISK PREVENTION PERTAINING TO THE HOME:  Any stairs in or around the home? No  If so, are there any without handrails? No  Home free of loose throw rugs in walkways, pet beds, electrical cords, etc? Yes  Adequate lighting in your home to reduce risk of falls? Yes   ASSISTIVE DEVICES UTILIZED TO PREVENT FALLS:  Life alert? No  Use of a cane, walker or w/c? No  Grab bars in the bathroom? No  Shower chair or bench in shower? No  Elevated toilet seat or a handicapped toilet? No   TIMED UP AND GO:  Was the test performed? No .    Cognitive Function:  Normal cognitive status assessed by direct observation by this Nurse Health Advisor. No abnormalities found.       6CIT Screen 03/28/2020 03/26/2019 03/23/2018 03/21/2017  What Year? 0 points 0 points 0 points 0 points  What month? 0 points 0 points 0 points 0 points  What time? 0 points 0 points 0 points 0 points  Count back from 20 2 points 0 points 0 points 0 points  Months in reverse 0 points 0 points 0 points 0 points  Repeat phrase 0 points 0 points 4 points 0 points  Total Score 2 0 4 0    Immunizations Immunization History  Administered Date(s) Administered   Influenza, High Dose Seasonal PF 03/19/2016, 03/21/2017, 03/23/2018, 02/15/2019   Influenza,inj,Quad PF,6+ Mos 03/18/2015   Influenza-Unspecified 02/12/2020   PFIZER(Purple Top)SARS-COV-2 Vaccination 07/23/2019, 08/13/2019, 03/13/2020   Pneumococcal Conjugate-13 02/11/2014   Pneumococcal Polysaccharide-23 02/03/2010, 03/18/2015   Td 03/19/2004   Tdap 03/18/2015   Zoster, Live 02/20/2013    TDAP status: Up to date  Flu Vaccine status: Due, Education has been provided regarding the importance of this vaccine. Advised may receive this vaccine at local pharmacy or Health Dept. Aware to provide a copy of the vaccination record if obtained from local pharmacy or Health Dept. Verbalized  acceptance and understanding.  Pneumococcal  vaccine status: Up to date  Covid-19 vaccine status: Information provided on how to obtain vaccines.   Qualifies for Shingles Vaccine? Yes   Zostavax completed Yes   Shingrix Completed?: No.    Education has been provided regarding the importance of this vaccine. Patient has been advised to call insurance company to determine out of pocket expense if they have not yet received this vaccine. Advised may also receive vaccine at local pharmacy or Health Dept. Verbalized acceptance and understanding.  Screening Tests Health Maintenance  Topic Date Due   Zoster Vaccines- Shingrix (1 of 2) Never done   COVID-19 Vaccine (4 - Booster for Pfizer series) 05/08/2020   INFLUENZA VACCINE  12/15/2020   MAMMOGRAM  01/30/2023   TETANUS/TDAP  03/17/2025   COLONOSCOPY (Pts 45-11yrs Insurance coverage will need to be confirmed)  07/14/2028   Pneumonia Vaccine 10+ Years old  Completed   DEXA SCAN  Completed   Hepatitis C Screening  Completed   HPV VACCINES  Aged Out    Health Maintenance  Health Maintenance Due  Topic Date Due   Zoster Vaccines- Shingrix (1 of 2) Never done   COVID-19 Vaccine (4 - Booster for Pfizer series) 05/08/2020   INFLUENZA VACCINE  12/15/2020    Colorectal cancer screening: Type of screening: Colonoscopy. Completed 2020. Repeat every 10 years  Mammogram status: Completed  . Repeat every year  Bone Density status: Completed  . Results reflect: Bone density results: NORMAL. Repeat every 0 years.  Lung Cancer Screening: (Low Dose CT Chest recommended if Age 71-80 years, 30 pack-year currently smoking OR have quit w/in 15years.) does not qualify.   Lung Cancer Screening Referral:   Additional Screening:  Hepatitis C Screening: does not qualify; Completed 2016  Vision Screening: Recommended annual ophthalmology exams for early detection of glaucoma and other disorders of the eye. Is the patient up to date with their annual eye exam?  Yes  Who is the provider or what is  the name of the office in which the patient attends annual eye exams? [facility] If pt is not established with a provider, would they like to be referred to a provider to establish care? No .   Dental Screening: Recommended annual dental exams for proper oral hygiene  Community Resource Referral / Chronic Care Management: CRR required this visit?  No   CCM required this visit?  No      Plan:     I have personally reviewed and noted the following in the patient's chart:   Medical and social history Use of alcohol, tobacco or illicit drugs  Current medications and supplements including opioid prescriptions.  Functional ability and status Nutritional status Physical activity Advanced directives List of other physicians Hospitalizations, surgeries, and ER visits in previous 12 months Vitals Screenings to include cognitive, depression, and falls Referrals and appointments  In addition, I have reviewed and discussed with patient certain preventive protocols, quality metrics, and best practice recommendations. A written personalized care plan for preventive services as well as general preventive health recommendations were provided to patient.     Leroy Kennedy, LPN   [id number]   Nurse Notes:

## 2021-03-30 NOTE — Patient Instructions (Signed)
Angela Watts , Thank you for taking time to come for your Medicare Wellness Visit. I appreciate your ongoing commitment to your health goals. Please review the following plan we discussed and let me know if I can assist you in the future.   Screening recommendations/referrals: Colonoscopy: up to date Mammogram: up to date Bone Density: up to date Recommended yearly ophthalmology/optometry visit for glaucoma screening and checkup Recommended yearly dental visit for hygiene and checkup  Vaccinations: Influenza vaccine: Education provided Pneumococcal vaccine: up to date Tdap vaccine: up to date Shingles vaccine: Education provided    Advanced directives: Education provided  Conditions/risks identified:   Next appointment: 05-06-2021 @ 9:00 Angela Watts   Preventive Care 72 Years and Older, Female Preventive care refers to lifestyle choices and visits with your health care provider that can promote health and wellness. What does preventive care include? A yearly physical exam. This is also called an annual well check. Dental exams once or twice a year. Routine eye exams. Ask your health care provider how often you should have your eyes checked. Personal lifestyle choices, including: Daily care of your teeth and gums. Regular physical activity. Eating a healthy diet. Avoiding tobacco and drug use. Limiting alcohol use. Practicing safe sex. Taking low-dose aspirin every day. Taking vitamin and mineral supplements as recommended by your health care provider. What happens during an annual well check? The services and screenings done by your health care provider during your annual well check will depend on your age, overall health, lifestyle risk factors, and family history of disease. Counseling  Your health care provider may ask you questions about your: Alcohol use. Tobacco use. Drug use. Emotional well-being. Home and relationship well-being. Sexual activity. Eating  habits. History of falls. Memory and ability to understand (cognition). Work and work Statistician. Reproductive health. Screening  You may have the following tests or measurements: Height, weight, and BMI. Blood pressure. Lipid and cholesterol levels. These may be checked every 5 years, or more frequently if you are over 40 years old. Skin check. Lung cancer screening. You may have this screening every year starting at age 72 if you have a 30-pack-year history of smoking and currently smoke or have quit within the past 15 years. Fecal occult blood test (FOBT) of the stool. You may have this test every year starting at age 72. Flexible sigmoidoscopy or colonoscopy. You may have a sigmoidoscopy every 5 years or a colonoscopy every 10 years starting at age 72. Hepatitis C blood test. Hepatitis B blood test. Sexually transmitted disease (STD) testing. Diabetes screening. This is done by checking your blood sugar (glucose) after you have not eaten for a while (fasting). You may have this done every 1-3 years. Bone density scan. This is done to screen for osteoporosis. You may have this done starting at age 72. Mammogram. This may be done every 1-2 years. Talk to your health care provider about how often you should have regular mammograms. Talk with your health care provider about your test results, treatment options, and if necessary, the need for more tests. Vaccines  Your health care provider may recommend certain vaccines, such as: Influenza vaccine. This is recommended every year. Tetanus, diphtheria, and acellular pertussis (Tdap, Td) vaccine. You may need a Td booster every 10 years. Zoster vaccine. You may need this after age 72. Pneumococcal 13-valent conjugate (PCV13) vaccine. One dose is recommended after age 72. Pneumococcal polysaccharide (PPSV23) vaccine. One dose is recommended after age 7. Talk to your health care provider about  which screenings and vaccines you need and how  often you need them. This information is not intended to replace advice given to you by your health care provider. Make sure you discuss any questions you have with your health care provider. Document Released: 05/30/2015 Document Revised: 01/21/2016 Document Reviewed: 03/04/2015 Elsevier Interactive Patient Education  2017 [place] Prevention in the Home Falls can cause injuries. They can happen to people of all ages. There are many things you can do to make your home safe and to help prevent falls. What can I do on the outside of my home? Regularly fix the edges of walkways and driveways and fix any cracks. Remove anything that might make you trip as you walk through a door, such as a raised step or threshold. Trim any bushes or trees on the path to your home. Use bright outdoor lighting. Clear any walking paths of anything that might make someone trip, such as rocks or tools. Regularly check to see if handrails are loose or broken. Make sure that both sides of any steps have handrails. Any raised decks and porches should have guardrails on the edges. Have any leaves, snow, or ice cleared regularly. Use sand or salt on walking paths during winter. Clean up any spills in your garage right away. This includes oil or grease spills. What can I do in the bathroom? Use night lights. Install grab bars by the toilet and in the tub and shower. Do not use towel bars as grab bars. Use non-skid mats or decals in the tub or shower. If you need to sit down in the shower, use a plastic, non-slip stool. Keep the floor dry. Clean up any water that spills on the floor as soon as it happens. Remove soap buildup in the tub or shower regularly. Attach bath mats securely with double-sided non-slip rug tape. Do not have throw rugs and other things on the floor that can make you trip. What can I do in the bedroom? Use night lights. Make sure that you have a light by your bed that is easy to  reach. Do not use any sheets or blankets that are too big for your bed. They should not hang down onto the floor. Have a firm chair that has side arms. You can use this for support while you get dressed. Do not have throw rugs and other things on the floor that can make you trip. What can I do in the kitchen? Clean up any spills right away. Avoid walking on wet floors. Keep items that you use a lot in easy-to-reach places. If you need to reach something above you, use a strong step stool that has a grab bar. Keep electrical cords out of the way. Do not use floor polish or wax that makes floors slippery. If you must use wax, use non-skid floor wax. Do not have throw rugs and other things on the floor that can make you trip. What can I do with my stairs? Do not leave any items on the stairs. Make sure that there are handrails on both sides of the stairs and use them. Fix handrails that are broken or loose. Make sure that handrails are as long as the stairways. Check any carpeting to make sure that it is firmly attached to the stairs. Fix any carpet that is loose or worn. Avoid having throw rugs at the top or bottom of the stairs. If you do have throw rugs, attach them to the floor with  carpet tape. Make sure that you have a light switch at the top of the stairs and the bottom of the stairs. If you do not have them, ask someone to add them for you. What else can I do to help prevent falls? Wear shoes that: Do not have high heels. Have rubber bottoms. Are comfortable and fit you well. Are closed at the toe. Do not wear sandals. If you use a stepladder: Make sure that it is fully opened. Do not climb a closed stepladder. Make sure that both sides of the stepladder are locked into place. Ask someone to hold it for you, if possible. Clearly mark and make sure that you can see: Any grab bars or handrails. First and last steps. Where the edge of each step is. Use tools that help you move  around (mobility aids) if they are needed. These include: Canes. Walkers. Scooters. Crutches. Turn on the lights when you go into a dark area. Replace any light bulbs as soon as they burn out. Set up your furniture so you have a clear path. Avoid moving your furniture around. If any of your floors are uneven, fix them. If there are any pets around you, be aware of where they are. Review your medicines with your doctor. Some medicines can make you feel dizzy. This can increase your chance of falling. Ask your doctor what other things that you can do to help prevent falls. This information is not intended to replace advice given to you by your health care provider. Make sure you discuss any questions you have with your health care provider. Document Released: 02/27/2009 Document Revised: 10/09/2015 Document Reviewed: 06/07/2014 Elsevier Interactive Patient Education  2017 [employer].

## 2021-05-06 ENCOUNTER — Other Ambulatory Visit: Payer: Self-pay

## 2021-05-06 ENCOUNTER — Ambulatory Visit (INDEPENDENT_AMBULATORY_CARE_PROVIDER_SITE_OTHER): Payer: Medicare Other | Admitting: Nurse Practitioner

## 2021-05-06 ENCOUNTER — Encounter: Payer: Self-pay | Admitting: Nurse Practitioner

## 2021-05-06 VITALS — BP 124/70 | HR 82 | Temp 98.2°F | Ht 67.0 in | Wt 224.4 lb

## 2021-05-06 DIAGNOSIS — M059 Rheumatoid arthritis with rheumatoid factor, unspecified: Secondary | ICD-10-CM | POA: Diagnosis not present

## 2021-05-06 DIAGNOSIS — R748 Abnormal levels of other serum enzymes: Secondary | ICD-10-CM

## 2021-05-06 DIAGNOSIS — Z6835 Body mass index (BMI) 35.0-35.9, adult: Secondary | ICD-10-CM

## 2021-05-06 DIAGNOSIS — I10 Essential (primary) hypertension: Secondary | ICD-10-CM | POA: Diagnosis not present

## 2021-05-06 DIAGNOSIS — M170 Bilateral primary osteoarthritis of knee: Secondary | ICD-10-CM | POA: Diagnosis not present

## 2021-05-06 DIAGNOSIS — E7800 Pure hypercholesterolemia, unspecified: Secondary | ICD-10-CM | POA: Diagnosis not present

## 2021-05-06 DIAGNOSIS — Z79631 Long term (current) use of antimetabolite agent: Secondary | ICD-10-CM | POA: Diagnosis not present

## 2021-05-06 NOTE — Progress Notes (Signed)
BP 124/70    Pulse 82    Temp 98.2 F (36.8 C) (Oral)    Ht 5' 7" (1.702 m)    Wt 224 lb 6.4 oz (101.8 kg)    LMP  (LMP Unknown)    SpO2 99%    BMI 35.15 kg/m    Subjective:    Patient ID: Angela Watts, female    DOB: November 03, 1950, 70 y.o.   MRN: [id number]  HPI: Angela Watts is a 70 y.o. female  Chief Complaint  Patient presents with   Hyperlipidemia   Hypertension   Knee Pain    Patient states she having some pain in her R knee. Patient states it has been an outgoing issue.    HYPERTENSION / HYPERLIPIDEMIA Continues on ASA, Amlodipine, + Crestor.     Has history of mild elevation ALK PHOS, ongoing, last 156.  Denies any pain or N&V.  Still has gall bladder. Satisfied with current treatment? yes Duration of hypertension: chronic BP monitoring frequency: 1-2 times a month BP range: 120/70 range BP medication side effects: no Duration of hyperlipidemia: chronic Cholesterol medication side effects: no Cholesterol supplements: none Medication compliance: good compliance Aspirin: yes Recent stressors: no Recurrent headaches: no Visual changes: no Palpitations: no Dyspnea: no Chest pain: no Lower extremity edema: no Dizzy/lightheaded: no    RHEUMATOID ARTHRITIS & KNEE PAIN: Followed by Dr. Posey Pronto and last seen 03/11/21.  She is to continue Methotrexate 7 tablets once a week and daily folic acid.  She reports this does help with pain.    Has been seen by ortho for knee pain, last in July 2022 -- did steroid injections both knees at that time.  Pain did improve with injections.  Reported to have arthritis in knees.  She reports pain is ongoing.  Pain is not present every day, but  nagging pain.  DEXA in 2017 was normal.  The more she walks on in the more it improves. Duration: chronic Involved knee: bilateral R>L Mechanism of injury: unknown Location:medial Onset: gradual Severity: 6/10 at worst Quality:  dull and aching Frequency: intermittent Radiation:  no Aggravating factors: movement  Alleviating factors:  injections, APAP, and rest  Status: fluctuating Treatments attempted: rest and APAP  Relief with NSAIDs?:  No NSAIDs Taken Weakness with weight bearing or walking: no Sensation of giving way: no Locking: yes Popping: no Bruising: no Swelling: yes Redness: no Paresthesias/decreased sensation: no Fevers: no   Relevant past medical, surgical, family and social history reviewed and updated as indicated. Interim medical history since our last visit reviewed. Allergies and medications reviewed and updated.  Review of Systems  Constitutional:  Negative for activity change, appetite change, diaphoresis, fatigue and fever.  Respiratory:  Negative for cough, chest tightness, shortness of breath and wheezing.   Cardiovascular:  Negative for chest pain, palpitations and leg swelling.  Gastrointestinal: Negative.   Neurological: Negative.   Psychiatric/Behavioral: Negative.     Per HPI unless specifically indicated above     Objective:    BP 124/70    Pulse 82    Temp 98.2 F (36.8 C) (Oral)    Ht 5' 7" (1.702 m)    Wt 224 lb 6.4 oz (101.8 kg)    LMP  (LMP Unknown)    SpO2 99%    BMI 35.15 kg/m   Wt Readings from Last 3 Encounters:  05/06/21 224 lb 6.4 oz (101.8 kg)  02/16/21 227 lb (103 kg)  11/04/20 225 lb 3.2 oz (102.2  kg)    Physical Exam Vitals and nursing note reviewed.  Constitutional:      General: She is awake. She is not in acute distress.    Appearance: She is well-developed and well-groomed. She is obese. She is not ill-appearing.  HENT:     Head: Normocephalic.     Right Ear: Hearing normal. No drainage.     Left Ear: Hearing normal. No drainage.  Eyes:     General: Lids are normal.        Right eye: No discharge.        Left eye: No discharge.     Conjunctiva/sclera: Conjunctivae normal.  Neck:     Thyroid: No thyromegaly.     Vascular: No carotid bruit.  Cardiovascular:     Rate and Rhythm: Normal rate  and regular rhythm.     Heart sounds: Normal heart sounds. No murmur heard.   No gallop.  Pulmonary:     Effort: Pulmonary effort is normal. No accessory muscle usage or respiratory distress.     Breath sounds: Normal breath sounds.  Abdominal:     General: Bowel sounds are normal.     Palpations: Abdomen is soft.  Musculoskeletal:     Cervical back: Normal range of motion.     Right lower leg: No edema.     Left lower leg: No edema.  Neurological:     Mental Status: She is alert and oriented to person, place, and time.  Psychiatric:        Attention and Perception: Attention normal.        Mood and Affect: Mood normal.        Speech: Speech normal.        Behavior: Behavior normal. Behavior is cooperative.        Thought Content: Thought content normal.        Judgment: Judgment normal.   Results for orders placed or performed in visit on 12/04/20  CBC with Differential/Platelet  Result Value Ref Range   WBC 6.0 3.4 - 10.8 x10E3/uL   RBC 4.42 3.77 - 5.28 x10E6/uL   Hemoglobin 13.4 11.1 - 15.9 g/dL   Hematocrit 40.9 34.0 - 46.6 %   MCV 93 79 - 97 fL   MCH 30.3 26.6 - 33.0 pg   MCHC 32.8 31.5 - 35.7 g/dL   RDW 14.6 11.7 - 15.4 %   Platelets 225 150 - 450 x10E3/uL   Neutrophils 51 Not Estab. %   Lymphs 36 Not Estab. %   Monocytes 11 Not Estab. %   Eos 1 Not Estab. %   Basos 1 Not Estab. %   Neutrophils Absolute 3.1 1.4 - 7.0 x10E3/uL   Lymphocytes Absolute 2.2 0.7 - 3.1 x10E3/uL   Monocytes Absolute 0.7 0.1 - 0.9 x10E3/uL   EOS (ABSOLUTE) 0.1 0.0 - 0.4 x10E3/uL   Basophils Absolute 0.0 0.0 - 0.2 x10E3/uL   Immature Granulocytes 0 Not Estab. %   Immature Grans (Abs) 0.0 0.0 - 0.1 x10E3/uL      Assessment & Plan:   Problem List Items Addressed This Visit       Cardiovascular and Mediastinum   Hypertension    Chronic, stable.  BP at goal at home and in office on check.  Will contiue Amlodipine 5 MG daily and if elevations noted in upcoming months will increase to 10  MG.  Remain off HCTZ due to complaint urinary urgency with this in past.  CMP today.  Focus on DASH diet  and regular exercise at home.  Monitor BP daily.  Return in 6 months for follow-up.      Relevant Orders   Comprehensive metabolic panel     Musculoskeletal and Integument   Osteoarthritis    Ongoing to knees, will continue collaboration with ortho and recommend she return to see them as needed.      Rheumatoid arthritis, seropositive (HCC) - Primary    Chronic, ongoing.  Continue collaboration with rheumatology + current medication regimen.  Check CMP today.        Other   Elevated alkaline phosphatase level    Recheck on labs today and if consistent trend upwards obtain u/s and refer to GI.      Relevant Orders   Gamma GT   Hyperlipidemia    Chronic, ongoing.  Continue current medication regimen and adjust as needed.  Lipid panel today.      Relevant Orders   Comprehensive metabolic panel   Lipid Panel w/o Chol/HDL Ratio   Long term methotrexate user    Check CMP today, CBC up to date with rheumatology.      Obesity    BMI 35.15.  Recommended eating smaller high protein, low fat meals more frequently and exercising 30 mins a day 5 times a week with a goal of 10-15lb weight loss in the next 3 months. Patient voiced their understanding and motivation to adhere to these recommendations.         Follow up plan: Return in about 6 months (around 11/04/2021) for HTN/HLD, RA, OA.

## 2021-05-06 NOTE — Assessment & Plan Note (Addendum)
Chronic, stable.  BP at goal at home and in office on check.  Will contiue Amlodipine 5 MG daily and if elevations noted in upcoming months will increase to 10 MG.  Remain off HCTZ due to complaint urinary urgency with this in past.  CMP today.  Focus on DASH diet and regular exercise at home.  Monitor BP daily.  Return in 6 months for follow-up.

## 2021-05-06 NOTE — Assessment & Plan Note (Signed)
BMI 35.15.  Recommended eating smaller high protein, low fat meals more frequently and exercising 30 mins a day 5 times a week with a goal of 10-15lb weight loss in the next 3 months. Patient voiced their understanding and motivation to adhere to these recommendations.

## 2021-05-06 NOTE — Patient Instructions (Signed)

## 2021-05-06 NOTE — Assessment & Plan Note (Signed)
Recheck on labs today and if consistent trend upwards obtain u/s and refer to GI. 

## 2021-05-06 NOTE — Assessment & Plan Note (Signed)
Chronic, ongoing.  Continue current medication regimen and adjust as needed. Lipid panel today. 

## 2021-05-06 NOTE — Assessment & Plan Note (Signed)
Ongoing to knees, will continue collaboration with ortho and recommend she return to see them as needed.

## 2021-05-06 NOTE — Assessment & Plan Note (Signed)
Chronic, ongoing.  Continue collaboration with rheumatology + current medication regimen.  Check CMP today.

## 2021-05-06 NOTE — Assessment & Plan Note (Signed)
Check CMP today, CBC up to date with rheumatology. 

## 2021-05-07 LAB — COMPREHENSIVE METABOLIC PANEL
ALT: 15 IU/L (ref 0–32)
AST: 17 IU/L (ref 0–40)
Albumin/Globulin Ratio: 1.6 (ref 1.2–2.2)
Albumin: 4.2 g/dL (ref 3.7–4.7)
Alkaline Phosphatase: 161 IU/L — ABNORMAL HIGH (ref 44–121)
BUN/Creatinine Ratio: 12 (ref 12–28)
BUN: 10 mg/dL (ref 8–27)
Bilirubin Total: 0.3 mg/dL (ref 0.0–1.2)
CO2: 22 mmol/L (ref 20–29)
Calcium: 8.9 mg/dL (ref 8.7–10.3)
Chloride: 107 mmol/L — ABNORMAL HIGH (ref 96–106)
Creatinine, Ser: 0.86 mg/dL (ref 0.57–1.00)
Globulin, Total: 2.7 g/dL (ref 1.5–4.5)
Glucose: 102 mg/dL — ABNORMAL HIGH (ref 70–99)
Potassium: 4.1 mmol/L (ref 3.5–5.2)
Sodium: 144 mmol/L (ref 134–144)
Total Protein: 6.9 g/dL (ref 6.0–8.5)
eGFR: 72 mL/min/{1.73_m2} (ref >59)

## 2021-05-07 LAB — LIPID PANEL W/O CHOL/HDL RATIO
Cholesterol, Total: 160 mg/dL (ref 100–199)
HDL: 62 mg/dL (ref >39)
LDL Chol Calc (NIH): 88 mg/dL (ref 0–99)
Triglycerides: 44 mg/dL (ref 0–149)
VLDL Cholesterol Cal: 10 mg/dL (ref 5–40)

## 2021-05-07 LAB — GAMMA GT: GGT: 9 IU/L (ref 0–60)

## 2021-05-07 NOTE — Progress Notes (Signed)
Good morning crew, please let Meda know her labs have returned and overall remain stable and baseline for her.  No medication changes needed at this time.  Any questions?  Have a Merry Christmas!! Keep being wonderful!!  Thank you for allowing me to participate in your care.  I appreciate you. Kindest regards, Plato Alspaugh

## 2021-07-14 DIAGNOSIS — M170 Bilateral primary osteoarthritis of knee: Secondary | ICD-10-CM | POA: Diagnosis not present

## 2021-07-14 DIAGNOSIS — L409 Psoriasis, unspecified: Secondary | ICD-10-CM | POA: Diagnosis not present

## 2021-07-14 DIAGNOSIS — M059 Rheumatoid arthritis with rheumatoid factor, unspecified: Secondary | ICD-10-CM | POA: Diagnosis not present

## 2021-07-14 DIAGNOSIS — Z79899 Other long term (current) drug therapy: Secondary | ICD-10-CM | POA: Diagnosis not present

## 2021-07-27 ENCOUNTER — Other Ambulatory Visit: Payer: Self-pay | Admitting: Nurse Practitioner

## 2021-07-28 NOTE — Telephone Encounter (Signed)
Requested Prescriptions  ?Pending Prescriptions Disp Refills  ?? rosuvastatin (CRESTOR) 20 MG tablet [Pharmacy Med Name: ROSUVASTATIN CALCIUM 20 MG Tablet] 39 tablet 2  ?  Sig: TAKE 1 TABLET THREE TIMES WEEKLY ON MONDAY, WEDNESDAY, AND FRIDAY AT 6 PM  ?  ? Cardiovascular:  Antilipid - Statins 2 Failed - 07/27/2021 12:32 PM  ?  ?  Failed - Lipid Panel in normal range within the last 12 months  ?  Cholesterol, Total  ?Date Value Ref Range Status  ?05/06/2021 160 100 - 199 mg/dL Final  ? ?Cholesterol [place]o, [place]  ?Date Value Ref Range Status  ?11/02/2018 138 <200 mg/dL Final  ?  Comment:  ?                          Desirable                <200 ?                        Borderline High      200- 239 ?                        High                     >239 ?  ? ?LDL Chol Calc (NIH)  ?Date Value Ref Range Status  ?05/06/2021 88 0 - 99 mg/dL Final  ? ?HDL  ?Date Value Ref Range Status  ?05/06/2021 62 >39 mg/dL Final  ? ?Triglycerides  ?Date Value Ref Range Status  ?05/06/2021 44 0 - 149 mg/dL Final  ? ?Triglycerides Piccolo,Waived  ?Date Value Ref Range Status  ?11/02/2018 49 <150 mg/dL Final  ?  Comment:  ?                          Normal                   <150 ?                        Borderline High     150 - 199 ?                        High                200 - 499 ?                        Very High                >499 ?  ? ?  ?  ?  Passed - Cr in normal range and within 360 days  ?  Creatinine, Ser  ?Date Value Ref Range Status  ?05/06/2021 0.86 0.57 - 1.00 mg/dL Final  ?   ?  ?  Passed - Patient is not pregnant  ?  ?  Passed - Valid encounter within last 12 months  ?  Recent Outpatient Visits   ?      ? 2 months ago Rheumatoid arthritis, seropositive ([place])  ? [facility] [place], [place] T, NP  ? 8 months ago Class 2 severe obesity due to excess calories with serious comorbidity and body mass index (BMI) of 36.0 to 36.9 in adult [facility])  ?  [place], [place] T, NP  ? 1 year ago  Rheumatoid arthritis involving multiple sites with positive rheumatoid factor ([place])  ? [place], [place] T, NP  ? 1 year ago Rheumatoid arthritis involving multiple sites with positive rheumatoid factor ([place])  ? [place], [place] T, NP  ? 2 years ago Acute pain of right knee  ? [facility] [place], Henrine Screws T, NP  ?  ?  ?Future Appointments   ?        ? In 3 months [name], Barbaraann Faster, NP [facility]  ?  ? ?  ?  ?  ? ?

## 2021-08-31 ENCOUNTER — Other Ambulatory Visit: Payer: Self-pay | Admitting: Nurse Practitioner

## 2021-08-31 NOTE — Telephone Encounter (Signed)
Requested Prescriptions  ?Pending Prescriptions Disp Refills  ?? amLODipine (NORVASC) 5 MG tablet [Pharmacy Med Name: AMLODIPINE BESYLATE 5 MG Tablet] 90 tablet 0  ?  Sig: TAKE 1 TABLET EVERY DAY  ?  ? Cardiovascular: Calcium Channel Blockers 2 Passed - 08/31/2021 11:08 AM  ?  ?  Passed - Last BP in normal range  ?  BP Readings from Last 1 Encounters:  ?05/06/21 124/70  ?   ?  ?  Passed - Last Heart Rate in normal range  ?  Pulse Readings from Last 1 Encounters:  ?05/06/21 82  ?   ?  ?  Passed - Valid encounter within last 6 months  ?  Recent Outpatient Visits   ?      ? 3 months ago Rheumatoid arthritis, seropositive ([place])  ? [facility] [place], [place] T, NP  ? 10 months ago Class 2 severe obesity due to excess calories with serious comorbidity and body mass index (BMI) of 36.0 to 36.9 in adult [facility])  ? [place], [place] T, NP  ? 1 year ago Rheumatoid arthritis involving multiple sites with positive rheumatoid factor ([place])  ? [place], [place] T, NP  ? 1 year ago Rheumatoid arthritis involving multiple sites with positive rheumatoid factor ([place])  ? [place], Kimberly T, NP  ? 2 years ago Acute pain of right knee  ? [facility] [place], Henrine Screws T, NP  ?  ?  ?Future Appointments   ?        ? In 2 months [name], Barbaraann Faster, NP [employer] [facility]  ?  ? ?  ?  ?  ? ?

## 2021-10-31 NOTE — Patient Instructions (Signed)
Rheumatoid Arthritis Rheumatoid arthritis (RA) is a long-term (chronic) disease. RA causes inflammation in your joints. Your joints may feel painful, stiff, swollen, and warm. RA may start slowly. It most often affects the small joints of the hands and feet. It can also affect other parts of the body. Symptoms of RA often come and go. There is no cure for RA, but medicines can help your symptoms. What are the causes? RA is an autoimmune disease. This means that your body's defense system (immune system) attacks healthy parts of your body by mistake. The exact cause of RA is not known. What increases the risk? Being female. Having a family history of RA or other diseases like RA. Smoking. Being very overweight (obese). Being exposed to pollutants or chemicals. What are the signs or symptoms? Symptoms start slowly. They are often worse in the morning. The first symptom is often morning stiffness that lasts longer than 30 minutes. As RA gets worse, symptoms may include: Pain, stiffness, swelling, warmth, and tenderness in joints on both sides of your body. Loss of energy. Not wanting to eat as much as normal. Weight loss. A low fever. Dry eyes and a dry mouth. Firm lumps that grow under your skin. Changes in the way your joints look or the way they work. Symptoms vary and they often come and go. Symptoms sometimes get worse for a period of time. These are called flares. How is this treated? Treatment may include: Taking good care of yourself. Be sure to rest as needed, eat a healthy diet, and exercise. Medicines. These may include: Pain relievers. Medicines to help with inflammation. Disease-modifying antirheumatic drugs (DMARDs). Medicines called biologic response modifiers. Physical therapy and occupational therapy. Surgery, if joint damage is very bad. Your doctor will work with you to find the best treatments. Follow these instructions at home: Managing pain, stiffness, and  swelling If told, put heat on the affected area. Do this as often as told by your doctor. Use the heat source that your doctor recommends, such as a moist heat pack or a heating pad. Place a towel between your skin and the heat source. Leave the heat on for 20-30 minutes. Take off the heat if your skin turns bright red. This is very important. If you cannot feel pain, heat, or cold, you have a greater risk of getting burned.  Activity Return to your normal activities when your doctor says that it is safe. Rest when you have a flare. Exercise as told by your doctor. This can help your joints move better and get stronger. General instructions Take over-the-counter and prescription medicines only as told by your doctor. Keep all follow-up visits. Where to find more information American College of Rheumatology: rheumatology.org Arthritis Foundation: arthritis.org Contact a doctor if: You have a flare. You have a fever. You have problems because of your medicines. Get help right away if: You have chest pain. You have trouble breathing. You get a hot, painful joint all of a sudden, and it is worse than your normal joint aches. These symptoms may be an emergency. Get help right away. Call 911. Do not wait to see if the symptoms will go away. Do not drive yourself to the hospital. Summary RA is a long-term disease. RA causes inflammation in your joints. Symptoms of RA start slowly. They are often worse in the morning. This information is not intended to replace advice given to you by your health care provider. Make sure you discuss any questions you have with   your health care provider. Document Revised: 03/05/2021 Document Reviewed: 03/05/2021 Elsevier Patient Education  2023 Elsevier Inc.  

## 2021-11-04 ENCOUNTER — Encounter: Payer: Self-pay | Admitting: Nurse Practitioner

## 2021-11-04 ENCOUNTER — Ambulatory Visit (INDEPENDENT_AMBULATORY_CARE_PROVIDER_SITE_OTHER): Payer: Medicare Other | Admitting: Nurse Practitioner

## 2021-11-04 VITALS — BP 134/74 | HR 79 | Temp 98.2°F | Wt 224.0 lb

## 2021-11-04 DIAGNOSIS — M170 Bilateral primary osteoarthritis of knee: Secondary | ICD-10-CM | POA: Diagnosis not present

## 2021-11-04 DIAGNOSIS — Z79631 Long term (current) use of antimetabolite agent: Secondary | ICD-10-CM

## 2021-11-04 DIAGNOSIS — I10 Essential (primary) hypertension: Secondary | ICD-10-CM | POA: Diagnosis not present

## 2021-11-04 DIAGNOSIS — Z6835 Body mass index (BMI) 35.0-35.9, adult: Secondary | ICD-10-CM

## 2021-11-04 DIAGNOSIS — M059 Rheumatoid arthritis with rheumatoid factor, unspecified: Secondary | ICD-10-CM

## 2021-11-04 DIAGNOSIS — R748 Abnormal levels of other serum enzymes: Secondary | ICD-10-CM

## 2021-11-04 DIAGNOSIS — E7800 Pure hypercholesterolemia, unspecified: Secondary | ICD-10-CM

## 2021-11-04 MED ORDER — AMLODIPINE BESYLATE 5 MG PO TABS: 5.0000 mg | ORAL_TABLET | Freq: Every day | ORAL | 4 refills | Status: DC

## 2021-11-04 MED ORDER — ROSUVASTATIN CALCIUM 20 MG PO TABS: ORAL_TABLET | ORAL | 4 refills | Status: DC

## 2021-11-04 NOTE — Assessment & Plan Note (Signed)
Chronic, stable.  BP at goal at home and in office on check.  Will contiue Amlodipine 5 MG daily and if elevations noted in upcoming months will increase to 10 MG. Focus on DASH diet and regular exercise at home.  Monitor BP daily.  Return in 6 months for follow-up.

## 2021-11-04 NOTE — Assessment & Plan Note (Signed)
Chronic, ongoing.  Continue current medication regimen and adjust as needed. Lipid panel today. 

## 2021-11-04 NOTE — Assessment & Plan Note (Signed)
BMI 35.08.  Recommended eating smaller high protein, low fat meals more frequently and exercising 30 mins a day 5 times a week with a goal of 10-15lb weight loss in the next 3 months. Patient voiced their understanding and motivation to adhere to these recommendations.

## 2021-11-04 NOTE — Assessment & Plan Note (Addendum)
Previously elevated on labs. Check gamma GT today. Consider GI referral in the future if needed.

## 2021-11-04 NOTE — Assessment & Plan Note (Signed)
Chronic, ongoing.  Continue collaboration with rheumatology + current medication regimen.  Check CMP today.

## 2021-11-04 NOTE — Assessment & Plan Note (Signed)
Check CMP today. Continue collaboration with rheumatology.

## 2021-11-04 NOTE — Assessment & Plan Note (Signed)
Ongoing to knees, will continue collaboration with ortho and recommend she return to see them as needed.

## 2021-11-04 NOTE — Progress Notes (Signed)
BP 134/74   Pulse 79   Temp 98.2 F (36.8 C) (Oral)   Wt 224 lb (101.6 kg)   LMP  (LMP Unknown)   SpO2 95%   BMI 35.08 kg/m    Subjective:    Patient ID: Angela Watts, female    DOB: [birth year], 71 y.o.   MRN: [id number]  HPI: Angela Watts is a 71 y.o. female  Chief Complaint  Patient presents with   Hyperlipidemia   Hypertension   Rheumatoid Arthritis   NOTE WRITTEN BY FNP STUDENT.  ASSESSMENT AND PLAN OF CARE REVIEWED WITH STUDENT, AGREE WITH ABOVE FINDINGS AND PLAN.   HYPERTENSION / HYPERLIPIDEMIA Continues Amlodipine, + Crestor.  No longer taking ASA.     Has history of mild elevation ALK PHOS, ongoing, last 161.  Denies any pain or N&V.  Still has gall bladder. Satisfied with current treatment? yes Duration of hypertension: chronic BP monitoring frequency: a few times a month BP range: 130-140/80 BP medication side effects: no Duration of hyperlipidemia: chronic Cholesterol medication side effects: no Cholesterol supplements: none Medication compliance: excellent compliance Aspirin: no Recent stressors: no Recurrent headaches: no Visual changes: no Palpitations: no Dyspnea: with exertion Chest pain: no Lower extremity edema: no Dizzy/lightheaded: no   RHEUMATOID ARTHRITIS Followed by Dr. Posey Pronto and last seen 07/14/21.  She is to continue Methotrexate 7 tablets once a week and daily folic acid.  She reports this does help with pain.   Duration: months Pain: yes Symmetric: yes  mild Quality: aching and throbbing Frequency: intermittent Context:  stable Decreased function/range of motion: no Erythema: no Swelling: no Heat or warmth: no Morning stiffness: yes  Aggravating factors: None Alleviating factors: Improves with movement Relief with NSAIDs?: No NSAIDs Taken Treatments attempted:  none Involved Joints:     Hands: no     Wrists: yes bilateral     Elbows: no     Shoulders: yes bilateral    Back: yes    Hips: no     Knees: yes  bilateral    Ankles: no     Feet: no    Relevant past medical, surgical, family and social history reviewed and updated as indicated. Interim medical history since our last visit reviewed. Allergies and medications reviewed and updated.  Review of Systems  Constitutional:  Negative for chills, fatigue and fever.  HENT:  Negative for facial swelling, sinus pressure and sinus pain.   Eyes: Negative.   Respiratory:  Negative for chest tightness and shortness of breath.   Cardiovascular:  Negative for chest pain and leg swelling.  Gastrointestinal: Negative.   Endocrine: Negative for polydipsia, polyphagia and polyuria.  Genitourinary: Negative.   Musculoskeletal:  Positive for arthralgias.  Skin: Negative.   Allergic/Immunologic: Negative.   Neurological:  Negative for dizziness, weakness, numbness and headaches.  Hematological: Negative.   Psychiatric/Behavioral: Negative.      Per HPI unless specifically indicated above     Objective:    BP 134/74   Pulse 79   Temp 98.2 F (36.8 C) (Oral)   Wt 224 lb (101.6 kg)   LMP  (LMP Unknown)   SpO2 95%   BMI 35.08 kg/m   Wt Readings from Last 3 Encounters:  11/04/21 224 lb (101.6 kg)  05/06/21 224 lb 6.4 oz (101.8 kg)  02/16/21 227 lb (103 kg)    Physical Exam Vitals and nursing note reviewed.  Constitutional:      Appearance: Normal appearance. She is well-groomed.  HENT:  Head: Normocephalic.     Right Ear: Hearing normal.     Left Ear: Hearing normal.  Neck:     Thyroid: No thyromegaly or thyroid tenderness.     Vascular: No carotid bruit.  Cardiovascular:     Rate and Rhythm: Normal rate and regular rhythm.     Heart sounds: Normal heart sounds. No murmur heard. Pulmonary:     Effort: Pulmonary effort is normal.     Breath sounds: Normal breath sounds. No stridor.  Abdominal:     General: Bowel sounds are normal.     Palpations: Abdomen is soft.     Tenderness: There is no abdominal tenderness.   Musculoskeletal:     Cervical back: Normal range of motion.     Right knee: No crepitus. No tenderness.     Left knee: No crepitus. No tenderness.  Lymphadenopathy:     Cervical: No cervical adenopathy.  Neurological:     General: No focal deficit present.     Mental Status: She is alert.     Deep Tendon Reflexes: Reflexes are normal and symmetric.  Psychiatric:        Attention and Perception: Attention normal.        Mood and Affect: Mood normal.        Speech: Speech normal.        Behavior: Behavior normal.    Results for orders placed or performed in visit on 05/06/21  Comprehensive metabolic panel  Result Value Ref Range   Glucose 102 (H) 70 - 99 mg/dL   BUN 10 8 - 27 mg/dL   Creatinine, Ser 0.86 0.57 - 1.00 mg/dL   eGFR 72 >59 mL/min/1.73   BUN/Creatinine Ratio 12 12 - 28   Sodium 144 134 - 144 mmol/L   Potassium 4.1 3.5 - 5.2 mmol/L   Chloride 107 (H) 96 - 106 mmol/L   CO2 22 20 - 29 mmol/L   Calcium 8.9 8.7 - 10.3 mg/dL   Total Protein 6.9 6.0 - 8.5 g/dL   Albumin 4.2 3.7 - 4.7 g/dL   Globulin, Total 2.7 1.5 - 4.5 g/dL   Albumin/Globulin Ratio 1.6 1.2 - 2.2   Bilirubin Total 0.3 0.0 - 1.2 mg/dL   Alkaline Phosphatase 161 (H) 44 - 121 IU/L   AST 17 0 - 40 IU/L   ALT 15 0 - 32 IU/L  Lipid Panel w/o Chol/HDL Ratio  Result Value Ref Range   Cholesterol, Total 160 100 - 199 mg/dL   Triglycerides 44 0 - 149 mg/dL   HDL 62 >39 mg/dL   VLDL Cholesterol Cal 10 5 - 40 mg/dL   LDL Chol Calc (NIH) 88 0 - 99 mg/dL  Gamma GT  Result Value Ref Range   GGT 9 0 - 60 IU/L      Assessment & Plan:   Problem List Items Addressed This Visit       Cardiovascular and Mediastinum   Hypertension    Chronic, stable.  BP at goal at home and in office on check.  Will contiue Amlodipine 5 MG daily and if elevations noted in upcoming months will increase to 10 MG. Focus on DASH diet and regular exercise at home.  Monitor BP daily.  Return in 6 months for follow-up.        Relevant Medications   rosuvastatin (CRESTOR) 20 MG tablet   amLODipine (NORVASC) 5 MG tablet   Other Relevant Orders   Comprehensive metabolic panel   TSH  Musculoskeletal and Integument   Rheumatoid arthritis, seropositive (HCC) - Primary    Chronic, ongoing.  Continue collaboration with rheumatology + current medication regimen.  Check CMP today.      Relevant Orders   Comprehensive metabolic panel   Osteoarthritis    Ongoing to knees, will continue collaboration with ortho and recommend she return to see them as needed.        Other   Hyperlipidemia    Chronic, ongoing.  Continue current medication regimen and adjust as needed.  Lipid panel today.      Relevant Medications   rosuvastatin (CRESTOR) 20 MG tablet   amLODipine (NORVASC) 5 MG tablet   Other Relevant Orders   Comprehensive metabolic panel   Lipid Panel w/o Chol/HDL Ratio   Obesity    BMI 35.08.  Recommended eating smaller high protein, low fat meals more frequently and exercising 30 mins a day 5 times a week with a goal of 10-15lb weight loss in the next 3 months. Patient voiced their understanding and motivation to adhere to these recommendations.       Elevated alkaline phosphatase level    Previously elevated on labs. Check gamma GT today. Consider GI referral in the future if needed.       Relevant Orders   Comprehensive metabolic panel   Gamma GT   Long term methotrexate user    Check CMP today. Continue collaboration with rheumatology.       Relevant Orders   Comprehensive metabolic panel     Follow up plan: Return in about 6 months (around 05/06/2022) for HTN/HLD, RA, ALK PHOSPHATASE.

## 2021-11-04 NOTE — Progress Notes (Deleted)
BP 134/74   Pulse 79   Temp 98.2 F (36.8 C) (Oral)   Wt 224 lb (101.6 kg)   LMP  (LMP Unknown)   SpO2 95%   BMI 35.08 kg/m    Subjective:    Patient ID: Angela Watts, female    DOB: May 14, 1951, 71 y.o.   MRN: [id number]  HPI: Angela Watts is a 71 y.o. female  Chief Complaint  Patient presents with   Hyperlipidemia   Hypertension   Rheumatoid Arthritis   HYPERTENSION / HYPERLIPIDEMIA Continues on ASA, Amlodipine, + Crestor.      Has history of mild elevation ALK PHOS, ongoing, last 161.  Denies any pain or N&V.  Still has gall bladder. Satisfied with current treatment? {Blank single:19197::"yes","no"} Duration of hypertension: {Blank single:19197::"chronic","months","years"} BP monitoring frequency: {Blank single:19197::"not checking","rarely","daily","weekly","monthly","a few times a day","a few times a week","a few times a month"} BP range:  BP medication side effects: {Blank single:19197::"yes","no"} Duration of hyperlipidemia: {Blank single:19197::"chronic","months","years"} Cholesterol medication side effects: {Blank single:19197::"yes","no"} Cholesterol supplements: {Blank multiple:19196::"none","fish oil","niacin","red yeast rice"} Medication compliance: {Blank single:19197::"excellent compliance","good compliance","fair compliance","poor compliance"} Aspirin: {Blank single:19197::"yes","no"} Recent stressors: {Blank single:19197::"yes","no"} Recurrent headaches: {Blank single:19197::"yes","no"} Visual changes: {Blank single:19197::"yes","no"} Palpitations: {Blank single:19197::"yes","no"} Dyspnea: {Blank single:19197::"yes","no"} Chest pain: {Blank single:19197::"yes","no"} Lower extremity edema: {Blank single:19197::"yes","no"} Dizzy/lightheaded: {Blank single:19197::"yes","no"}   RHEUMATOID ARTHRITIS Followed by Dr. Posey Pronto and last seen 07/14/21.  She is to continue Methotrexate 7 tablets once a week and daily folic acid.  She reports this does help  with pain.   Duration: {Blank single:19197::"days","weeks","months"} Pain: {Blank single:19197::"yes","no"} Symmetric: {Blank single:19197::"yes","no"}  {Blank single:19197::"mild","moderate","severe","1/10","2/10","3/10","4/10","5/10","6/10","7/10","8/10","9/10","10/10"} Quality: {Blank multiple:19196::"sharp","dull","aching","burning","cramping","ill-defined","itchy","pressure-like","pulling","shooting","sore","stabbing","tender","tearing","throbbing"} Frequency: {Blank single:19197::"constant","intermittent","occasional","rare","every few minutes","a few times a hour","a few times a day","a few times a week","a few times a month","a few times a year"} Context:  {Blank multiple:19196::"better","worse","stable","fluctuating"} Decreased function/range of motion: {Blank single:19197::"yes","no"  Erythema: {Blank single:19197::"yes","no" Swelling: {Blank single:19197::"yes","no" Heat or warmth: {Blank single:19197::"yes","no" Morning stiffness: {Blank single:19197::"yes","no" Aggravating factors:  Alleviating factors:  Relief with NSAIDs?: {Blank single:19197::"No NSAIDs Taken","no","mild","moderate","significant"} Treatments attempted:  {Blank multiple:19196::"none","rest","ice","heat","APAP","ibuprofen","aleve","physical therapy","HEP","OMM"}  Involved Joints:     Hands: {Blank single:19197::"yes","no"} {Blank single:19197::"left","right","bilateral"}    Wrists: {Blank single:19197::"yes","no"} {Blank single:19197::"left","right","bilateral"}     Elbows: {Blank single:19197::"yes","no"} {Blank single:19197::"left","right","bilateral"}    Shoulders: {Blank single:19197::"yes","no"} {Blank single:19197::"left","right","bilateral"}    Back: {Blank single:19197::"yes","no"}     Hips: {Blank single:19197::"yes","no"} {Blank single:19197::"left","right","bilateral"}    Knees: {Blank single:19197::"yes","no"} {Blank single:19197::"left","right","bilateral"}    Ankles: {Blank  single:19197::"yes","no"} {Blank single:19197::"left","right","bilateral"}    Feet: {Blank single:19197::"yes","no"} {Blank single:19197::"left","right","bilateral"}   Relevant past medical, surgical, family and social history reviewed and updated as indicated. Interim medical history since our last visit reviewed. Allergies and medications reviewed and updated.  Review of Systems  Per HPI unless specifically indicated above     Objective:    BP 134/74   Pulse 79   Temp 98.2 F (36.8 C) (Oral)   Wt 224 lb (101.6 kg)   LMP  (LMP Unknown)   SpO2 95%   BMI 35.08 kg/m   Wt Readings from Last 3 Encounters:  11/04/21 224 lb (101.6 kg)  05/06/21 224 lb 6.4 oz (101.8 kg)  02/16/21 227 lb (103 kg)    Physical Exam  Results for orders placed or performed in visit on 05/06/21  Comprehensive metabolic panel  Result Value Ref Range   Glucose 102 (H) 70 - 99 mg/dL   BUN 10 8 - 27 mg/dL   Creatinine, Ser 0.86 0.57 - 1.00 mg/dL   eGFR 72 >59 mL/min/1.73   BUN/Creatinine Ratio 12 12 - 28  Sodium 144 134 - 144 mmol/L   Potassium 4.1 3.5 - 5.2 mmol/L   Chloride 107 (H) 96 - 106 mmol/L   CO2 22 20 - 29 mmol/L   Calcium 8.9 8.7 - 10.3 mg/dL   Total Protein 6.9 6.0 - 8.5 g/dL   Albumin 4.2 3.7 - 4.7 g/dL   Globulin, Total 2.7 1.5 - 4.5 g/dL   Albumin/Globulin Ratio 1.6 1.2 - 2.2   Bilirubin Total 0.3 0.0 - 1.2 mg/dL   Alkaline Phosphatase 161 (H) 44 - 121 IU/L   AST 17 0 - 40 IU/L   ALT 15 0 - 32 IU/L  Lipid Panel w/o Chol/HDL Ratio  Result Value Ref Range   Cholesterol, Total 160 100 - 199 mg/dL   Triglycerides 44 0 - 149 mg/dL   HDL 62 >39 mg/dL   VLDL Cholesterol Cal 10 5 - 40 mg/dL   LDL Chol Calc (NIH) 88 0 - 99 mg/dL  Gamma GT  Result Value Ref Range   GGT 9 0 - 60 IU/L      Assessment & Plan:   Problem List Items Addressed This Visit       Cardiovascular and Mediastinum   Hypertension   Relevant Orders   Comprehensive metabolic panel   TSH      Musculoskeletal and Integument   Rheumatoid arthritis, seropositive (HCC) - Primary   Relevant Orders   Comprehensive metabolic panel     Other   Elevated alkaline phosphatase level   Relevant Orders   Comprehensive metabolic panel   Gamma GT   Hyperlipidemia   Relevant Orders   Comprehensive metabolic panel   Lipid Panel w/o Chol/HDL Ratio   Long term methotrexate user   Relevant Orders   Comprehensive metabolic panel   Obesity     Follow up plan: No follow-ups on file.

## 2021-11-05 LAB — LIPID PANEL W/O CHOL/HDL RATIO
Cholesterol, Total: 168 mg/dL (ref 100–199)
HDL: 64 mg/dL (ref >39)
LDL Chol Calc (NIH): 94 mg/dL (ref 0–99)
Triglycerides: 51 mg/dL (ref 0–149)
VLDL Cholesterol Cal: 10 mg/dL (ref 5–40)

## 2021-11-05 LAB — COMPREHENSIVE METABOLIC PANEL
ALT: 19 IU/L (ref 0–32)
AST: 19 IU/L (ref 0–40)
Albumin/Globulin Ratio: 1.6 (ref 1.2–2.2)
Albumin: 4.1 g/dL (ref 3.7–4.7)
Alkaline Phosphatase: 153 IU/L — ABNORMAL HIGH (ref 44–121)
BUN/Creatinine Ratio: 15 (ref 12–28)
BUN: 14 mg/dL (ref 8–27)
Bilirubin Total: 0.3 mg/dL (ref 0.0–1.2)
CO2: 21 mmol/L (ref 20–29)
Calcium: 9.1 mg/dL (ref 8.7–10.3)
Chloride: 104 mmol/L (ref 96–106)
Creatinine, Ser: 0.92 mg/dL (ref 0.57–1.00)
Globulin, Total: 2.6 g/dL (ref 1.5–4.5)
Glucose: 92 mg/dL (ref 70–99)
Potassium: 4 mmol/L (ref 3.5–5.2)
Sodium: 140 mmol/L (ref 134–144)
Total Protein: 6.7 g/dL (ref 6.0–8.5)
eGFR: 66 mL/min/{1.73_m2} (ref >59)

## 2021-11-05 LAB — GAMMA GT: GGT: 9 IU/L (ref 0–60)

## 2021-11-05 LAB — TSH: TSH: 2.64 u[IU]/mL (ref 0.450–4.500)

## 2021-11-05 NOTE — Progress Notes (Signed)
Good afternoon, please let Novalyn know her labs have returned and everything remains stable.  No changes to current medications needed.  She is doing a great job!! Any questions? Keep being amazing!!  Thank you for allowing me to participate in your care.  I appreciate you. Kindest regards, Cesar Rogerson

## 2021-11-10 DIAGNOSIS — M13861 Other specified arthritis, right knee: Secondary | ICD-10-CM | POA: Diagnosis not present

## 2021-11-10 DIAGNOSIS — M069 Rheumatoid arthritis, unspecified: Secondary | ICD-10-CM | POA: Diagnosis not present

## 2021-11-10 DIAGNOSIS — M25562 Pain in left knee: Secondary | ICD-10-CM | POA: Diagnosis not present

## 2021-11-10 DIAGNOSIS — M25561 Pain in right knee: Secondary | ICD-10-CM | POA: Diagnosis not present

## 2021-11-10 DIAGNOSIS — M13862 Other specified arthritis, left knee: Secondary | ICD-10-CM | POA: Diagnosis not present

## 2021-11-13 DIAGNOSIS — M170 Bilateral primary osteoarthritis of knee: Secondary | ICD-10-CM | POA: Diagnosis not present

## 2021-11-13 DIAGNOSIS — Z79899 Other long term (current) drug therapy: Secondary | ICD-10-CM | POA: Diagnosis not present

## 2021-11-13 DIAGNOSIS — M059 Rheumatoid arthritis with rheumatoid factor, unspecified: Secondary | ICD-10-CM | POA: Diagnosis not present

## 2021-12-23 DIAGNOSIS — M170 Bilateral primary osteoarthritis of knee: Secondary | ICD-10-CM | POA: Diagnosis not present

## 2022-01-08 ENCOUNTER — Other Ambulatory Visit: Payer: Self-pay | Admitting: Nurse Practitioner

## 2022-01-08 DIAGNOSIS — Z1231 Encounter for screening mammogram for malignant neoplasm of breast: Secondary | ICD-10-CM

## 2022-02-02 ENCOUNTER — Ambulatory Visit
Admission: RE | Admit: 2022-02-02 | Discharge: 2022-02-02 | Disposition: A | Discharge status: Home / Self Care | Payer: Medicare Other | Source: Ambulatory Visit | Attending: Nurse Practitioner | Admitting: Nurse Practitioner

## 2022-02-02 DIAGNOSIS — Z1231 Encounter for screening mammogram for malignant neoplasm of breast: Secondary | ICD-10-CM | POA: Insufficient documentation

## 2022-02-19 DIAGNOSIS — Z23 Encounter for immunization: Secondary | ICD-10-CM | POA: Diagnosis not present

## 2022-03-16 DIAGNOSIS — M059 Rheumatoid arthritis with rheumatoid factor, unspecified: Secondary | ICD-10-CM | POA: Diagnosis not present

## 2022-03-16 DIAGNOSIS — Z79899 Other long term (current) drug therapy: Secondary | ICD-10-CM | POA: Diagnosis not present

## 2022-03-16 DIAGNOSIS — M170 Bilateral primary osteoarthritis of knee: Secondary | ICD-10-CM | POA: Diagnosis not present

## 2022-03-16 DIAGNOSIS — L409 Psoriasis, unspecified: Secondary | ICD-10-CM | POA: Diagnosis not present

## 2022-03-24 ENCOUNTER — Telehealth: Payer: Self-pay | Admitting: Nurse Practitioner

## 2022-03-24 DIAGNOSIS — M13861 Other specified arthritis, right knee: Secondary | ICD-10-CM | POA: Diagnosis not present

## 2022-03-24 DIAGNOSIS — M13862 Other specified arthritis, left knee: Secondary | ICD-10-CM | POA: Diagnosis not present

## 2022-03-24 NOTE — Telephone Encounter (Signed)
Left message for patient to call back and schedule the Medicare Annual Wellness Visit (AWV) virtually or by telephone.  Last AWV 03/30/21  Please schedule at anytime with [facility]Nurse Health Advisor.    Any questions, please call me at [phone]

## 2022-04-03 DIAGNOSIS — Z23 Encounter for immunization: Secondary | ICD-10-CM | POA: Diagnosis not present

## 2022-05-02 NOTE — Patient Instructions (Signed)

## 2022-05-06 ENCOUNTER — Ambulatory Visit (INDEPENDENT_AMBULATORY_CARE_PROVIDER_SITE_OTHER): Payer: Medicare Other | Admitting: Nurse Practitioner

## 2022-05-06 ENCOUNTER — Encounter: Payer: Self-pay | Admitting: Nurse Practitioner

## 2022-05-06 VITALS — BP 119/71 | HR 91 | Temp 98.3°F | Ht 67.01 in | Wt 215.9 lb

## 2022-05-06 DIAGNOSIS — Z79631 Long term (current) use of antimetabolite agent: Secondary | ICD-10-CM | POA: Diagnosis not present

## 2022-05-06 DIAGNOSIS — Z23 Encounter for immunization: Secondary | ICD-10-CM

## 2022-05-06 DIAGNOSIS — E7800 Pure hypercholesterolemia, unspecified: Secondary | ICD-10-CM | POA: Diagnosis not present

## 2022-05-06 DIAGNOSIS — I10 Essential (primary) hypertension: Secondary | ICD-10-CM

## 2022-05-06 DIAGNOSIS — Z6833 Body mass index (BMI) 33.0-33.9, adult: Secondary | ICD-10-CM

## 2022-05-06 DIAGNOSIS — M059 Rheumatoid arthritis with rheumatoid factor, unspecified: Secondary | ICD-10-CM

## 2022-05-06 DIAGNOSIS — Z0000 Encounter for general adult medical examination without abnormal findings: Secondary | ICD-10-CM

## 2022-05-06 DIAGNOSIS — R748 Abnormal levels of other serum enzymes: Secondary | ICD-10-CM

## 2022-05-06 DIAGNOSIS — Z7189 Other specified counseling: Secondary | ICD-10-CM

## 2022-05-06 NOTE — Progress Notes (Signed)
BP 119/71   Pulse 91   Temp 98.3 F (36.8 C) (Oral)   Ht 5' 7.01" (1.702 m)   Wt 215 lb 14.4 oz (97.9 kg)   LMP  (LMP Unknown)   SpO2 98%   BMI 33.81 kg/m    Subjective:    Patient ID: Angela Watts, female    DOB: 08-07-1950, 71 y.o.   MRN: [id number]  HPI: Angela Watts is a 71 y.o. female presenting on 05/06/2022 for Medicare Wellness and follow-up visit. Current medical complaints include:none  She currently lives with: self Menopausal Symptoms: no  HYPERTENSION / HYPERLIPIDEMIA Continues on ASA, Amlodipine, + Crestor.  She is working on [employer] changes and has lost 9 pounds since last visit.    Has history of mild elevation in alkaline phosphatase, ongoing, last 156.  Denies any abdominal pain or N&V.  Still has gall bladder. Satisfied with current treatment? yes Duration of hypertension: chronic BP monitoring frequency: every two weeks BP range: 120-130/70 range BP medication side effects: no Duration of hyperlipidemia: chronic Cholesterol medication side effects: no Cholesterol supplements: none Medication compliance: good compliance Aspirin: yes Recent stressors: no Recurrent headaches: no Visual changes: no Palpitations: no Dyspnea: no Chest pain: no Lower extremity edema: no Dizzy/lightheaded: no    RHEUMATOID ARTHRITIS & KNEE PAIN: Followed by Dr. Posey Pronto and last visit 03/16/22.  She is to continue Methotrexate and daily folic acid.  This helps with her overall pain. Duration:  chronic Pain: yes Symmetric: yes  mild Quality: dull and aching Frequency: intermittent Context:  stable Decreased function/range of motion: occasional Erythema: no Swelling: no Heat or warmth: no Morning stiffness: yes Aggravating factors: unknown Alleviating factors: medications Relief with NSAIDs?: No NSAIDs Taken Treatments attempted:  Tylenol and RA treatment  Involved Joints:     Hands: yes bilateral    Wrists: yes bilateral     Elbows: yes  bilateral    Shoulders: yes bilateral    Back:  occasional      Hips: yes left    Knees: yes right    Ankles: no    Feet: no  A voluntary discussion about advance care planning including the explanation and discussion of advance directives was extensively discussed  with the patient for 10 minutes with patient present.  Explanation about the health care proxy and Living will was reviewed and packet with forms with explanation of how to fill them out was given.  During this discussion, the patient was able to identify a health care proxy as daughter and plans to fill out the paperwork required.  Patient was offered a separate [place] visit for further assistance with forms.        05/06/2022    9:32 AM 11/04/2021    9:12 AM 03/30/2021   11:35 AM  Depression screen PHQ 2/9  Decreased Interest 0 1 0  Down, Depressed, Hopeless 0 1 0  PHQ - 2 Score 0 2 0  Altered sleeping 0 1   Tired, decreased energy 0 2   Change in appetite 0 0   Feeling bad or failure about yourself  0 1   Trouble concentrating 0 0   Moving slowly or fidgety/restless 0 0   Suicidal thoughts 0 0   PHQ-9 Score 0 6   Difficult doing work/chores Not difficult at all Not difficult at all        05/06/2022    9:32 AM 11/04/2021    9:13 AM  GAD 7 :  Generalized Anxiety Score  Nervous, Anxious, on Edge 0 1  Control/stop worrying 0 1  Worry too much - different things 0 1  Trouble relaxing 0 0  Restless 0 0  Easily annoyed or irritable 0 1  Afraid - awful might happen 0 0  Total GAD 7 Score 0 4  Anxiety Difficulty Not difficult at all Not difficult at all   Functional Status Survey: Is the patient deaf or have difficulty hearing?: No Does the patient have difficulty seeing, even when wearing glasses/contacts?: No Does the patient have difficulty concentrating, remembering, or making decisions?: No Does the patient have difficulty walking or climbing stairs?: No Does the patient have difficulty  dressing or bathing?: No Does the patient have difficulty doing errands alone such as visiting a doctor's office or shopping?: No      05/06/2020   10:04 AM 02/16/2021    1:47 PM 03/30/2021   11:25 AM 11/04/2021    9:13 AM 05/06/2022    9:32 AM  Fall Risk  Falls in the past year? 0 0 0 0 0  Was there an injury with Fall? 0  0 0 0  Fall Risk Category Calculator 0  0 0 0  Fall Risk Category Low  Low Low Low  Patient Fall Risk Level    Low fall risk Low fall risk  Patient at Risk for Falls Due to    No Fall Risks No Fall Risks  Fall risk Follow up    Falls evaluation completed Falls prevention discussed     Past Medical History:  Past Medical History:  Diagnosis Date   Abnormal Pap smear of cervix    Abscess of bursa, left hip    Arthritis    Colon adenomas    GERD (gastroesophageal reflux disease)    Hyperlipidemia    Hypertension    Psoriasis    Rheumatoid arthritis flare ([place])     Surgical History:  Past Surgical History:  Procedure Laterality Date   ABDOMINAL HYSTERECTOMY  2003   BREAST BIOPSY Left 06/01/2016   [place] bx done by dr byrnett 6:00 2cmfn intraducal papilloma with calcs   BREAST BIOPSY Left 02/05/2019   Affirm bx-"X" clip-benign parenchyma with fragments of sclerosed intraductal papilloma   BREAST BIOPSY Left 2012   [place] bx done by Dr. Bary Castilla, intraductal papilloma   BREAST CYST ASPIRATION Left 2011   NEG/ Br Byrnett   BREAST CYST ASPIRATION Left 06/01/2016   dr byrnett 5:00 3cmfn aspirated with complete resolution   COLONOSCOPY WITH PROPOFOL N/A 06/11/2015   Procedure: COLONOSCOPY WITH PROPOFOL;  Surgeon: Manya Silvas, MD;  Location: [facility] ENDOSCOPY;  Service: Endoscopy;  Laterality: N/A;   COLONOSCOPY WITH PROPOFOL N/A 07/14/2018   Procedure: COLONOSCOPY WITH PROPOFOL;  Surgeon: Manya Silvas, MD;  Location: [facility] ENDOSCOPY;  Service: Endoscopy;  Laterality: N/A;   CYST EXCISION Left    breast    Medications:  Current Outpatient Medications on File  Prior to Visit  Medication Sig   amLODipine (NORVASC) 5 MG tablet Take 1 tablet (5 mg total) by mouth daily.   folic acid (FOLVITE) 1 MG tablet Take 1 tablet by mouth daily.   methotrexate (RHEUMATREX) 2.5 MG tablet Take 7 tabs (17.5 MG ) once a week for 12 weeks   Multiple Vitamin (MULTIVITAMIN) capsule Take 1 capsule by mouth daily.   rosuvastatin (CRESTOR) 20 MG tablet TAKE 1 TABLET THREE TIMES WEEKLY ON MONDAY, WEDNESDAY, AND FRIDAY AT 6 PM   No current facility-administered  medications on file prior to visit.    Allergies:  Allergies  Allergen Reactions   Prednisone Itching    Social History:  Social History   Socioeconomic History   Marital status: Single    Spouse name: Not on file   Number of children: Not on file   Years of education: Not on file   Highest education level: Not on file  Occupational History   Not on file  Tobacco Use   Smoking status: Never   Smokeless tobacco: Never  Vaping Use   Vaping Use: Never used  Substance and Sexual Activity   Alcohol use: Yes    Alcohol/week: 0.0 standard drinks of alcohol    Comment: on occasion/socially   Drug use: No   Sexual activity: Never  Other Topics Concern   Not on file  Social History Narrative   Not on file   Social Determinants of Health   Financial Resource Strain: Low Risk  (05/06/2022)   Overall Financial Resource Strain (CARDIA)    Difficulty of Paying Living Expenses: Not hard at all  Food Insecurity: No Food Insecurity (05/06/2022)   Hunger Vital Sign    Worried About Running Out of Food in the Last Year: Never true    Ran Out of Food in the Last Year: Never true  Transportation Needs: No Transportation Needs (05/06/2022)   PRAPARE - Hydrologist (Medical): No    Lack of Transportation (Non-Medical): No  Physical Activity: Insufficiently Active (05/06/2022)   Exercise Vital Sign    Days of Exercise per Week: 2 days    Minutes of Exercise per Session: 20 min   Stress: No Stress Concern Present (05/06/2022)   [place]    Feeling of Stress : Only a little  Social Connections: Moderately Integrated (05/06/2022)   Social Connection and Isolation Panel [NHANES]    Frequency of Communication with Friends and Family: More than three times a week    Frequency of Social Gatherings with Friends and Family: More than three times a week    Attends Religious Services: More than 4 times per year    Active Member of [employer] or Organizations: Yes    Attends Archivist Meetings: 1 to 4 times per year    Marital Status: Never married  Intimate Partner Violence: Not At Risk (05/06/2022)   Humiliation, Afraid, Rape, and Kick questionnaire    Fear of Current or Ex-Partner: No    Emotionally Abused: No    Physically Abused: No    Sexually Abused: No   Social History   Tobacco Use  Smoking Status Never  Smokeless Tobacco Never   Social History   Substance and Sexual Activity  Alcohol Use Yes   Alcohol/week: 0.0 standard drinks of alcohol   Comment: on occasion/socially    Family History:  Family History  Problem Relation Age of Onset   Hypertension Mother    Arthritis Mother    Breast cancer Mother        2   Cancer Mother        breast   Hypertension Father    Lung cancer Father    Diabetes Sister    Hypertension Sister    Hypertension Sister    Hypertension Sister    Hypertension Sister    Hypertension Brother    Hypertension Brother    Cancer Brother        lung    Past  medical history, surgical history, medications, allergies, family history and social history reviewed with patient today and changes made to appropriate areas of the chart.   ROS All other ROS negative except what is listed above and in the HPI.      Objective:    BP 119/71   Pulse 91   Temp 98.3 F (36.8 C) (Oral)   Ht 5' 7.01" (1.702 m)   Wt 215 lb 14.4 oz (97.9 kg)   LMP  (LMP  Unknown)   SpO2 98%   BMI 33.81 kg/m   Wt Readings from Last 3 Encounters:  05/06/22 215 lb 14.4 oz (97.9 kg)  11/04/21 224 lb (101.6 kg)  05/06/21 224 lb 6.4 oz (101.8 kg)    Physical Exam Vitals and nursing note reviewed. Exam conducted with a chaperone present.  Constitutional:      General: She is awake. She is not in acute distress.    Appearance: She is well-developed and well-groomed. She is obese. She is not ill-appearing or toxic-appearing.  HENT:     Head: Normocephalic and atraumatic.     Right Ear: Hearing, tympanic membrane, ear canal and external ear normal. No drainage.     Left Ear: Hearing, tympanic membrane, ear canal and external ear normal. No drainage.     Nose: Nose normal.     Right Sinus: No maxillary sinus tenderness or frontal sinus tenderness.     Left Sinus: No maxillary sinus tenderness or frontal sinus tenderness.     Mouth/Throat:     Mouth: Mucous membranes are moist.     Pharynx: Oropharynx is clear. Uvula midline. No pharyngeal swelling, oropharyngeal exudate or posterior oropharyngeal erythema.  Eyes:     General: Lids are normal.        Right eye: No discharge.        Left eye: No discharge.     Extraocular Movements: Extraocular movements intact.     Conjunctiva/sclera: Conjunctivae normal.     Pupils: Pupils are equal, round, and reactive to light.     Visual Fields: Right eye visual fields normal and left eye visual fields normal.  Neck:     Thyroid: No thyromegaly.     Vascular: No carotid bruit.     Trachea: Trachea normal.  Cardiovascular:     Rate and Rhythm: Normal rate and regular rhythm.     Heart sounds: Normal heart sounds. No murmur heard.    No gallop.  Pulmonary:     Effort: Pulmonary effort is normal. No accessory muscle usage or respiratory distress.     Breath sounds: Normal breath sounds.  Chest:  Breasts:    Right: Normal.     Left: Normal.  Abdominal:     General: Bowel sounds are normal.     Palpations: Abdomen  is soft. There is no hepatomegaly or splenomegaly.     Tenderness: There is no abdominal tenderness.  Musculoskeletal:        General: Normal range of motion.     Cervical back: Normal range of motion and neck supple.     Right lower leg: No edema.     Left lower leg: No edema.  Lymphadenopathy:     Head:     Right side of head: No submental, submandibular, tonsillar, preauricular or posterior auricular adenopathy.     Left side of head: No submental, submandibular, tonsillar, preauricular or posterior auricular adenopathy.     Cervical: No cervical adenopathy.     Upper Body:  Right upper body: No supraclavicular, axillary or pectoral adenopathy.     Left upper body: No supraclavicular, axillary or pectoral adenopathy.  Skin:    General: Skin is warm and dry.     Capillary Refill: Capillary refill takes less than 2 seconds.     Findings: No rash.  Neurological:     Mental Status: She is alert and oriented to person, place, and time.     Gait: Gait is intact.     Deep Tendon Reflexes: Reflexes are normal and symmetric.     Reflex Scores:      Brachioradialis reflexes are 2+ on the right side and 2+ on the left side.      Patellar reflexes are 2+ on the right side and 2+ on the left side. Psychiatric:        Attention and Perception: Attention normal.        Mood and Affect: Mood normal.        Speech: Speech normal.        Behavior: Behavior normal. Behavior is cooperative.        Thought Content: Thought content normal.        Judgment: Judgment normal.     Results for orders placed or performed in visit on 11/04/21  Comprehensive metabolic panel  Result Value Ref Range   Glucose 92 70 - 99 mg/dL   BUN 14 8 - 27 mg/dL   Creatinine, Ser 0.92 0.57 - 1.00 mg/dL   eGFR 66 >59 mL/min/1.73   BUN/Creatinine Ratio 15 12 - 28   Sodium 140 134 - 144 mmol/L   Potassium 4.0 3.5 - 5.2 mmol/L   Chloride 104 96 - 106 mmol/L   CO2 21 20 - 29 mmol/L   Calcium 9.1 8.7 - 10.3 mg/dL    Total Protein 6.7 6.0 - 8.5 g/dL   Albumin 4.1 3.7 - 4.7 g/dL   Globulin, Total 2.6 1.5 - 4.5 g/dL   Albumin/Globulin Ratio 1.6 1.2 - 2.2   Bilirubin Total 0.3 0.0 - 1.2 mg/dL   Alkaline Phosphatase 153 (H) 44 - 121 IU/L   AST 19 0 - 40 IU/L   ALT 19 0 - 32 IU/L  Lipid Panel w/o Chol/HDL Ratio  Result Value Ref Range   Cholesterol, Total 168 100 - 199 mg/dL   Triglycerides 51 0 - 149 mg/dL   HDL 64 >39 mg/dL   VLDL Cholesterol Cal 10 5 - 40 mg/dL   LDL Chol Calc (NIH) 94 0 - 99 mg/dL  Gamma GT  Result Value Ref Range   GGT 9 0 - 60 IU/L  TSH  Result Value Ref Range   TSH 2.640 0.450 - 4.500 uIU/mL      Assessment & Plan:   Problem List Items Addressed This Visit       Cardiovascular and Mediastinum   Hypertension    Chronic, stable.  BP at goal in office today and on home checks.  Will continue Amlodipine 5 MG daily and if elevations noted in upcoming visits will increase to 10 MG.  Remain off HCTZ due to urinary urgency with this in past.  LABS: CBC, CMP, TSH.  Focus on DASH diet and regular exercise at home.  Monitor BP daily.  Return in 6 months for follow-up.      Relevant Orders   CBC with Differential/Platelet   TSH     Musculoskeletal and Integument   Rheumatoid arthritis, seropositive (HCC)    Chronic, ongoing.  Continue collaboration  with rheumatology + current medication regimen.  Recent notes and labs reviewed.  Check CMP today.      Relevant Orders   CBC with Differential/Platelet   Comprehensive metabolic panel     Other   Advanced care planning/counseling discussion    Discussed with patient, she will bring copy of her POA to scan into chart.      Elevated alkaline phosphatase level    Recheck on labs today and if consistent trend upwards obtain u/s and refer to GI.      Relevant Orders   Comprehensive metabolic panel   Gamma GT   Hyperlipidemia    Chronic, ongoing.  Continue current medication regimen and adjust as needed.  Lipid panel  today.      Relevant Orders   Comprehensive metabolic panel   Lipid Panel w/o Chol/HDL Ratio   Long term methotrexate user    Check CMP today, CBC up to date with rheumatology.      Relevant Orders   CBC with Differential/Platelet   Comprehensive metabolic panel   Obesity    BMI 33.81 with 9 pounds weight loss.  Praised for this.  Recommended eating smaller high protein, low fat meals more frequently and exercising 30 mins a day 5 times a week with a goal of 10-15lb weight loss in the next 3 months. Patient voiced their understanding and motivation to adhere to these recommendations.       Other Visit Diagnoses     Medicare annual wellness visit, subsequent    -  Primary   Medicare Wellness Due, completed today with patient.        Follow up plan: Return in about 6 months (around 11/05/2022) for HTN/HLD, RA, OA.   LABORATORY TESTING:  - Pap smear: not applicable  IMMUNIZATIONS:   - Tdap: Tetanus vaccination status reviewed: last tetanus booster within 10 years. - Influenza: Up to date - Pneumovax: Up to date - Prevnar: Up to date - COVID: Up to date - HPV: Not applicable - Shingrix vaccine:  will get at pharmacy  SCREENING: -Mammogram: Up to date  - Colonoscopy: Up to date  - Bone Density: Up to date  -Hearing Test: Not applicable  -Spirometry: Not applicable   PATIENT COUNSELING:   Advised to take 1 mg of folate supplement per day if capable of pregnancy.   Sexuality: Discussed sexually transmitted diseases, partner selection, use of condoms, avoidance of unintended pregnancy  and contraceptive alternatives.   Advised to avoid cigarette smoking.  I discussed with the patient that most people either abstain from alcohol or drink within safe limits (<=14/week and <=4 drinks/occasion for males, <=7/weeks and <= 3 drinks/occasion for females) and that the risk for alcohol disorders and other health effects rises proportionally with the number of drinks per week  and how often a drinker exceeds daily limits.  Discussed cessation/primary prevention of drug use and availability of treatment for abuse.   Diet: Encouraged to adjust caloric intake to maintain  or achieve ideal body weight, to reduce intake of dietary saturated fat and total fat, to limit sodium intake by avoiding high sodium foods and not adding table salt, and to maintain adequate dietary potassium and calcium preferably from fresh fruits, vegetables, and low-fat dairy products.    Stressed the importance of regular exercise  Injury prevention: Discussed safety belts, safety helmets, smoke detector, smoking near bedding or upholstery.   Dental health: Discussed importance of regular tooth brushing, flossing, and dental visits.    NEXT  PREVENTATIVE PHYSICAL DUE IN 1 YEAR. Return in about 6 months (around 11/05/2022) for HTN/HLD, RA, OA.

## 2022-05-06 NOTE — Assessment & Plan Note (Signed)
Chronic, ongoing.  Continue current medication regimen and adjust as needed. Lipid panel today. 

## 2022-05-06 NOTE — Assessment & Plan Note (Signed)
Check CMP today, CBC up to date with rheumatology.

## 2022-05-06 NOTE — Assessment & Plan Note (Signed)
Discussed with patient, she will bring copy of her POA to scan into chart.

## 2022-05-06 NOTE — Assessment & Plan Note (Signed)
BMI 33.81 with 9 pounds weight loss.  Praised for this.  Recommended eating smaller high protein, low fat meals more frequently and exercising 30 mins a day 5 times a week with a goal of 10-15lb weight loss in the next 3 months. Patient voiced their understanding and motivation to adhere to these recommendations.

## 2022-05-06 NOTE — Assessment & Plan Note (Signed)
Recheck on labs today and if consistent trend upwards obtain u/s and refer to GI.

## 2022-05-06 NOTE — Assessment & Plan Note (Signed)
Chronic, stable.  BP at goal in office today and on home checks.  Will continue Amlodipine 5 MG daily and if elevations noted in upcoming visits will increase to 10 MG.  Remain off HCTZ due to urinary urgency with this in past.  LABS: CBC, CMP, TSH.  Focus on DASH diet and regular exercise at home.  Monitor BP daily.  Return in 6 months for follow-up.

## 2022-05-06 NOTE — Assessment & Plan Note (Addendum)
Chronic, ongoing.  Continue collaboration with rheumatology + current medication regimen.  Recent notes and labs reviewed.  Check CMP today.

## 2022-05-07 LAB — COMPREHENSIVE METABOLIC PANEL
ALT: 16 IU/L (ref 0–32)
AST: 18 IU/L (ref 0–40)
Albumin/Globulin Ratio: 1.6 (ref 1.2–2.2)
Albumin: 3.9 g/dL (ref 3.8–4.8)
Alkaline Phosphatase: 132 IU/L — ABNORMAL HIGH (ref 44–121)
BUN/Creatinine Ratio: 15 (ref 12–28)
BUN: 12 mg/dL (ref 8–27)
Bilirubin Total: 0.3 mg/dL (ref 0.0–1.2)
CO2: 22 mmol/L (ref 20–29)
Calcium: 9.1 mg/dL (ref 8.7–10.3)
Chloride: 104 mmol/L (ref 96–106)
Creatinine, Ser: 0.81 mg/dL (ref 0.57–1.00)
Globulin, Total: 2.5 g/dL (ref 1.5–4.5)
Glucose: 99 mg/dL (ref 70–99)
Potassium: 3.8 mmol/L (ref 3.5–5.2)
Sodium: 140 mmol/L (ref 134–144)
Total Protein: 6.4 g/dL (ref 6.0–8.5)
eGFR: 77 mL/min/{1.73_m2} (ref >59)

## 2022-05-07 LAB — TSH: TSH: 1.86 u[IU]/mL (ref 0.450–4.500)

## 2022-05-07 LAB — GAMMA GT: GGT: 7 IU/L (ref 0–60)

## 2022-05-07 LAB — CBC WITH DIFFERENTIAL/PLATELET
Basophils Absolute: 0 10*3/uL (ref 0.0–0.2)
Basos: 1 %
EOS (ABSOLUTE): 0.1 10*3/uL (ref 0.0–0.4)
Eos: 3 %
Hematocrit: 40.5 % (ref 34.0–46.6)
Hemoglobin: 13.1 g/dL (ref 11.1–15.9)
Immature Grans (Abs): 0 10*3/uL (ref 0.0–0.1)
Immature Granulocytes: 0 %
Lymphocytes Absolute: 1.3 10*3/uL (ref 0.7–3.1)
Lymphs: 30 %
MCH: 30 pg (ref 26.6–33.0)
MCHC: 32.3 g/dL (ref 31.5–35.7)
MCV: 93 fL (ref 79–97)
Monocytes Absolute: 0.6 10*3/uL (ref 0.1–0.9)
Monocytes: 14 %
Neutrophils Absolute: 2.3 10*3/uL (ref 1.4–7.0)
Neutrophils: 52 %
Platelets: 232 10*3/uL (ref 150–450)
RBC: 4.36 x10E6/uL (ref 3.77–5.28)
RDW: 14.5 % (ref 11.7–15.4)
WBC: 4.3 10*3/uL (ref 3.4–10.8)

## 2022-05-07 LAB — LIPID PANEL W/O CHOL/HDL RATIO
Cholesterol, Total: 175 mg/dL (ref 100–199)
HDL: 56 mg/dL (ref >39)
LDL Chol Calc (NIH): 110 mg/dL — ABNORMAL HIGH (ref 0–99)
Triglycerides: 46 mg/dL (ref 0–149)
VLDL Cholesterol Cal: 9 mg/dL (ref 5–40)

## 2022-05-07 NOTE — Progress Notes (Signed)
Good morning, please let Hedi know her labs have returned: - Kidney and liver function remains normal. - Alkaline phosphatase, which we have discussed, remains a little elevated but no worsening.  Other gall bladder lab is normal. - Thyroid is normal. - CBC shows no anemia or infection. - Cholesterol labs showed a little more elevation in LDL (bad cholesterol) this visit.  We will continue to monitor.  Keep taking 3 times a week Rosuvastatin.  Any questions? Keep being wonderful!!  Thank you for allowing me to participate in your care.  I appreciate you. Kindest regards, Jacson Rapaport

## 2022-07-14 DIAGNOSIS — M170 Bilateral primary osteoarthritis of knee: Secondary | ICD-10-CM | POA: Diagnosis not present

## 2022-07-14 DIAGNOSIS — L409 Psoriasis, unspecified: Secondary | ICD-10-CM | POA: Diagnosis not present

## 2022-07-14 DIAGNOSIS — Z13820 Encounter for screening for osteoporosis: Secondary | ICD-10-CM | POA: Diagnosis not present

## 2022-07-14 DIAGNOSIS — Z79899 Other long term (current) drug therapy: Secondary | ICD-10-CM | POA: Diagnosis not present

## 2022-07-14 DIAGNOSIS — M059 Rheumatoid arthritis with rheumatoid factor, unspecified: Secondary | ICD-10-CM | POA: Diagnosis not present

## 2022-07-22 DIAGNOSIS — Z780 Asymptomatic menopausal state: Secondary | ICD-10-CM | POA: Diagnosis not present

## 2022-09-20 DIAGNOSIS — M170 Bilateral primary osteoarthritis of knee: Secondary | ICD-10-CM | POA: Diagnosis not present

## 2022-11-01 NOTE — Patient Instructions (Addendum)
Be Involved in Your Health Care:  Taking Medications When medications are taken as directed, they can greatly improve your health. But if they are not taken as instructed, they may not work. In some cases, not taking them correctly can be harmful. To help ensure your treatment remains effective and safe, understand your medications and how to take them.  Your lab results, notes and after visit summary will be available on My Chart. We strongly encourage you to use this feature. If lab results are abnormal the clinic will contact you with the appropriate steps. If the clinic does not contact you assume the results are satisfactory. You can always see your results on My Chart. If you have questions regarding your condition, please contact the clinic during office hours. You can also ask questions on My Chart.  We at [facility] are grateful that you chose [place] to provide care. We strive to provide excellent and compassionate care and are always looking for feedback. If you get a survey from the clinic please complete this.   Food Choices for Gastroesophageal Reflux Disease, Adult -- take Pepcid as needed or take Pepcid 20 MG daily over the counter When you have gastroesophageal reflux disease (GERD), the foods you eat and your eating habits are very important. Choosing the right foods can help ease your discomfort. Think about working with a food expert (dietitian) to help you make good choices. What are tips for following this plan? Reading food labels Look for foods that are low in saturated fat. Foods that may help with your symptoms include: Foods that have less than 5% of daily value (DV) of fat. Foods that have 0 grams of trans fat. Cooking Do not fry your food. Cook your food by baking, steaming, grilling, or broiling. These are all methods that do not need a lot of fat for cooking. To add flavor, try to use herbs that are low in spice and acidity. Meal planning  Choose healthy  foods that are low in fat, such as: Fruits and vegetables. Whole grains. Low-fat dairy products. Lean meats, fish, and poultry. Eat small meals often instead of eating 3 large meals each day. Eat your meals slowly in a place where you are relaxed. Avoid bending over or lying down until 2-3 hours after eating. Limit high-fat foods such as fatty meats or fried foods. Limit your intake of fatty foods, such as oils, butter, and shortening. Avoid the following as told by your doctor: Foods that cause symptoms. These may be different for different people. Keep a food diary to keep track of foods that cause symptoms. Alcohol. Drinking a lot of liquid with meals. Eating meals during the 2-3 hours before bed. Lifestyle Stay at a healthy weight. Ask your doctor what weight is healthy for you. If you need to lose weight, work with your doctor to do so safely. Exercise for at least 30 minutes on 5 or more days each week, or as told by your doctor. Wear loose-fitting clothes. Do not smoke or use any products that contain nicotine or tobacco. If you need help quitting, ask your doctor. Sleep with the head of your bed higher than your feet. Use a wedge under the mattress or blocks under the bed frame to raise the head of the bed. Chew sugar-free gum after meals. What foods should eat?  Eat a healthy, well-balanced diet of fruits, vegetables, whole grains, low-fat dairy products, lean meats, fish, and poultry. Each person is different. Foods that may  cause symptoms in one person may not cause any symptoms in another person. Work with your doctor to find foods that are safe for you. The items listed above may not be a complete list of what you can eat and drink. Contact a food expert for more options. What foods should I avoid? Limiting some of these foods may help in managing the symptoms of GERD. Everyone is different. Talk with a food expert or your doctor to help you find the exact foods to avoid, if  any. Fruits Any fruits prepared with added fat. Any fruits that cause symptoms. For some people, this may include citrus fruits, such as oranges, grapefruit, pineapple, and lemons. Vegetables Deep-fried vegetables. [place] fries. Any vegetables prepared with added fat. Any vegetables that cause symptoms. For some people, this may include tomatoes and tomato products, chili peppers, onions and garlic, and horseradish. Grains Pastries or quick breads with added fat. Meats and other proteins High-fat meats, such as fatty beef or pork, hot dogs, ribs, ham, sausage, salami, and bacon. Fried meat or protein, including fried fish and fried chicken. Nuts and nut butters, in large amounts. Dairy Whole milk and chocolate milk. Sour cream. Cream. Ice cream. Cream cheese. Milkshakes. Fats and oils Butter. Margarine. Shortening. Ghee. Beverages Coffee and tea, with or without caffeine. Carbonated beverages. Sodas. Energy drinks. Fruit juice made with acidic fruits, such as orange or grapefruit. Tomato juice. Alcoholic drinks. Sweets and desserts Chocolate and cocoa. Donuts. Seasonings and condiments Pepper. Peppermint and spearmint. Added salt. Any condiments, herbs, or seasonings that cause symptoms. For some people, this may include curry, hot sauce, or vinegar-based salad dressings. The items listed above may not be a complete list of what you should not eat and drink. Contact a food expert for more options. Questions to ask your doctor Diet and lifestyle changes are often the first steps that are taken to manage symptoms of GERD. If diet and lifestyle changes do not help, talk with your doctor about taking medicines. Where to find more information International Foundation for Gastrointestinal Disorders: aboutgerd.org Summary When you have GERD, food and lifestyle choices are very important in easing your symptoms. Eat small meals often instead of 3 large meals a day. Eat your meals slowly and in a  place where you are relaxed. Avoid bending over or lying down until 2-3 hours after eating. Limit high-fat foods such as fatty meats or fried foods. This information is not intended to replace advice given to you by your health care provider. Make sure you discuss any questions you have with your health care provider. Document Revised: 11/12/2019 Document Reviewed: 11/12/2019 Elsevier Patient Education  2024 [employer].

## 2022-11-04 ENCOUNTER — Other Ambulatory Visit: Payer: Self-pay | Admitting: Nurse Practitioner

## 2022-11-04 NOTE — Telephone Encounter (Signed)
Requested Prescriptions  Pending Prescriptions Disp Refills   rosuvastatin (CRESTOR) 20 MG tablet [Pharmacy Med Name: ROSUVASTATIN CALCIUM 20 MG Tablet] 39 tablet 0    Sig: TAKE 1 TABLET THREE TIMES WEEKLY ON MONDAY, WEDNESDAY, AND FRIDAY AT 6 PM     Cardiovascular:  Antilipid - Statins 2 Failed - 11/04/2022 10:27 AM      Failed - Lipid Panel in normal range within the last 12 months    Cholesterol, Total  Date Value Ref Range Status  05/06/2022 175 100 - 199 mg/dL Final   Cholesterol Piccolo, Waived  Date Value Ref Range Status  11/02/2018 138 <200 mg/dL Final    Comment:                            Desirable                <200                         Borderline High      200- 239                         High                     >239    LDL Chol Calc (NIH)  Date Value Ref Range Status  05/06/2022 110 (H) 0 - 99 mg/dL Final   HDL  Date Value Ref Range Status  05/06/2022 56 >39 mg/dL Final   Triglycerides  Date Value Ref Range Status  05/06/2022 46 0 - 149 mg/dL Final   Triglycerides Piccolo,Waived  Date Value Ref Range Status  11/02/2018 49 <150 mg/dL Final    Comment:                            Normal                   <150                         Borderline High     150 - 199                         High                200 - 499                         Very High                >499          Passed - Cr in normal range and within 360 days    Creatinine, Ser  Date Value Ref Range Status  05/06/2022 0.81 0.57 - 1.00 mg/dL Final         Passed - Patient is not pregnant      Passed - Valid encounter within last 12 months    Recent Outpatient Visits           6 months ago Medicare annual wellness visit, subsequent   [facility] [place], [place] T, NP   1 year ago Rheumatoid arthritis, seropositive (HCC)   [facility] [place], Dorie Rank, NP   1  year ago Rheumatoid arthritis, seropositive (HCC)   [place]  [facility] [place], [place] T, NP   2 years ago Class 2 severe obesity due to excess calories with serious comorbidity and body mass index (BMI) of 36.0 to 36.9 in adult [facility])   [place] [facility] [place] [facility] [place], Corrie Dandy T, NP   2 years ago Rheumatoid arthritis involving multiple sites with positive rheumatoid factor (HCC)   [place] [facility] [place], Dorie Rank, NP       Future Appointments             Tomorrow Marjie Skiff, NP [place] [facility], [facility]

## 2022-11-05 ENCOUNTER — Ambulatory Visit (INDEPENDENT_AMBULATORY_CARE_PROVIDER_SITE_OTHER): Payer: Medicare Other | Admitting: Nurse Practitioner

## 2022-11-05 ENCOUNTER — Encounter: Payer: Self-pay | Admitting: Nurse Practitioner

## 2022-11-05 VITALS — BP 118/69 | HR 84 | Temp 97.8°F | Ht 67.01 in | Wt 210.2 lb

## 2022-11-05 DIAGNOSIS — Z6835 Body mass index (BMI) 35.0-35.9, adult: Secondary | ICD-10-CM | POA: Diagnosis not present

## 2022-11-05 DIAGNOSIS — M059 Rheumatoid arthritis with rheumatoid factor, unspecified: Secondary | ICD-10-CM

## 2022-11-05 DIAGNOSIS — K219 Gastro-esophageal reflux disease without esophagitis: Secondary | ICD-10-CM | POA: Diagnosis not present

## 2022-11-05 DIAGNOSIS — E7800 Pure hypercholesterolemia, unspecified: Secondary | ICD-10-CM | POA: Diagnosis not present

## 2022-11-05 DIAGNOSIS — Z79631 Long term (current) use of antimetabolite agent: Secondary | ICD-10-CM | POA: Diagnosis not present

## 2022-11-05 DIAGNOSIS — R748 Abnormal levels of other serum enzymes: Secondary | ICD-10-CM | POA: Diagnosis not present

## 2022-11-05 DIAGNOSIS — I10 Essential (primary) hypertension: Secondary | ICD-10-CM | POA: Diagnosis not present

## 2022-11-05 MED ORDER — AMLODIPINE BESYLATE 5 MG PO TABS: 5.0000 mg | ORAL_TABLET | Freq: Every day | ORAL | 4 refills | Status: DC

## 2022-11-05 MED ORDER — ROSUVASTATIN CALCIUM 20 MG PO TABS: ORAL_TABLET | ORAL | 4 refills | Status: DC

## 2022-11-05 NOTE — Assessment & Plan Note (Signed)
New onset over past months.  No medications at this time.  Recommend she trial OTC Pepcid 20 MG as needed or on daily basis dependent on needs and symptoms.  Educated her on this.  Discussed diet changes and focus on reduction of trigger foods.

## 2022-11-05 NOTE — Assessment & Plan Note (Signed)
Chronic, ongoing.  Continue current medication regimen and adjust as needed. Lipid panel today. 

## 2022-11-05 NOTE — Progress Notes (Signed)
BP 118/69   Pulse 84   Temp 97.8 F (36.6 C) (Oral)   Ht 5' 7.01" (1.702 m)   Wt 210 lb 3.2 oz (95.3 kg)   LMP  (LMP Unknown)   SpO2 98%   BMI 32.91 kg/m    Subjective:    Patient ID: Angela Watts, female    DOB: 1950/08/18, 72 y.o.   MRN: [id number]  HPI: Angela Watts is a 72 y.o. female  Chief Complaint  Patient presents with   Hypertension   Hyperlipidemia   RA   OA   HYPERTENSION / HYPERLIPIDEMIA Continues Amlodipine and Crestor (three times weekly).     Has history of mild elevation ALK PHOS, ongoing, last 132.  Denies any abdominal pain or N&V.  Still has gall bladder. Satisfied with current treatment? yes Duration of hypertension: chronic BP monitoring frequency: a few times a month BP range: 120-130/70-80 BP medication side effects: no Duration of hyperlipidemia: chronic Cholesterol medication side effects: no Cholesterol supplements: none Medication compliance: excellent compliance Aspirin: no Recent stressors: no Recurrent headaches: no Visual changes: no Palpitations: no Dyspnea: with exertion Chest pain: no Lower extremity edema: no Dizzy/lightheaded: no   ABDOMINAL PAIN  Having occasional gas pain and stomach upset on and off with heart burn.  Does raspy voice with this. Duration:months Onset: gradual Severity: 2/10 Quality: dull, aching, and cramping Location:  diffuse  Episode duration: not long -- hour Radiation: no Frequency: intermittent Alleviating factors: GasEx Aggravating factors: Pepper -- spicy items Status: fluctuating Treatments attempted: GasEx Fever: no Nausea: no Vomiting: no Weight loss: no Decreased appetite: no Diarrhea: no Constipation: no -- every day BM, no straining Blood in stool: no Heartburn: yes Jaundice: no Rash: no Dysuria/urinary frequency: no Hematuria: no History of sexually transmitted disease: no Recurrent NSAID use: no   RHEUMATOID ARTHRITIS Followed by Dr. Allena Katz and last seen  07/14/22 -- no changes made.  She is to continue Methotrexate 7 tablets (17.5 MG) once a week and daily folic acid. Continues to have knee pain on occasion -- saw Dr. Odis Luster with ortho in May and obtain knee injection. Duration: months Pain: yes Symmetric: yes  mild Quality: aching and throbbing Frequency: intermittent Context:  stable Decreased function/range of motion: no Erythema: no Swelling: no Heat or warmth: no Morning stiffness: yes  Aggravating factors: None Alleviating factors: Improves with movement Relief with NSAIDs?: No NSAIDs Taken Treatments attempted:  none Involved Joints:     Hands: yes, bilateral    Wrists: no     Elbows: no     Shoulders: no    Back: yes, occasional    Hips: no     Knees: yes bilateral    Ankles: no     Feet: no      11/05/2022    9:39 AM 05/06/2022    9:32 AM 11/04/2021    9:12 AM 03/30/2021   11:35 AM 05/06/2020   10:05 AM  Depression screen PHQ 2/9  Decreased Interest 0 0 1 0 0  Down, Depressed, Hopeless 1 0 1 0 1  PHQ - 2 Score 1 0 2 0 1  Altered sleeping 1 0 1  0  Tired, decreased energy 1 0 2  1  Change in appetite 0 0 0  0  Feeling bad or failure about yourself  0 0 1  0  Trouble concentrating 0 0 0  0  Moving slowly or fidgety/restless 1 0 0  1  Suicidal thoughts 0 0 0  0  PHQ-9 Score 4 0 6  3  Difficult doing work/chores  Not difficult at all Not difficult at all  Not difficult at all       11/05/2022    9:39 AM 05/06/2022    9:32 AM 11/04/2021    9:13 AM  GAD 7 : Generalized Anxiety Score  Nervous, Anxious, on Edge 1 0 1  Control/stop worrying 1 0 1  Worry too much - different things 1 0 1  Trouble relaxing 0 0 0  Restless 0 0 0  Easily annoyed or irritable 1 0 1  Afraid - awful might happen 1 0 0  Total GAD 7 Score 5 0 4  Anxiety Difficulty Somewhat difficult Not difficult at all Not difficult at all      Relevant past medical, surgical, family and social history reviewed and updated as indicated. Interim  medical history since our last visit reviewed. Allergies and medications reviewed and updated.  Review of Systems  Constitutional:  Negative for chills, fatigue and fever.  HENT:  Negative for facial swelling, sinus pressure and sinus pain.   Eyes: Negative.   Respiratory:  Negative for chest tightness and shortness of breath.   Cardiovascular:  Negative for chest pain and leg swelling.  Gastrointestinal: Negative.   Endocrine: Negative for polydipsia, polyphagia and polyuria.  Genitourinary: Negative.   Musculoskeletal:  Positive for arthralgias.  Skin: Negative.   Allergic/Immunologic: Negative.   Neurological:  Negative for dizziness, weakness, numbness and headaches.  Hematological: Negative.   Psychiatric/Behavioral: Negative.      Per HPI unless specifically indicated above     Objective:    BP 118/69   Pulse 84   Temp 97.8 F (36.6 C) (Oral)   Ht 5' 7.01" (1.702 m)   Wt 210 lb 3.2 oz (95.3 kg)   LMP  (LMP Unknown)   SpO2 98%   BMI 32.91 kg/m   Wt Readings from Last 3 Encounters:  11/05/22 210 lb 3.2 oz (95.3 kg)  05/06/22 215 lb 14.4 oz (97.9 kg)  11/04/21 224 lb (101.6 kg)    Physical Exam Vitals and nursing note reviewed.  Constitutional:      General: She is awake. She is not in acute distress.    Appearance: She is well-developed and well-groomed. She is obese. She is not ill-appearing or toxic-appearing.  HENT:     Head: Normocephalic.     Right Ear: Hearing and external ear normal. No drainage.     Left Ear: Hearing and external ear normal. No drainage.  Eyes:     General: Lids are normal.        Right eye: No discharge.        Left eye: No discharge.     Conjunctiva/sclera: Conjunctivae normal.  Neck:     Thyroid: No thyromegaly.     Vascular: No carotid bruit.  Cardiovascular:     Rate and Rhythm: Normal rate and regular rhythm.     Heart sounds: Normal heart sounds. No murmur heard.    No gallop.  Pulmonary:     Effort: Pulmonary effort is  normal. No accessory muscle usage or respiratory distress.     Breath sounds: Normal breath sounds.  Abdominal:     General: Bowel sounds are normal. There is no distension.     Palpations: Abdomen is soft.     Tenderness: There is no abdominal tenderness.  Musculoskeletal:     Cervical back: Normal range of motion.  Right lower leg: No edema.     Left lower leg: No edema.  Lymphadenopathy:     Cervical: No cervical adenopathy.  Neurological:     Mental Status: She is alert and oriented to person, place, and time.  Psychiatric:        Attention and Perception: Attention normal.        Mood and Affect: Mood normal.        Speech: Speech normal.        Behavior: Behavior normal. Behavior is cooperative.        Thought Content: Thought content normal.        Judgment: Judgment normal.    Results for orders placed or performed in visit on 05/06/22  CBC with Differential/Platelet  Result Value Ref Range   WBC 4.3 3.4 - 10.8 x10E3/uL   RBC 4.36 3.77 - 5.28 x10E6/uL   Hemoglobin 13.1 11.1 - 15.9 g/dL   Hematocrit 82.9 56.2 - 46.6 %   MCV 93 79 - 97 fL   MCH 30.0 26.6 - 33.0 pg   MCHC 32.3 31.5 - 35.7 g/dL   RDW 13.0 86.5 - 78.4 %   Platelets 232 150 - 450 x10E3/uL   Neutrophils 52 Not Estab. %   Lymphs 30 Not Estab. %   Monocytes 14 Not Estab. %   Eos 3 Not Estab. %   Basos 1 Not Estab. %   Neutrophils Absolute 2.3 1.4 - 7.0 x10E3/uL   Lymphocytes Absolute 1.3 0.7 - 3.1 x10E3/uL   Monocytes Absolute 0.6 0.1 - 0.9 x10E3/uL   EOS (ABSOLUTE) 0.1 0.0 - 0.4 x10E3/uL   Basophils Absolute 0.0 0.0 - 0.2 x10E3/uL   Immature Granulocytes 0 Not Estab. %   Immature Grans (Abs) 0.0 0.0 - 0.1 x10E3/uL  Comprehensive metabolic panel  Result Value Ref Range   Glucose 99 70 - 99 mg/dL   BUN 12 8 - 27 mg/dL   Creatinine, Ser 6.96 0.57 - 1.00 mg/dL   eGFR 77 >29 BM/WUX/3.24   BUN/Creatinine Ratio 15 12 - 28   Sodium 140 134 - 144 mmol/L   Potassium 3.8 3.5 - 5.2 mmol/L   Chloride 104  96 - 106 mmol/L   CO2 22 20 - 29 mmol/L   Calcium 9.1 8.7 - 10.3 mg/dL   Total Protein 6.4 6.0 - 8.5 g/dL   Albumin 3.9 3.8 - 4.8 g/dL   Globulin, Total 2.5 1.5 - 4.5 g/dL   Albumin/Globulin Ratio 1.6 1.2 - 2.2   Bilirubin Total 0.3 0.0 - 1.2 mg/dL   Alkaline Phosphatase 132 (H) 44 - 121 IU/L   AST 18 0 - 40 IU/L   ALT 16 0 - 32 IU/L  Lipid Panel w/o Chol/HDL Ratio  Result Value Ref Range   Cholesterol, Total 175 100 - 199 mg/dL   Triglycerides 46 0 - 149 mg/dL   HDL 56 >40 mg/dL   VLDL Cholesterol Cal 9 5 - 40 mg/dL   LDL Chol Calc (NIH) 102 (H) 0 - 99 mg/dL  TSH  Result Value Ref Range   TSH 1.860 0.450 - 4.500 uIU/mL  Gamma GT  Result Value Ref Range   GGT 7 0 - 60 IU/L      Assessment & Plan:   Problem List Items Addressed This Visit       Cardiovascular and Mediastinum   Hypertension    Chronic, stable.  BP at goal in office today and on home checks.  Will continue Amlodipine 5 MG  daily and if elevations noted in upcoming visits will increase to 10 MG.  Remain off HCTZ due to urinary urgency with this in past.  LABS: CMP.  Focus on DASH diet and regular exercise at home.  Monitor BP daily.  Return in 6 months for follow-up.      Relevant Medications   amLODipine (NORVASC) 5 MG tablet   rosuvastatin (CRESTOR) 20 MG tablet   Other Relevant Orders   Comprehensive metabolic panel   CBC with Differential/Platelet     Digestive   Acid reflux    New onset over past months.  No medications at this time.  Recommend she trial OTC Pepcid 20 MG as needed or on daily basis dependent on needs and symptoms.  Educated her on this.  Discussed diet changes and focus on reduction of trigger foods.        Musculoskeletal and Integument   Rheumatoid arthritis, seropositive (HCC) - Primary    Chronic, ongoing.  Continue collaboration with rheumatology + current medication regimen.  Recent notes and labs reviewed.  Check CMP today.        Other   Elevated alkaline phosphatase  level    Recheck on labs today and if consistent trend upwards obtain u/s and refer to GI.      Relevant Orders   Comprehensive metabolic panel   Hyperlipidemia    Chronic, ongoing.  Continue current medication regimen and adjust as needed.  Lipid panel today.      Relevant Medications   amLODipine (NORVASC) 5 MG tablet   rosuvastatin (CRESTOR) 20 MG tablet   Other Relevant Orders   Comprehensive metabolic panel   Lipid Panel w/o Chol/HDL Ratio   Long term methotrexate user    Check CMP and CBC today -- continue collaboration with rheumatology, recent notes reviewed.      Relevant Orders   Comprehensive metabolic panel   Obesity    BMI 32.91.  Recommended eating smaller high protein, low fat meals more frequently and exercising 30 mins a day 5 times a week with a goal of 10-15lb weight loss in the next 3 months. Patient voiced their understanding and motivation to adhere to these recommendations.         Follow up plan: Return in about 6 months (around 05/07/2023) for RA, HTN/HLD.

## 2022-11-05 NOTE — Assessment & Plan Note (Signed)
Chronic, ongoing.  Continue collaboration with rheumatology + current medication regimen.  Recent notes and labs reviewed.  Check CMP today. 

## 2022-11-05 NOTE — Assessment & Plan Note (Signed)
Chronic, stable.  BP at goal in office today and on home checks.  Will continue Amlodipine 5 MG daily and if elevations noted in upcoming visits will increase to 10 MG.  Remain off HCTZ due to urinary urgency with this in past.  LABS: CMP.  Focus on DASH diet and regular exercise at home.  Monitor BP daily.  Return in 6 months for follow-up.

## 2022-11-05 NOTE — Assessment & Plan Note (Signed)
Recheck on labs today and if consistent trend upwards obtain u/s and refer to GI. 

## 2022-11-05 NOTE — Assessment & Plan Note (Addendum)
BMI 32.91.  Recommended eating smaller high protein, low fat meals more frequently and exercising 30 mins a day 5 times a week with a goal of 10-15lb weight loss in the next 3 months. Patient voiced their understanding and motivation to adhere to these recommendations.

## 2022-11-05 NOTE — Assessment & Plan Note (Signed)
Check CMP and CBC today -- continue collaboration with rheumatology, recent notes reviewed.

## 2022-11-06 LAB — COMPREHENSIVE METABOLIC PANEL
ALT: 15 IU/L (ref 0–32)
AST: 17 IU/L (ref 0–40)
Albumin: 4 g/dL (ref 3.8–4.8)
Alkaline Phosphatase: 133 IU/L — ABNORMAL HIGH (ref 44–121)
BUN/Creatinine Ratio: 14 (ref 12–28)
BUN: 12 mg/dL (ref 8–27)
Bilirubin Total: 0.4 mg/dL (ref 0.0–1.2)
CO2: 24 mmol/L (ref 20–29)
Calcium: 9.2 mg/dL (ref 8.7–10.3)
Chloride: 106 mmol/L (ref 96–106)
Creatinine, Ser: 0.83 mg/dL (ref 0.57–1.00)
Globulin, Total: 2.6 g/dL (ref 1.5–4.5)
Glucose: 91 mg/dL (ref 70–99)
Potassium: 4.1 mmol/L (ref 3.5–5.2)
Sodium: 145 mmol/L — ABNORMAL HIGH (ref 134–144)
Total Protein: 6.6 g/dL (ref 6.0–8.5)
eGFR: 74 mL/min/{1.73_m2} (ref >59)

## 2022-11-06 LAB — CBC WITH DIFFERENTIAL/PLATELET
Basophils Absolute: 0 10*3/uL (ref 0.0–0.2)
Basos: 1 %
EOS (ABSOLUTE): 0.1 10*3/uL (ref 0.0–0.4)
Eos: 1 %
Hematocrit: 40.4 % (ref 34.0–46.6)
Hemoglobin: 13.3 g/dL (ref 11.1–15.9)
Immature Grans (Abs): 0 10*3/uL (ref 0.0–0.1)
Immature Granulocytes: 0 %
Lymphocytes Absolute: 1.7 10*3/uL (ref 0.7–3.1)
Lymphs: 34 %
MCH: 30.4 pg (ref 26.6–33.0)
MCHC: 32.9 g/dL (ref 31.5–35.7)
MCV: 92 fL (ref 79–97)
Monocytes Absolute: 0.5 10*3/uL (ref 0.1–0.9)
Monocytes: 9 %
Neutrophils Absolute: 2.7 10*3/uL (ref 1.4–7.0)
Neutrophils: 55 %
Platelets: 169 10*3/uL (ref 150–450)
RBC: 4.37 x10E6/uL (ref 3.77–5.28)
RDW: 15 % (ref 11.7–15.4)
WBC: 4.9 10*3/uL (ref 3.4–10.8)

## 2022-11-06 LAB — LIPID PANEL W/O CHOL/HDL RATIO
Cholesterol, Total: 184 mg/dL (ref 100–199)
HDL: 78 mg/dL (ref >39)
LDL Chol Calc (NIH): 96 mg/dL (ref 0–99)
Triglycerides: 52 mg/dL (ref 0–149)
VLDL Cholesterol Cal: 10 mg/dL (ref 5–40)

## 2022-11-07 ENCOUNTER — Other Ambulatory Visit: Payer: Self-pay | Admitting: Nurse Practitioner

## 2022-11-07 NOTE — Progress Notes (Signed)
Good morning, please ensure patient has lab results below.  I sent message in MyChart, but it appears she does not check this consistently:  Good morning Angela Watts, your labs have returned: - Kidney function, creatinine and eGFR, remains normal, as is liver function, AST and ALT.  - Sodium level is mildly elevated, I recommend cut back a little on salt and increase your water intake. - Cholesterol levels are trending down, but not completely at goal.  We will continue current medication dosing and adjust as needed.  Any questions? Keep being amazing!!  Thank you for allowing me to participate in your care.  I appreciate you. Kindest regards, Jarid Sasso

## 2022-11-12 DIAGNOSIS — Z79899 Other long term (current) drug therapy: Secondary | ICD-10-CM | POA: Diagnosis not present

## 2022-11-12 DIAGNOSIS — M059 Rheumatoid arthritis with rheumatoid factor, unspecified: Secondary | ICD-10-CM | POA: Diagnosis not present

## 2022-11-12 DIAGNOSIS — M170 Bilateral primary osteoarthritis of knee: Secondary | ICD-10-CM | POA: Diagnosis not present

## 2022-11-12 DIAGNOSIS — L409 Psoriasis, unspecified: Secondary | ICD-10-CM | POA: Diagnosis not present

## 2022-12-27 ENCOUNTER — Other Ambulatory Visit: Payer: Self-pay | Admitting: Nurse Practitioner

## 2022-12-27 DIAGNOSIS — Z1231 Encounter for screening mammogram for malignant neoplasm of breast: Secondary | ICD-10-CM

## 2023-02-04 ENCOUNTER — Ambulatory Visit
Admission: RE | Admit: 2023-02-04 | Discharge: 2023-02-04 | Disposition: A | Discharge status: Home / Self Care | Payer: Medicare Other | Source: Ambulatory Visit | Attending: Nurse Practitioner | Admitting: Nurse Practitioner

## 2023-02-04 DIAGNOSIS — Z1231 Encounter for screening mammogram for malignant neoplasm of breast: Secondary | ICD-10-CM | POA: Insufficient documentation

## 2023-02-22 DIAGNOSIS — Z23 Encounter for immunization: Secondary | ICD-10-CM | POA: Diagnosis not present

## 2023-03-18 DIAGNOSIS — L409 Psoriasis, unspecified: Secondary | ICD-10-CM | POA: Diagnosis not present

## 2023-03-18 DIAGNOSIS — M170 Bilateral primary osteoarthritis of knee: Secondary | ICD-10-CM | POA: Diagnosis not present

## 2023-03-18 DIAGNOSIS — M059 Rheumatoid arthritis with rheumatoid factor, unspecified: Secondary | ICD-10-CM | POA: Diagnosis not present

## 2023-03-18 DIAGNOSIS — Z79899 Other long term (current) drug therapy: Secondary | ICD-10-CM | POA: Diagnosis not present

## 2023-03-22 DIAGNOSIS — Z23 Encounter for immunization: Secondary | ICD-10-CM | POA: Diagnosis not present

## 2023-04-13 ENCOUNTER — Other Ambulatory Visit: Payer: Self-pay | Admitting: Nurse Practitioner

## 2023-04-13 NOTE — Telephone Encounter (Signed)
Requested Prescriptions  Pending Prescriptions Disp Refills   rosuvastatin (CRESTOR) 20 MG tablet [Pharmacy Med Name: Rosuvastatin Calcium Oral Tablet 20 MG] 39 tablet 3    Sig: TAKE 1 TABLET THREE TIMES WEEKLY ON MONDAY, WEDNESDAY, AND FRIDAY AT 6 PM     Cardiovascular:  Antilipid - Statins 2 Failed - 04/13/2023 10:32 AM      Failed - Lipid Panel in normal range within the last 12 months    Cholesterol, Total  Date Value Ref Range Status  11/05/2022 184 100 - 199 mg/dL Final   Cholesterol Piccolo, Waived  Date Value Ref Range Status  11/02/2018 138 <200 mg/dL Final    Comment:                            Desirable                <200                         Borderline High      200- 239                         High                     >239    LDL Chol Calc (NIH)  Date Value Ref Range Status  11/05/2022 96 0 - 99 mg/dL Final   HDL  Date Value Ref Range Status  11/05/2022 78 >39 mg/dL Final   Triglycerides  Date Value Ref Range Status  11/05/2022 52 0 - 149 mg/dL Final   Triglycerides Piccolo,Waived  Date Value Ref Range Status  11/02/2018 49 <150 mg/dL Final    Comment:                            Normal                   <150                         Borderline High     150 - 199                         High                200 - 499                         Very High                >499          Passed - Cr in normal range and within 360 days    Creatinine, Ser  Date Value Ref Range Status  11/05/2022 0.83 0.57 - 1.00 mg/dL Final         Passed - Patient is not pregnant      Passed - Valid encounter within last 12 months    Recent Outpatient Visits           5 months ago Rheumatoid arthritis, seropositive (HCC)   [place] [facility] [place], Dorie Rank, NP   11 months ago Medicare annual wellness visit, subsequent   [place] [facility] [place], [place] T, NP   1  year ago Rheumatoid arthritis, seropositive (HCC)   [place]  [facility] [place], [place] T, NP   1 year ago Rheumatoid arthritis, seropositive (HCC)   [place] [facility] [place], [place] T, NP   2 years ago Class 2 severe obesity due to excess calories with serious comorbidity and body mass index (BMI) of 36.0 to 36.9 in adult [facility])   [place] [facility] Marjie Skiff, NP       Future Appointments             In 3 weeks [name], Dorie Rank, NP [place] [facility], [facility]

## 2023-05-07 NOTE — Patient Instructions (Signed)
Be Involved in Caring For Your Health:  Taking Medications When medications are taken as directed, they can greatly improve your health. But if they are not taken as prescribed, they may not work. In some cases, not taking them correctly can be harmful. To help ensure your treatment remains effective and safe, understand your medications and how to take them. Bring your medications to each visit for review by your provider.  Your lab results, notes, and after visit summary will be available on My Chart. We strongly encourage you to use this feature. If lab results are abnormal the clinic will contact you with the appropriate steps. If the clinic does not contact you assume the results are satisfactory. You can always view your results on My Chart. If you have questions regarding your health or results, please contact the clinic during office hours. You can also ask questions on My Chart.  We at [facility] are grateful that you chose [place] to provide your care. We strive to provide evidence-based and compassionate care and are always looking for feedback. If you get a survey from the clinic please complete this so we can hear your opinions.  Heart-Healthy Eating Plan Many factors influence your heart health, including eating and exercise habits. Heart health is also called coronary health. Coronary risk increases with abnormal blood fat (lipid) levels. A heart-healthy eating plan includes limiting unhealthy fats, increasing healthy fats, limiting salt (sodium) intake, and making other diet and lifestyle changes. What is my plan? Your health care provider may recommend that: You limit your fat intake to _________% or less of your total calories each day. You limit your saturated fat intake to _________% or less of your total calories each day. You limit the amount of cholesterol in your diet to less than _________ mg per day. You limit the amount of sodium in your diet to less than _________  mg per day. What are tips for following this plan? Cooking Cook foods using methods other than frying. Baking, boiling, grilling, and broiling are all good options. Other ways to reduce fat include: Removing the skin from poultry. Removing all visible fats from meats. Steaming vegetables in water or broth. Meal planning  At meals, imagine dividing your plate into fourths: Fill one-half of your plate with vegetables and green salads. Fill one-fourth of your plate with whole grains. Fill one-fourth of your plate with lean protein foods. Eat 2-4 cups of vegetables per day. One cup of vegetables equals 1 cup (91 g) broccoli or cauliflower florets, 2 medium carrots, 1 large bell pepper, 1 large sweet potato, 1 large tomato, 1 medium white potato, 2 cups (150 g) raw leafy greens. Eat 1-2 cups of fruit per day. One cup of fruit equals 1 small apple, 1 large banana, 1 cup (237 g) mixed fruit, 1 large orange,  cup (82 g) dried fruit, 1 cup (240 mL) 100% fruit juice. Eat more foods that contain soluble fiber. Examples include apples, broccoli, carrots, beans, peas, and barley. Aim to get 25-30 g of fiber per day. Increase your consumption of legumes, nuts, and seeds to 4-5 servings per week. One serving of dried beans or legumes equals  cup (90 g) cooked, 1 serving of nuts is  oz (12 almonds, 24 pistachios, or 7 walnut halves), and 1 serving of seeds equals  oz (8 g). Fats Choose healthy fats more often. Choose monounsaturated and polyunsaturated fats, such as olive and canola oils, avocado oil, flaxseeds, walnuts, almonds, and seeds. Eat  more omega-3 fats. Choose salmon, mackerel, sardines, tuna, flaxseed oil, and ground flaxseeds. Aim to eat fish at least 2 times each week. Check food labels carefully to identify foods with trans fats or high amounts of saturated fat. Limit saturated fats. These are found in animal products, such as meats, butter, and cream. Plant sources of saturated fats  include palm oil, palm kernel oil, and coconut oil. Avoid foods with partially hydrogenated oils in them. These contain trans fats. Examples are stick margarine, some tub margarines, cookies, crackers, and other baked goods. Avoid fried foods. General information Eat more home-cooked food and less restaurant, buffet, and fast food. Limit or avoid alcohol. Limit foods that are high in added sugar and simple starches such as foods made using white refined flour (white breads, pastries, sweets). Lose weight if you are overweight. Losing just 5-10% of your body weight can help your overall health and prevent diseases such as diabetes and heart disease. Monitor your sodium intake, especially if you have high blood pressure. Talk with your health care provider about your sodium intake. Try to incorporate more vegetarian meals weekly. What foods should I eat? Fruits All fresh, canned (in natural juice), or frozen fruits. Vegetables Fresh or frozen vegetables (raw, steamed, roasted, or grilled). Green salads. Grains Most grains. Choose whole wheat and whole grains most of the time. Rice and pasta, including brown rice and pastas made with whole wheat. Meats and other proteins Lean, well-trimmed beef, veal, pork, and lamb. Chicken and [place] without skin. All fish and shellfish. Wild duck, rabbit, pheasant, and venison. Egg whites or low-cholesterol egg substitutes. Dried beans, peas, lentils, and tofu. Seeds and most nuts. Dairy Low-fat or nonfat cheeses, including ricotta and mozzarella. Skim or 1% milk (liquid, powdered, or evaporated). Buttermilk made with low-fat milk. Nonfat or low-fat yogurt. Fats and oils Non-hydrogenated (trans-free) margarines. Vegetable oils, including soybean, sesame, sunflower, olive, avocado, peanut, safflower, corn, canola, and cottonseed. Salad dressings or mayonnaise made with a vegetable oil. Beverages Water (mineral or sparkling). Coffee and tea. Unsweetened ice  tea. Diet beverages. Sweets and desserts Sherbet, gelatin, and fruit ice. Small amounts of dark chocolate. Limit all sweets and desserts. Seasonings and condiments All seasonings and condiments. The items listed above may not be a complete list of foods and beverages you can eat. Contact a dietitian for more options. What foods should I avoid? Fruits Canned fruit in heavy syrup. Fruit in cream or butter sauce. Fried fruit. Limit coconut. Vegetables Vegetables cooked in cheese, cream, or butter sauce. Fried vegetables. Grains Breads made with saturated or trans fats, oils, or whole milk. Croissants. Sweet rolls. Donuts. High-fat crackers, such as cheese crackers and chips. Meats and other proteins Fatty meats, such as hot dogs, ribs, sausage, bacon, rib-eye roast or steak. High-fat deli meats, such as salami and bologna. Caviar. Domestic duck and goose. Organ meats, such as liver. Dairy Cream, sour cream, cream cheese, and creamed cottage cheese. Whole-milk cheeses. Whole or 2% milk (liquid, evaporated, or condensed). Whole buttermilk. Cream sauce or high-fat cheese sauce. Whole-milk yogurt. Fats and oils Meat fat, or shortening. Cocoa butter, hydrogenated oils, palm oil, coconut oil, palm kernel oil. Solid fats and shortenings, including bacon fat, salt pork, lard, and butter. Nondairy cream substitutes. Salad dressings with cheese or sour cream. Beverages Regular sodas and any drinks with added sugar. Sweets and desserts Frosting. Pudding. Cookies. Cakes. Pies. Milk chocolate or white chocolate. Buttered syrups. Full-fat ice cream or ice cream drinks. The items listed above may  not be a complete list of foods and beverages to avoid. Contact a dietitian for more information. Summary Heart-healthy meal planning includes limiting unhealthy fats, increasing healthy fats, limiting salt (sodium) intake and making other diet and lifestyle changes. Lose weight if you are overweight. Losing just  5-10% of your body weight can help your overall health and prevent diseases such as diabetes and heart disease. Focus on eating a balance of foods, including fruits and vegetables, low-fat or nonfat dairy, lean protein, nuts and legumes, whole grains, and heart-healthy oils and fats. This information is not intended to replace advice given to you by your health care provider. Make sure you discuss any questions you have with your health care provider. Document Revised: 06/08/2021 Document Reviewed: 06/08/2021 Elsevier Patient Education  2024 [employer].

## 2023-05-09 ENCOUNTER — Ambulatory Visit (INDEPENDENT_AMBULATORY_CARE_PROVIDER_SITE_OTHER): Payer: Medicare Other | Admitting: Nurse Practitioner

## 2023-05-09 ENCOUNTER — Encounter: Payer: Self-pay | Admitting: Nurse Practitioner

## 2023-05-09 ENCOUNTER — Telehealth: Payer: Self-pay | Admitting: Nurse Practitioner

## 2023-05-09 VITALS — BP 128/64 | HR 80 | Temp 97.9°F | Ht 67.0 in | Wt 199.2 lb

## 2023-05-09 DIAGNOSIS — Z79631 Long term (current) use of antimetabolite agent: Secondary | ICD-10-CM

## 2023-05-09 DIAGNOSIS — M059 Rheumatoid arthritis with rheumatoid factor, unspecified: Secondary | ICD-10-CM

## 2023-05-09 DIAGNOSIS — R748 Abnormal levels of other serum enzymes: Secondary | ICD-10-CM

## 2023-05-09 DIAGNOSIS — R413 Other amnesia: Secondary | ICD-10-CM

## 2023-05-09 DIAGNOSIS — K219 Gastro-esophageal reflux disease without esophagitis: Secondary | ICD-10-CM

## 2023-05-09 DIAGNOSIS — Z6835 Body mass index (BMI) 35.0-35.9, adult: Secondary | ICD-10-CM | POA: Diagnosis not present

## 2023-05-09 DIAGNOSIS — Z0000 Encounter for general adult medical examination without abnormal findings: Secondary | ICD-10-CM

## 2023-05-09 DIAGNOSIS — E7800 Pure hypercholesterolemia, unspecified: Secondary | ICD-10-CM

## 2023-05-09 DIAGNOSIS — I10 Essential (primary) hypertension: Secondary | ICD-10-CM

## 2023-05-09 DIAGNOSIS — E66812 Obesity, class 2: Secondary | ICD-10-CM

## 2023-05-09 DIAGNOSIS — D242 Benign neoplasm of left breast: Secondary | ICD-10-CM

## 2023-05-09 NOTE — Assessment & Plan Note (Addendum)
Ongoing.  Recheck on labs today and if consistent trend upwards obtain u/s and refer to GI.  Has gall bladder.

## 2023-05-09 NOTE — Assessment & Plan Note (Signed)
Chronic, ongoing.  Continue current medication regimen and adjust as needed. Lipid panel today. 

## 2023-05-09 NOTE — Assessment & Plan Note (Signed)
Chronic, stable with occasional Pepcid use.  She is aware if ever any changes in chest discomfort to immediately see provider.

## 2023-05-09 NOTE — Assessment & Plan Note (Signed)
Chronic, ongoing.  Continue collaboration with rheumatology + current medication regimen.  Recent notes and labs reviewed.  Check CMP today.

## 2023-05-09 NOTE — Assessment & Plan Note (Signed)
Chronic, stable.  BP at goal in office today and on home checks.  Will continue Amlodipine 5 MG daily and if elevations noted in upcoming visits will increase to 10 MG.  Remain off HCTZ due to urinary urgency with this in past.  LABS: CMP, CBC, TSH.  Focus on DASH diet and regular exercise at home.  Monitor BP daily.  Return in 6 months for follow-up.

## 2023-05-09 NOTE — Progress Notes (Signed)
BP 128/64 (BP Location: Left Arm, Patient Position: Sitting)   Pulse 80   Temp 97.9 F (36.6 C)   Ht 5\' 7"  (1.702 m)   Wt 199 lb 3.2 oz (90.4 kg)   LMP  (LMP Unknown)   SpO2 97%   BMI 31.20 kg/m    Subjective:    Patient ID: Angela Watts, female    DOB: 02/01/1951, 72 y.o.   MRN: [id number]  HPI: Angela Watts is a 72 y.o. female  Chief Complaint  Patient presents with   Hypertension   Hyperlipidemia   Rheumatoid Arthritis   Memory Changes    Patient says she is having a coming and going issues with her memory. Patient says she notices she will tend to forget some things.    HYPERTENSION / HYPERLIPIDEMIA Taking Amlodipine and Crestor (three times weekly).  Reports having memory issues for awhile, her daughter reports it has been present for awhile.  Will forget names and repeat self often.  No change in routine at home.  Can get from A to B without issue.  History of dementia in family, paternal aunt and maternal uncle.  Parents had no memory issues.   Has history of mild elevation ALK PHOS, ongoing, last 133.  Denies any abdominal pain or N&V.  Still has gall bladder. Satisfied with current treatment? yes Duration of hypertension: chronic BP monitoring frequency: a few times a month BP range: 130/70-80 BP medication side effects: no Duration of hyperlipidemia: chronic Cholesterol medication side effects: no Cholesterol supplements: none Medication compliance: excellent compliance Aspirin: no Recent stressors: no Recurrent headaches: no Visual changes: no Palpitations: no Dyspnea: with exertion at times Chest pain: occasional after eating with burning, Pepcid helps Lower extremity edema: no Dizzy/lightheaded: no     05/09/2023    9:49 AM 03/28/2020   11:21 AM 03/26/2019   11:12 AM 03/23/2018   10:20 AM 03/21/2017   10:57 AM  6CIT Screen  What Year? 0 points 0 points 0 points 0 points 0 points  What month? 0 points 0 points 0 points 0 points 0 points   What time? 0 points 0 points 0 points 0 points 0 points  Count back from 20 0 points 2 points 0 points 0 points 0 points  Months in reverse 0 points 0 points 0 points 0 points 0 points  Repeat phrase 4 points 0 points 0 points 4 points 0 points  Total Score 4 points 2 points 0 points 4 points 0 points   RHEUMATOID ARTHRITIS Follows with Dr. Allena Katz and last seen 03/18/23 -- no changes made.  She is to continue Methotrexate 7 tablets (17.5 MG) once a week and daily folic acid. This overall helps discomfort, but has some days where is more sore. Duration: months Pain: yes Symmetric: yes  mild Quality: aching and throbbing Frequency: intermittent Context:  stable Decreased function/range of motion: no Erythema: no Swelling: no Heat or warmth: no Morning stiffness: yes  Aggravating factors: None Alleviating factors: Improves with movement Relief with NSAIDs?: No NSAIDs Taken Treatments attempted:  none Involved Joints:     Hands: yes, bilateral    Wrists: no     Elbows: no     Shoulders: yes    Back: yes, occasional    Hips: no     Knees: yes bilateral    Ankles: no     Feet: no      05/09/2023    9:48 AM 11/05/2022    9:39 AM 05/06/2022  9:32 AM 11/04/2021    9:12 AM 03/30/2021   11:35 AM  Depression screen PHQ 2/9  Decreased Interest 0 0 0 1 0  Down, Depressed, Hopeless 0 1 0 1 0  PHQ - 2 Score 0 1 0 2 0  Altered sleeping 0 1 0 1   Tired, decreased energy 0 1 0 2   Change in appetite 0 0 0 0   Feeling bad or failure about yourself  0 0 0 1   Trouble concentrating 0 0 0 0   Moving slowly or fidgety/restless 0 1 0 0   Suicidal thoughts 0 0 0 0   PHQ-9 Score 0 4 0 6   Difficult doing work/chores Not difficult at all  Not difficult at all Not difficult at all        05/09/2023    9:49 AM 11/05/2022    9:39 AM 05/06/2022    9:32 AM 11/04/2021    9:13 AM  GAD 7 : Generalized Anxiety Score  Nervous, Anxious, on Edge 0 1 0 1  Control/stop worrying 0 1 0 1  Worry too  much - different things 0 1 0 1  Trouble relaxing 0 0 0 0  Restless 0 0 0 0  Easily annoyed or irritable 0 1 0 1  Afraid - awful might happen 0 1 0 0  Total GAD 7 Score 0 5 0 4  Anxiety Difficulty Not difficult at all Somewhat difficult Not difficult at all Not difficult at all   Relevant past medical, surgical, family and social history reviewed and updated as indicated. Interim medical history since our last visit reviewed. Allergies and medications reviewed and updated.  Review of Systems  Constitutional:  Negative for chills, fatigue and fever.  HENT:  Negative for facial swelling, sinus pressure and sinus pain.   Eyes: Negative.   Respiratory:  Negative for chest tightness and shortness of breath.   Cardiovascular:  Negative for chest pain and leg swelling.  Gastrointestinal: Negative.   Endocrine: Negative for polydipsia, polyphagia and polyuria.  Genitourinary: Negative.   Musculoskeletal:  Positive for arthralgias.  Skin: Negative.   Allergic/Immunologic: Negative.   Neurological:  Negative for dizziness, weakness, numbness and headaches.  Hematological: Negative.   Psychiatric/Behavioral: Negative.     Per HPI unless specifically indicated above     Objective:    BP 128/64 (BP Location: Left Arm, Patient Position: Sitting)   Pulse 80   Temp 97.9 F (36.6 C)   Ht 5\' 7"  (1.702 m)   Wt 199 lb 3.2 oz (90.4 kg)   LMP  (LMP Unknown)   SpO2 97%   BMI 31.20 kg/m   Wt Readings from Last 3 Encounters:  05/09/23 199 lb 3.2 oz (90.4 kg)  11/05/22 210 lb 3.2 oz (95.3 kg)  05/06/22 215 lb 14.4 oz (97.9 kg)    Physical Exam Vitals and nursing note reviewed.  Constitutional:      General: She is awake. She is not in acute distress.    Appearance: She is well-developed and well-groomed. She is obese. She is not ill-appearing or toxic-appearing.  HENT:     Head: Normocephalic.     Right Ear: Hearing and external ear normal. No drainage.     Left Ear: Hearing and external  ear normal. No drainage.  Eyes:     General: Lids are normal.        Right eye: No discharge.        Left eye: No discharge.  Conjunctiva/sclera: Conjunctivae normal.  Neck:     Thyroid: No thyromegaly.     Vascular: No carotid bruit.  Cardiovascular:     Rate and Rhythm: Normal rate and regular rhythm.     Heart sounds: Normal heart sounds. No murmur heard.    No gallop.  Pulmonary:     Effort: Pulmonary effort is normal. No accessory muscle usage or respiratory distress.     Breath sounds: Normal breath sounds.  Abdominal:     General: Bowel sounds are normal. There is no distension.     Palpations: Abdomen is soft.     Tenderness: There is no abdominal tenderness.  Musculoskeletal:     Cervical back: Normal range of motion.     Right lower leg: No edema.     Left lower leg: No edema.  Lymphadenopathy:     Cervical: No cervical adenopathy.  Neurological:     Mental Status: She is alert and oriented to person, place, and time.     Cranial Nerves: Cranial nerves 2-12 are intact.     Motor: Motor function is intact.     Coordination: Coordination is intact.     Gait: Gait is intact.     Deep Tendon Reflexes: Reflexes are normal and symmetric.     Reflex Scores:      Brachioradialis reflexes are 2+ on the right side and 2+ on the left side.      Patellar reflexes are 2+ on the right side and 2+ on the left side.    Comments: Oriented to person, place, and time.  Psychiatric:        Attention and Perception: Attention normal.        Mood and Affect: Mood normal.        Speech: Speech normal.        Behavior: Behavior normal. Behavior is cooperative.        Thought Content: Thought content normal.        Judgment: Judgment normal.    Results for orders placed or performed in visit on 11/05/22  Comprehensive metabolic panel   Collection Time: 11/05/22  9:59 AM  Result Value Ref Range   Glucose 91 70 - 99 mg/dL   BUN 12 8 - 27 mg/dL   Creatinine, Ser 1.91 0.57 - 1.00  mg/dL   eGFR 74 >47 WG/NFA/2.13   BUN/Creatinine Ratio 14 12 - 28   Sodium 145 (H) 134 - 144 mmol/L   Potassium 4.1 3.5 - 5.2 mmol/L   Chloride 106 96 - 106 mmol/L   CO2 24 20 - 29 mmol/L   Calcium 9.2 8.7 - 10.3 mg/dL   Total Protein 6.6 6.0 - 8.5 g/dL   Albumin 4.0 3.8 - 4.8 g/dL   Globulin, Total 2.6 1.5 - 4.5 g/dL   Bilirubin Total 0.4 0.0 - 1.2 mg/dL   Alkaline Phosphatase 133 (H) 44 - 121 IU/L   AST 17 0 - 40 IU/L   ALT 15 0 - 32 IU/L  CBC with Differential/Platelet   Collection Time: 11/05/22  9:59 AM  Result Value Ref Range   WBC 4.9 3.4 - 10.8 x10E3/uL   RBC 4.37 3.77 - 5.28 x10E6/uL   Hemoglobin 13.3 11.1 - 15.9 g/dL   Hematocrit 08.6 57.8 - 46.6 %   MCV 92 79 - 97 fL   MCH 30.4 26.6 - 33.0 pg   MCHC 32.9 31.5 - 35.7 g/dL   RDW 46.9 62.9 - 52.8 %   Platelets 169 150 -  450 x10E3/uL   Neutrophils 55 Not Estab. %   Lymphs 34 Not Estab. %   Monocytes 9 Not Estab. %   Eos 1 Not Estab. %   Basos 1 Not Estab. %   Neutrophils Absolute 2.7 1.4 - 7.0 x10E3/uL   Lymphocytes Absolute 1.7 0.7 - 3.1 x10E3/uL   Monocytes Absolute 0.5 0.1 - 0.9 x10E3/uL   EOS (ABSOLUTE) 0.1 0.0 - 0.4 x10E3/uL   Basophils Absolute 0.0 0.0 - 0.2 x10E3/uL   Immature Granulocytes 0 Not Estab. %   Immature Grans (Abs) 0.0 0.0 - 0.1 x10E3/uL  Lipid Panel w/o Chol/HDL Ratio   Collection Time: 11/05/22  9:59 AM  Result Value Ref Range   Cholesterol, Total 184 100 - 199 mg/dL   Triglycerides 52 0 - 149 mg/dL   HDL 78 >25 mg/dL   VLDL Cholesterol Cal 10 5 - 40 mg/dL   LDL Chol Calc (NIH) 96 0 - 99 mg/dL      Assessment & Plan:   Problem List Items Addressed This Visit       Cardiovascular and Mediastinum   Hypertension   Chronic, stable.  BP at goal in office today and on home checks.  Will continue Amlodipine 5 MG daily and if elevations noted in upcoming visits will increase to 10 MG.  Remain off HCTZ due to urinary urgency with this in past.  LABS: CMP, CBC, TSH.  Focus on DASH diet and  regular exercise at home.  Monitor BP daily.  Return in 6 months for follow-up.      Relevant Orders   Comprehensive metabolic panel   TSH     Digestive   Acid reflux   Chronic, stable with occasional Pepcid use.  She is aware if ever any changes in chest discomfort to immediately see provider.      Relevant Medications   famotidine (PEPCID) 20 MG tablet     Musculoskeletal and Integument   Rheumatoid arthritis, seropositive (HCC) - Primary   Chronic, ongoing.  Continue collaboration with rheumatology + current medication regimen.  Recent notes and labs reviewed.  Check CMP today.        Other   Elevated alkaline phosphatase level   Ongoing.  Recheck on labs today and if consistent trend upwards obtain u/s and refer to GI.  Has gall bladder.      Relevant Orders   Gamma GT   Hyperlipidemia   Chronic, ongoing.  Continue current medication regimen and adjust as needed.  Lipid panel today.      Relevant Orders   Comprehensive metabolic panel   Lipid Panel w/o Chol/HDL Ratio   Long term methotrexate user   Check CMP and CBC today -- continue collaboration with rheumatology, recent notes reviewed.      Memory changes   Ongoing for some time per patient's daughter.  6CIT today 4.  This is similar to 2019 and close to previous.  Difficulty with short term recall of address, could not remember street name of zip code.  Remainder of check was stable.  Check B12, RPR, CBC, TSH today.  Will have return in 8 weeks for repeat check.  If any changes will obtain imaging.      Relevant Orders   RPR   Vitamin B12   CBC with Differential/Platelet   Obesity   BMI 31.20, praised for ongoing weight loss.  Recommended eating smaller high protein, low fat meals more frequently and exercising 30 mins a day 5 times a week with  a goal of 10-15lb weight loss in the next 3 months. Patient voiced their understanding and motivation to adhere to these recommendations.         Follow up  plan: Return in about 8 weeks (around 07/04/2023) for MEMORY CHANGES.

## 2023-05-09 NOTE — Telephone Encounter (Signed)
Copied from CRM [id number]. Topic: Medicare AWV >> May 09, 2023  9:50 AM Payton Doughty wrote: Reason for CRM: Called LVM 05/09/2023 to schedule AWV. Please schedule office or virtual visits.   Verlee Rossetti; Care Guide Ambulatory Clinical Support [place] [facility] Direct Dial: [phone]

## 2023-05-09 NOTE — Assessment & Plan Note (Signed)
Check CMP and CBC today -- continue collaboration with rheumatology, recent notes reviewed.

## 2023-05-09 NOTE — Assessment & Plan Note (Signed)
Ongoing for some time per patient's daughter.  6CIT today 4.  This is similar to 2019 and close to previous.  Difficulty with short term recall of address, could not remember street name of zip code.  Remainder of check was stable.  Check B12, RPR, CBC, TSH today.  Will have return in 8 weeks for repeat check.  If any changes will obtain imaging.

## 2023-05-09 NOTE — Assessment & Plan Note (Signed)
BMI 31.20, praised for ongoing weight loss.  Recommended eating smaller high protein, low fat meals more frequently and exercising 30 mins a day 5 times a week with a goal of 10-15lb weight loss in the next 3 months. Patient voiced their understanding and motivation to adhere to these recommendations.

## 2023-05-09 NOTE — Telephone Encounter (Signed)
Copied from CRM [id number]. Topic: Medicare AWV >> May 09, 2023  9:51 AM Payton Doughty wrote: Reason for CRM: Called LVM 05/09/2023 to schedule AWV. Please schedule office or virtual visits.  Verlee Rossetti; Care Guide Ambulatory Clinical Support [place] [facility] Direct Dial: [phone]

## 2023-05-10 LAB — LIPID PANEL W/O CHOL/HDL RATIO
Cholesterol, Total: 163 mg/dL (ref 100–199)
HDL: 65 mg/dL (ref >39)
LDL Chol Calc (NIH): 87 mg/dL (ref 0–99)
Triglycerides: 52 mg/dL (ref 0–149)
VLDL Cholesterol Cal: 11 mg/dL (ref 5–40)

## 2023-05-10 LAB — COMPREHENSIVE METABOLIC PANEL
ALT: 14 [IU]/L (ref 0–32)
AST: 16 [IU]/L (ref 0–40)
Albumin: 4 g/dL (ref 3.8–4.8)
Alkaline Phosphatase: 143 [IU]/L — ABNORMAL HIGH (ref 44–121)
BUN/Creatinine Ratio: 17 (ref 12–28)
BUN: 13 mg/dL (ref 8–27)
Bilirubin Total: 0.4 mg/dL (ref 0.0–1.2)
CO2: 24 mmol/L (ref 20–29)
Calcium: 9.2 mg/dL (ref 8.7–10.3)
Chloride: 105 mmol/L (ref 96–106)
Creatinine, Ser: 0.75 mg/dL (ref 0.57–1.00)
Globulin, Total: 2.8 g/dL (ref 1.5–4.5)
Glucose: 93 mg/dL (ref 70–99)
Potassium: 4 mmol/L (ref 3.5–5.2)
Sodium: 143 mmol/L (ref 134–144)
Total Protein: 6.8 g/dL (ref 6.0–8.5)
eGFR: 83 mL/min/{1.73_m2} (ref >59)

## 2023-05-10 LAB — VITAMIN B12: Vitamin B-12: 429 pg/mL (ref 232–1245)

## 2023-05-10 LAB — CBC WITH DIFFERENTIAL/PLATELET
Basophils Absolute: 0 10*3/uL (ref 0.0–0.2)
Basos: 1 %
EOS (ABSOLUTE): 0.1 10*3/uL (ref 0.0–0.4)
Eos: 1 %
Hematocrit: 41 % (ref 34.0–46.6)
Hemoglobin: 12.9 g/dL (ref 11.1–15.9)
Immature Grans (Abs): 0 10*3/uL (ref 0.0–0.1)
Immature Granulocytes: 1 %
Lymphocytes Absolute: 1.6 10*3/uL (ref 0.7–3.1)
Lymphs: 43 %
MCH: 29.8 pg (ref 26.6–33.0)
MCHC: 31.5 g/dL (ref 31.5–35.7)
MCV: 95 fL (ref 79–97)
Monocytes Absolute: 0.4 10*3/uL (ref 0.1–0.9)
Monocytes: 10 %
Neutrophils Absolute: 1.7 10*3/uL (ref 1.4–7.0)
Neutrophils: 44 %
Platelets: 284 10*3/uL (ref 150–450)
RBC: 4.33 x10E6/uL (ref 3.77–5.28)
RDW: 14.8 % (ref 11.7–15.4)
WBC: 3.7 10*3/uL (ref 3.4–10.8)

## 2023-05-10 LAB — TSH: TSH: 1.78 u[IU]/mL (ref 0.450–4.500)

## 2023-05-10 LAB — RPR: RPR Ser Ql: NONREACTIVE

## 2023-05-10 LAB — GAMMA GT: GGT: 8 [IU]/L (ref 0–60)

## 2023-05-10 NOTE — Progress Notes (Signed)
Good morning crew, please let Ms. Avena know her labs have returned and overall remain very stable.  Her alkaline phosphatase remains a bit elevated.  She has not had imaging of her liver and gall bladder in awhile, I would like to repeat this if she is okay with this.  Just want to ensure everything is still stable.  If okay with this, then I will order.  Any questions? Keep being stellar!!  Thank you for allowing me to participate in your care.  I appreciate you. Kindest regards, Olanrewaju Osborn

## 2023-05-12 NOTE — Addendum Note (Signed)
Addended by: Aura Dials T on: 05/12/2023 01:06 PM   Modules accepted: Orders

## 2023-05-23 ENCOUNTER — Ambulatory Visit
Admission: RE | Admit: 2023-05-23 | Discharge: 2023-05-23 | Disposition: A | Discharge status: Home / Self Care | Payer: Medicare Other | Source: Ambulatory Visit | Attending: Nurse Practitioner | Admitting: Nurse Practitioner

## 2023-05-23 DIAGNOSIS — R748 Abnormal levels of other serum enzymes: Secondary | ICD-10-CM | POA: Diagnosis not present

## 2023-05-23 DIAGNOSIS — N281 Cyst of kidney, acquired: Secondary | ICD-10-CM | POA: Diagnosis not present

## 2023-05-24 NOTE — Progress Notes (Signed)
 Good morning, please let Coda know her labs have returned and there is some fatty liver present, this is common finding due to our diets today.  I highly recommend heavy focus on healthy diet changes and regular exercise to help improve liver health.  We will continue to monitor labs at visits.  Any questions? Keep being amazing!!  Thank you for allowing me to participate in your care.  I appreciate you. Kindest regards, Kelso Bibby

## 2023-06-02 ENCOUNTER — Ambulatory Visit: Payer: Medicare Other | Admitting: Emergency Medicine

## 2023-06-02 VITALS — Ht 66.0 in | Wt 195.0 lb

## 2023-06-02 DIAGNOSIS — Z0000 Encounter for general adult medical examination without abnormal findings: Secondary | ICD-10-CM

## 2023-06-02 DIAGNOSIS — Z1211 Encounter for screening for malignant neoplasm of colon: Secondary | ICD-10-CM

## 2023-06-02 NOTE — Patient Instructions (Addendum)
[name] , Thank you for taking time to come for your Medicare Wellness Visit. I appreciate your ongoing commitment to your health goals. Please review the following plan we discussed and let me know if I can assist you in the future.   Referrals/Orders/Follow-Ups/Clinician Recommendations: I have placed a referral for gastroenterology at [facility]  to have a colonoscopy that is due approximately 07/15/23. Someone from their office will call you for an appointment. I have sent you a link to set up your MyChart.  This is a list of the screening recommended for you and due dates:  Health Maintenance  Topic Date Due   COVID-19 Vaccine (4 - 2024-25 season) 01/16/2023   Colon Cancer Screening  07/15/2023   Mammogram  02/04/2024   Medicare Annual Wellness Visit  06/01/2024   DTaP/Tdap/Td vaccine (3 - Td or Tdap) 03/17/2025   DEXA scan (bone density measurement)  05/05/2026   Pneumonia Vaccine  Completed   Flu Shot  Completed   Hepatitis C Screening  Completed   Zoster (Shingles) Vaccine  Completed   HPV Vaccine  Aged Out    Advanced directives: (Copy Requested) Please bring a copy of your health care power of attorney and living will to the office to be added to your chart at your convenience.  Next Medicare Annual Wellness Visit scheduled for next year: Yes, 06/14/24 @ 10:00am (video visit)

## 2023-06-02 NOTE — Progress Notes (Signed)
Subjective:   Angela Watts is a 73 y.o. female who presents for Medicare Annual (Subsequent) preventive examination.  This patient declined Interactive audio and Acupuncturist. Therefore the visit was completed with audio only.   Visit Complete: Virtual I connected with  [name] on 06/02/23 by a audio enabled telemedicine application and verified that I am speaking with the correct person using two identifiers.  Patient Location: Home  Provider Location: Home Office  I discussed the limitations of evaluation and management by telemedicine. The patient expressed understanding and agreed to proceed.  Vital Signs: Because this visit was a virtual/telehealth visit, some criteria may be missing or patient reported. Any vitals not documented were not able to be obtained and vitals that have been documented are patient reported.   Cardiac Risk Factors include: advanced age (>77men, >41 women);hypertension;dyslipidemia;obesity (BMI >30kg/m2)     Objective:    Today's Vitals   06/02/23 1001  Weight: 195 lb (88.5 kg)  Height: 5\' 6"  (1.676 m)   Body mass index is 31.47 kg/m.     06/02/2023   10:10 AM 03/28/2020   11:16 AM 03/26/2019   11:13 AM 07/14/2018    8:06 AM 03/23/2018   10:18 AM 03/21/2017   10:54 AM 09/18/2016   11:18 AM  Advanced Directives  Does Patient Have a Medical Advance Directive? Yes Yes Yes Yes No Yes No  Type of Estate agent of [place];Living will Healthcare Power of Attorney Living will;Healthcare Power of [employer] Power of [place];Living will   Does patient want to make changes to medical advance directive? No - Patient declined        Copy of Healthcare Power of Attorney in Chart? No - copy requested No - copy requested No - copy requested   No - copy requested   Would patient like information on creating a medical advance directive?     Yes (MAU/Ambulatory/Procedural Areas - Information given)       Current Medications (verified) Outpatient Encounter Medications as of 06/02/2023  Medication Sig   amLODipine (NORVASC) 5 MG tablet Take 1 tablet (5 mg total) by mouth daily.   famotidine (PEPCID) 20 MG tablet Take 20 mg by mouth daily as needed for heartburn or indigestion.   folic acid (FOLVITE) 1 MG tablet Take 1 tablet by mouth daily.   methotrexate (RHEUMATREX) 2.5 MG tablet Take 7 tabs (17.5 MG ) once a week for 12 weeks   Multiple Vitamin (MULTIVITAMIN) capsule Take 1 capsule by mouth daily.   rosuvastatin (CRESTOR) 20 MG tablet TAKE 1 TABLET THREE TIMES WEEKLY ON MONDAY, WEDNESDAY, AND FRIDAY AT 6 PM   No facility-administered encounter medications on file as of 06/02/2023.    Allergies (verified) Prednisone   History: Past Medical History:  Diagnosis Date   Abnormal Pap smear of cervix    Abscess of bursa, left hip    Arthritis    Colon adenomas    GERD (gastroesophageal reflux disease)    Hyperlipidemia    Hypertension    Psoriasis    Rheumatoid arthritis flare (HCC)    Past Surgical History:  Procedure Laterality Date   ABDOMINAL HYSTERECTOMY  2003   BREAST BIOPSY Left 06/01/2016   [place] bx done by dr byrnett 6:00 2cmfn intraducal papilloma with calcs   BREAST BIOPSY Left 02/05/2019   Affirm bx-"X" clip-benign parenchyma with fragments of sclerosed intraductal papilloma   BREAST BIOPSY Left 2012   [place] bx done by Dr. Lemar Livings,  intraductal papilloma   BREAST CYST ASPIRATION Left 2011   NEG/ Br Byrnett   BREAST CYST ASPIRATION Left 06/01/2016   dr byrnett 5:00 3cmfn aspirated with complete resolution   COLONOSCOPY WITH PROPOFOL N/A 06/11/2015   Procedure: COLONOSCOPY WITH PROPOFOL;  Surgeon: Scot Jun, MD;  Location: [facility] ENDOSCOPY;  Service: Endoscopy;  Laterality: N/A;   COLONOSCOPY WITH PROPOFOL N/A 07/14/2018   Procedure: COLONOSCOPY WITH PROPOFOL;  Surgeon: Scot Jun, MD;  Location[facility] ENDOSCOPY;  Service: Endoscopy;  Laterality: N/A;   CYST  EXCISION Left    breast   Family History  Problem Relation Age of Onset   Hypertension Mother    Arthritis Mother    Breast cancer Mother        5   Cancer Mother        breast   Hypertension Father    Lung cancer Father    Diabetes Sister    Hypertension Sister    Hypertension Sister    Hypertension Sister    Hypertension Sister    Hypertension Brother    Hypertension Brother    Cancer Brother        lung   Social History   Socioeconomic History   Marital status: Single    Spouse name: Not on file   Number of children: 1   Years of education: Not on file   Highest education level: Not on file  Occupational History   Not on file  Tobacco Use   Smoking status: Never   Smokeless tobacco: Never  Vaping Use   Vaping status: Never Used  Substance and Sexual Activity   Alcohol use: Yes    Comment: on occasion/socially, 1 wine or cocktail monthly or less   Drug use: No   Sexual activity: Never  Other Topics Concern   Not on file  Social History Narrative   Not on file   Social Drivers of Health   Financial Resource Strain: Low Risk  (06/02/2023)   Overall Financial Resource Strain (CARDIA)    Difficulty of Paying Living Expenses: Not hard at all  Food Insecurity: No Food Insecurity (06/02/2023)   Hunger Vital Sign    Worried About Running Out of Food in the Last Year: Never true    Ran Out of Food in the Last Year: Never true  Transportation Needs: No Transportation Needs (06/02/2023)   PRAPARE - Administrator, Civil Service (Medical): No    Lack of Transportation (Non-Medical): No  Physical Activity: Insufficiently Active (06/02/2023)   Exercise Vital Sign    Days of Exercise per Week: 5 days    Minutes of Exercise per Session: 20 min  Stress: No Stress Concern Present (06/02/2023)   [employer] of Occupational Health - Occupational Stress Questionnaire    Feeling of Stress : Not at all  Social Connections: Moderately Integrated (06/02/2023)    Social Connection and Isolation Panel [NHANES]    Frequency of Communication with Friends and Family: More than three times a week    Frequency of Social Gatherings with Friends and Family: More than three times a week    Attends Religious Services: More than 4 times per year    Active Member of [employer] or Organizations: Yes    Attends Engineer, structural: More than 4 times per year    Marital Status: Never married    Tobacco Counseling Counseling given: Not Answered   Clinical Intake:  Pre-visit preparation completed: Yes  Pain : No/denies  pain     BMI - recorded: 31.47 Nutritional Status: BMI > 30  Obese Nutritional Risks: None Diabetes: No  How often do you need to have someone help you when you read instructions, pamphlets, or other written materials from your doctor or pharmacy?: 1 - Never  Interpreter Needed?: No  Information entered by :: Tora Kindred, CMA   Activities of Daily Living    06/02/2023   10:02 AM  In your present state of health, do you have any difficulty performing the following activities:  Hearing? 0  Vision? 0  Difficulty concentrating or making decisions? 0  Walking or climbing stairs? 1  Dressing or bathing? 0  Doing errands, shopping? 0  Preparing Food and eating ? N  Using the Toilet? N  In the past six months, have you accidently leaked urine? N  Do you have problems with loss of bowel control? N  Managing your Medications? N  Managing your Finances? N  Housekeeping or managing your Housekeeping? N    Patient Care Team: Marjie Skiff, NP as PCP - General (Nurse Practitioner) Dorcas Carrow, DO as Referring Physician (Family Medicine) Lemar Livings Merrily Pew, MD (General Surgery) Dayna Barker, MD as Referring Physician (Internal Medicine)  Indicate any recent Medical Services you may have received from other than [facility] providers in the past year (date may be approximate).     Assessment:   This is a  routine wellness examination for [place].  Hearing/Vision screen Hearing Screening - Comments:: No hearing loss Vision Screening - Comments:: Gets eye exams   Goals Addressed             This Visit's Progress    Patient Stated       Exercise more and lose some weight      Depression Screen    06/02/2023   10:08 AM 05/09/2023    9:48 AM 11/05/2022    9:39 AM 05/06/2022    9:32 AM 11/04/2021    9:12 AM 03/30/2021   11:35 AM 05/06/2020   10:05 AM  PHQ 2/9 Scores  PHQ - 2 Score 0 0 1 0 2 0 1  PHQ- 9 Score 0 0 4 0 6  3    Fall Risk    06/02/2023   10:11 AM 05/06/2022    9:32 AM 11/04/2021    9:13 AM 03/30/2021   11:25 AM 02/16/2021    1:47 PM  Fall Risk   Falls in the past year? 0 0 0 0 0  Number falls in past yr: 0 0 0 0   Injury with Fall? 0 0 0 0   Risk for fall due to : No Fall Risks No Fall Risks No Fall Risks    Follow up Falls prevention discussed Falls prevention discussed Falls evaluation completed      MEDICARE RISK AT HOME: Medicare Risk at Home Any stairs in or around the home?: Yes (2 steps) If so, are there any without handrails?: No Home free of loose throw rugs in walkways, pet beds, electrical cords, etc?: Yes Adequate lighting in your home to reduce risk of falls?: Yes Life alert?: No Use of a cane, walker or w/c?: No Grab bars in the bathroom?: Yes Shower chair or bench in shower?: No Elevated toilet seat or a handicapped toilet?: Yes  TIMED UP AND GO:  Was the test performed?  No    Cognitive Function:        06/02/2023   10:13 AM 05/09/2023  9:49 AM 03/28/2020   11:21 AM 03/26/2019   11:12 AM 03/23/2018   10:20 AM  6CIT Screen  What Year? 0 points 0 points 0 points 0 points 0 points  What month? 0 points 0 points 0 points 0 points 0 points  What time? 0 points 0 points 0 points 0 points 0 points  Count back from 20 0 points 0 points 2 points 0 points 0 points  Months in reverse 0 points 0 points 0 points 0 points 0 points   Repeat phrase 0 points 4 points 0 points 0 points 4 points  Total Score 0 points 4 points 2 points 0 points 4 points    Immunizations Immunization History  Administered Date(s) Administered   Influenza, High Dose Seasonal PF 03/19/2016, 03/21/2017, 03/23/2018, 02/15/2019   Influenza,inj,Quad PF,6+ Mos 03/18/2015   Influenza-Unspecified 02/12/2020, 02/14/2021, 02/03/2022   PFIZER(Purple Top)SARS-COV-2 Vaccination 07/23/2019, 08/13/2019, 03/13/2020   Pneumococcal Conjugate-13 02/11/2014   Pneumococcal Polysaccharide-23 02/03/2010, 03/18/2015   Td 03/19/2004   Tdap 03/18/2015   Zoster Recombinant(Shingrix) 02/22/2023, 04/17/2023   Zoster, Live 02/20/2013    TDAP status: Up to date  Flu Vaccine status: Up to date  Pneumococcal vaccine status: Up to date  Covid-19 vaccine status: Information provided on how to obtain vaccines.   Qualifies for Shingles Vaccine? Yes   Zostavax completed Yes   Shingrix Completed?: Yes  Screening Tests Health Maintenance  Topic Date Due   COVID-19 Vaccine (4 - 2024-25 season) 01/16/2023   Medicare Annual Wellness (AWV)  06/01/2024   MAMMOGRAM  02/03/2025   DTaP/Tdap/Td (3 - Td or Tdap) 03/17/2025   DEXA SCAN  05/05/2026   Colonoscopy  07/14/2028   Pneumonia Vaccine 90+ Years old  Completed   INFLUENZA VACCINE  Completed   Hepatitis C Screening  Completed   Zoster Vaccines- Shingrix  Completed   HPV VACCINES  Aged Out    Health Maintenance  Health Maintenance Due  Topic Date Due   COVID-19 Vaccine (4 - 2024-25 season) 01/16/2023    Colorectal cancer screening: Type of screening: Colonoscopy. Completed 07/14/18. Repeat every 5 years  Mammogram status: Completed 02/04/23. Repeat every year  Bone Density status: Completed 05/05/16. Results reflect: Bone density results: NORMAL. Repeat every 10 years.  Lung Cancer Screening: (Low Dose CT Chest recommended if Age 27-80 years, 20 pack-year currently smoking OR have quit w/in 15years.) does  not qualify.   Lung Cancer Screening Referral: n/a  Additional Screening:  Hepatitis C Screening: does not qualify; Completed 03/18/15    Vision Screening: Recommended annual ophthalmology exams for early detection of glaucoma and other disorders of the eye.  Dental Screening: Recommended annual dental exams for proper oral hygiene   Community Resource Referral / Chronic Care Management: CRR required this visit?  No   CCM required this visit?  No     Plan:     I have personally reviewed and noted the following in the patient's chart:   Medical and social history Use of alcohol, tobacco or illicit drugs  Current medications and supplements including opioid prescriptions. Patient is not currently taking opioid prescriptions. Functional ability and status Nutritional status Physical activity Advanced directives List of other physicians Hospitalizations, surgeries, and ER visits in previous 12 months Vitals Screenings to include cognitive, depression, and falls Referrals and appointments  In addition, I have reviewed and discussed with patient certain preventive protocols, quality metrics, and best practice recommendations. A written personalized care plan for preventive services as well as general preventive health  recommendations were provided to patient.     Tora Kindred, CMA   06/02/2023   After Visit Summary: (Mail) Due to this being a telephonic visit, the after visit summary with patients personalized plan was offered to patient via mail   Nurse Notes:  Placed a referral for colonoscopy. Patient's last was 07/14/18 and 5 year recall. Patient requested [facility].

## 2023-07-03 NOTE — Patient Instructions (Incomplete)

## 2023-07-05 ENCOUNTER — Encounter: Payer: Self-pay | Admitting: Nurse Practitioner

## 2023-07-05 ENCOUNTER — Ambulatory Visit (INDEPENDENT_AMBULATORY_CARE_PROVIDER_SITE_OTHER): Payer: Medicare Other | Admitting: Nurse Practitioner

## 2023-07-05 VITALS — BP 133/78 | HR 99 | Temp 97.8°F | Ht 67.0 in | Wt 197.2 lb

## 2023-07-05 DIAGNOSIS — R413 Other amnesia: Secondary | ICD-10-CM

## 2023-07-05 MED ORDER — DONEPEZIL HCL 5 MG PO TABS: 5.0000 mg | ORAL_TABLET | Freq: Every day | ORAL | 2 refills | Status: DC

## 2023-07-05 NOTE — Progress Notes (Signed)
BP 133/78   Pulse 99   Temp 97.8 F (36.6 C) (Oral)   Ht 5\' 7"  (1.702 m)   Wt 197 lb 3.2 oz (89.4 kg)   LMP  (LMP Unknown)   SpO2 97%   BMI 30.89 kg/m    Subjective:    Patient ID: Angela Watts, female    DOB: 15-Dec-1950, 73 y.o.   MRN: [id number]  HPI: Angela Watts is a 73 y.o. female  Chief Complaint  Patient presents with   Memory Changes   Daughter at bedside to assist with HPI.  MEMORY CHANGES: Follow-up today for memory changes.  Overall labs have been reassuring, B12 level was on lower side of normal and she just started taking supplement 2 weeks ago.  Reports having memory issues for awhile, her daughter reports it has been present for awhile as well.  Her daughter reports she will look off and not be there at times.  Will forget names and repeat self often.  Her daughter reports she will not remember more recent things, historical memory better.  No change in routine at home.  Can get from A to B without issue.  History of dementia in family, paternal aunt and maternal uncle.  Parents had no memory issues, her father passed at 56, but her mother had good memory until she passed at 78.    06/02/2023   10:13 AM 05/09/2023    9:49 AM 03/28/2020   11:21 AM 03/26/2019   11:12 AM 03/23/2018   10:20 AM  6CIT Screen  What Year? 0 points 0 points 0 points 0 points 0 points  What month? 0 points 0 points 0 points 0 points 0 points  What time? 0 points 0 points 0 points 0 points 0 points  Count back from 20 0 points 0 points 2 points 0 points 0 points  Months in reverse 0 points 0 points 0 points 0 points 0 points  Repeat phrase 0 points 4 points 0 points 0 points 4 points  Total Score 0 points 4 points 2 points 0 points 4 points      07/05/2023    3:12 PM  MMSE - Mini Mental State Exam  Orientation to time 5  Orientation to Place 5  Registration 3  Attention/ Calculation 5  Recall 1  Language- name 2 objects 2  Language- repeat 1  Language- follow 3 step  command 3  Language- read & follow direction 1  Write a sentence 1  Copy design 1  Total score 28    Scores with trend up this visit, but denies any need for treatment.    07/05/2023    2:47 PM 06/02/2023   10:08 AM 05/09/2023    9:48 AM 11/05/2022    9:39 AM 05/06/2022    9:32 AM  Depression screen PHQ 2/9  Decreased Interest 1 0 0 0 0  Down, Depressed, Hopeless 1 0 0 1 0  PHQ - 2 Score 2 0 0 1 0  Altered sleeping 2 0 0 1 0  Tired, decreased energy 1 0 0 1 0  Change in appetite 0 0 0 0 0  Feeling bad or failure about yourself  1 0 0 0 0  Trouble concentrating 2 0 0 0 0  Moving slowly or fidgety/restless 1 0 0 1 0  Suicidal thoughts 0 0 0 0 0  PHQ-9 Score 9 0 0 4 0  Difficult doing work/chores Not difficult at all Not difficult at all  Not difficult at all  Not difficult at all       07/05/2023    2:45 PM 05/09/2023    9:49 AM 11/05/2022    9:39 AM 05/06/2022    9:32 AM  GAD 7 : Generalized Anxiety Score  Nervous, Anxious, on Edge 1 0 1 0  Control/stop worrying 1 0 1 0  Worry too much - different things 2 0 1 0  Trouble relaxing 1 0 0 0  Restless 0 0 0 0  Easily annoyed or irritable 0 0 1 0  Afraid - awful might happen 1 0 1 0  Total GAD 7 Score 6 0 5 0  Anxiety Difficulty Not difficult at all Not difficult at all Somewhat difficult Not difficult at all   Relevant past medical, surgical, family and social history reviewed and updated as indicated. Interim medical history since our last visit reviewed. Allergies and medications reviewed and updated.  Review of Systems  Constitutional:  Negative for activity change, appetite change, diaphoresis, fatigue and fever.  Respiratory:  Negative for cough, chest tightness and shortness of breath.   Cardiovascular:  Negative for chest pain, palpitations and leg swelling.  Gastrointestinal: Negative.   Neurological:  Negative for dizziness, syncope, facial asymmetry, weakness, light-headedness, numbness and headaches.   Psychiatric/Behavioral: Negative.      Per HPI unless specifically indicated above     Objective:    BP 133/78   Pulse 99   Temp 97.8 F (36.6 C) (Oral)   Ht 5\' 7"  (1.702 m)   Wt 197 lb 3.2 oz (89.4 kg)   LMP  (LMP Unknown)   SpO2 97%   BMI 30.89 kg/m   Wt Readings from Last 3 Encounters:  07/05/23 197 lb 3.2 oz (89.4 kg)  06/02/23 195 lb (88.5 kg)  05/09/23 199 lb 3.2 oz (90.4 kg)    Physical Exam Vitals and nursing note reviewed.  Constitutional:      General: She is awake. She is not in acute distress.    Appearance: She is well-developed and well-groomed. She is obese. She is not ill-appearing or toxic-appearing.  HENT:     Head: Normocephalic.     Right Ear: Hearing and external ear normal.     Left Ear: Hearing and external ear normal.  Eyes:     General: Lids are normal.        Right eye: No discharge.        Left eye: No discharge.     Conjunctiva/sclera: Conjunctivae normal.     Pupils: Pupils are equal, round, and reactive to light.  Neck:     Thyroid: No thyromegaly.     Vascular: No carotid bruit.  Cardiovascular:     Rate and Rhythm: Normal rate and regular rhythm.     Heart sounds: Normal heart sounds. No murmur heard.    No gallop.  Pulmonary:     Effort: Pulmonary effort is normal. No accessory muscle usage or respiratory distress.     Breath sounds: Normal breath sounds.  Abdominal:     General: Bowel sounds are normal. There is no distension.     Palpations: Abdomen is soft.     Tenderness: There is no abdominal tenderness.  Musculoskeletal:     Cervical back: Normal range of motion and neck supple.     Right lower leg: No edema.     Left lower leg: No edema.  Lymphadenopathy:     Cervical: No cervical adenopathy.  Skin:  General: Skin is warm and dry.  Neurological:     Mental Status: She is alert and oriented to person, place, and time.     Cranial Nerves: Cranial nerves 2-12 are intact.     Motor: Motor function is intact.      Coordination: Coordination is intact.     Gait: Gait is intact.     Deep Tendon Reflexes:     Reflex Scores:      Brachioradialis reflexes are 2+ on the right side and 2+ on the left side.      Patellar reflexes are 2+ on the right side and 2+ on the left side.    Comments: Oriented to person, place, time.  Psychiatric:        Attention and Perception: Attention normal.        Mood and Affect: Mood normal.        Speech: Speech normal.        Behavior: Behavior normal. Behavior is cooperative.        Thought Content: Thought content normal.        Cognition and Memory: Memory is impaired (short term recall only).    Results for orders placed or performed in visit on 05/09/23  Comprehensive metabolic panel   Collection Time: 05/09/23 10:00 AM  Result Value Ref Range   Glucose 93 70 - 99 mg/dL   BUN 13 8 - 27 mg/dL   Creatinine, Ser 9.60 0.57 - 1.00 mg/dL   eGFR 83 >45 WU/JWJ/1.91   BUN/Creatinine Ratio 17 12 - 28   Sodium 143 134 - 144 mmol/L   Potassium 4.0 3.5 - 5.2 mmol/L   Chloride 105 96 - 106 mmol/L   CO2 24 20 - 29 mmol/L   Calcium 9.2 8.7 - 10.3 mg/dL   Total Protein 6.8 6.0 - 8.5 g/dL   Albumin 4.0 3.8 - 4.8 g/dL   Globulin, Total 2.8 1.5 - 4.5 g/dL   Bilirubin Total 0.4 0.0 - 1.2 mg/dL   Alkaline Phosphatase 143 (H) 44 - 121 IU/L   AST 16 0 - 40 IU/L   ALT 14 0 - 32 IU/L  Lipid Panel w/o Chol/HDL Ratio   Collection Time: 05/09/23 10:00 AM  Result Value Ref Range   Cholesterol, Total 163 100 - 199 mg/dL   Triglycerides 52 0 - 149 mg/dL   HDL 65 >47 mg/dL   VLDL Cholesterol Cal 11 5 - 40 mg/dL   LDL Chol Calc (NIH) 87 0 - 99 mg/dL  Gamma GT   Collection Time: 05/09/23 10:00 AM  Result Value Ref Range   GGT 8 0 - 60 IU/L  TSH   Collection Time: 05/09/23 10:00 AM  Result Value Ref Range   TSH 1.780 0.450 - 4.500 uIU/mL  RPR   Collection Time: 05/09/23 10:00 AM  Result Value Ref Range   RPR Ser Ql Non Reactive Non Reactive  Vitamin B12   Collection Time:  05/09/23 10:00 AM  Result Value Ref Range   Vitamin B-12 429 232 - 1,245 pg/mL  CBC with Differential/Platelet   Collection Time: 05/09/23 10:00 AM  Result Value Ref Range   WBC 3.7 3.4 - 10.8 x10E3/uL   RBC 4.33 3.77 - 5.28 x10E6/uL   Hemoglobin 12.9 11.1 - 15.9 g/dL   Hematocrit 82.9 56.2 - 46.6 %   MCV 95 79 - 97 fL   MCH 29.8 26.6 - 33.0 pg   MCHC 31.5 31.5 - 35.7 g/dL   RDW 13.0 86.5 -  15.4 %   Platelets 284 150 - 450 x10E3/uL   Neutrophils 44 Not Estab. %   Lymphs 43 Not Estab. %   Monocytes 10 Not Estab. %   Eos 1 Not Estab. %   Basos 1 Not Estab. %   Neutrophils Absolute 1.7 1.4 - 7.0 x10E3/uL   Lymphocytes Absolute 1.6 0.7 - 3.1 x10E3/uL   Monocytes Absolute 0.4 0.1 - 0.9 x10E3/uL   EOS (ABSOLUTE) 0.1 0.0 - 0.4 x10E3/uL   Basophils Absolute 0.0 0.0 - 0.2 x10E3/uL   Immature Granulocytes 1 Not Estab. %   Immature Grans (Abs) 0.0 0.0 - 0.1 x10E3/uL      Assessment & Plan:   Problem List Items Addressed This Visit       Other   Memory changes - Primary   Ongoing for some time per patient's daughter.  6CIT was 4 last visit and MMSE 28/30 today. Difficulty with short term recall noted on both assessments.  Remainder of check was stable.  Recent labs were overall reassuring.  Discussed with daughter and patient, will start Aricept 5 MG daily.  Educated on medication and possible side effects.  Order for MRI brain to further assess.      Relevant Orders   MR Brain W Wo Contrast     Follow up plan: Return in about 6 weeks (around 08/16/2023) for MEMORY CHANGES.

## 2023-07-05 NOTE — Assessment & Plan Note (Signed)
Ongoing for some time per patient's daughter.  6CIT was 4 last visit and MMSE 28/30 today. Difficulty with short term recall noted on both assessments.  Remainder of check was stable.  Recent labs were overall reassuring.  Discussed with daughter and patient, will start Aricept 5 MG daily.  Educated on medication and possible side effects.  Order for MRI brain to further assess.

## 2023-07-12 ENCOUNTER — Ambulatory Visit
Admission: RE | Admit: 2023-07-12 | Discharge: 2023-07-12 | Disposition: A | Discharge status: Home / Self Care | Payer: Medicare Other | Source: Ambulatory Visit | Attending: Nurse Practitioner | Admitting: Nurse Practitioner

## 2023-07-12 DIAGNOSIS — I6782 Cerebral ischemia: Secondary | ICD-10-CM | POA: Diagnosis not present

## 2023-07-12 DIAGNOSIS — R413 Other amnesia: Secondary | ICD-10-CM | POA: Diagnosis not present

## 2023-07-12 MED ORDER — GADOBUTROL 1 MMOL/ML IV SOLN
9.0000 mL | Freq: Once | INTRAVENOUS | Status: AC | PRN
  Administered 2023-07-12: 9 mL via INTRAVENOUS

## 2023-07-13 ENCOUNTER — Telehealth: Payer: Self-pay | Admitting: Nurse Practitioner

## 2023-07-13 NOTE — Telephone Encounter (Signed)
 Spoke to [employer] via telephone and reviewed MRI brain results with her: - No atrophy noted, discussed this with her and what this means. 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  Educated her on this finding and that since chronic we do not know when this event took place and this is a finding we at times see on imaging, where a person may not have had symptoms during the event and may not have noticed any changes.  Discussed that goal is to prevent further events by maintaining stable blood pressure and lipid panel levels. - Educated on chronic small vessel disease, which was seen on imaging.   Recommend she maintain Aricept for now and we will continue to monitor memory.  Answered all questions and discussed with her if daughter has any questions to reach out to PCP.

## 2023-07-13 NOTE — Progress Notes (Signed)
 Refer to telephone note 07/13/23.

## 2023-08-15 NOTE — Patient Instructions (Addendum)
-   Talk to daughter about starting Zoloft for depression  Memory Compensation Strategies  Use "WARM" strategy.  W= write it down  A= associate it  R= repeat it  M= make a mental note  2.   You can keep a Glass blower/designer.  Use a 3-ring notebook with sections for the following: calendar, important names and phone numbers,  medications, doctors' names/phone numbers, lists/reminders, and a section to journal what you did  each day.   3.    Use a calendar to write appointments down.  4.    Write yourself a schedule for the day.  This can be placed on the calendar or in a separate section of the Memory Notebook.  Keeping a  regular schedule can help memory.  5.    Use medication organizer with sections for each day or morning/evening pills.  You may need help loading it  6.    Keep a basket, or pegboard by the door.  Place items that you need to take out with you in the basket or on the pegboard.  You may also want to  include a message board for reminders.  7.    Use sticky notes.  Place sticky notes with reminders in a place where the task is performed.  For example: " turn off the  stove" placed by the stove, "lock the door" placed on the door at eye level, " take your medications" on  the bathroom mirror or by the place where you normally take your medications.  8.    Use alarms/timers.  Use while cooking to remind yourself to check on food or as a reminder to take your medicine, or as a  reminder to make a call, or as a reminder to perform another task, etc.

## 2023-08-24 ENCOUNTER — Encounter: Payer: Self-pay | Admitting: Nurse Practitioner

## 2023-08-24 ENCOUNTER — Ambulatory Visit (INDEPENDENT_AMBULATORY_CARE_PROVIDER_SITE_OTHER): Payer: Medicare Other | Admitting: Nurse Practitioner

## 2023-08-24 VITALS — BP 137/64 | HR 78 | Temp 98.1°F | Ht 67.0 in | Wt 191.2 lb

## 2023-08-24 DIAGNOSIS — M059 Rheumatoid arthritis with rheumatoid factor, unspecified: Secondary | ICD-10-CM | POA: Diagnosis not present

## 2023-08-24 DIAGNOSIS — M170 Bilateral primary osteoarthritis of knee: Secondary | ICD-10-CM | POA: Diagnosis not present

## 2023-08-24 DIAGNOSIS — L409 Psoriasis, unspecified: Secondary | ICD-10-CM | POA: Diagnosis not present

## 2023-08-24 DIAGNOSIS — F320 Major depressive disorder, single episode, mild: Secondary | ICD-10-CM | POA: Insufficient documentation

## 2023-08-24 DIAGNOSIS — R413 Other amnesia: Secondary | ICD-10-CM | POA: Diagnosis not present

## 2023-08-24 DIAGNOSIS — Z79899 Other long term (current) drug therapy: Secondary | ICD-10-CM | POA: Diagnosis not present

## 2023-08-24 NOTE — Assessment & Plan Note (Signed)
 Ongoing due to current political environment per patient.  Denies SI/HI.  Discussed treatment options with her, SSRI family to start.  Would benefit low dose Zoloft 25 MG.  Discussed side effects and BLACK BOX warning with her.  Recommend she discuss with daughter and will start medication if requested by her in future.

## 2023-08-24 NOTE — Progress Notes (Signed)
 BP 137/64   Pulse 78   Temp 98.1 F (36.7 C) (Oral)   Ht 5\' 7"  (1.702 m)   Wt 191 lb 3.2 oz (86.7 kg)   LMP  (LMP Unknown)   SpO2 97%   BMI 29.95 kg/m    Subjective:    Patient ID: Angela Watts, female    DOB: 19-Jul-1950, 73 y.o.   MRN: [id number]  HPI: Angela Watts is a 73 y.o. female  Chief Complaint  Patient presents with   Memory Changes    6 week f/up   MEMORY CHANGES: Follow-up today for memory changes, family not at bedside today.  Aricept started last visit at 5 MG and she is tolerating well. Labs have been reassuring, B12 level was on lower she is taking supplement now.  Reports having memory issues for awhile.  Her daughter reported last visit that she will look off and not be there at times.  Will forget names and repeat self often.  Her daughter reports she will not remember more recent things, historical memory better.  No change in routine at home.  Can get from A to B without issue.    History of dementia in family, paternal aunt and maternal uncle.  Parents had no memory issues, her father passed at 87, but her mother had good memory until she passed at 72.    06/02/2023   10:13 AM 05/09/2023    9:49 AM 03/28/2020   11:21 AM 03/26/2019   11:12 AM 03/23/2018   10:20 AM  6CIT Screen  What Year? 0 points 0 points 0 points 0 points 0 points  What month? 0 points 0 points 0 points 0 points 0 points  What time? 0 points 0 points 0 points 0 points 0 points  Count back from 20 0 points 0 points 2 points 0 points 0 points  Months in reverse 0 points 0 points 0 points 0 points 0 points  Repeat phrase 0 points 4 points 0 points 0 points 4 points  Total Score 0 points 4 points 2 points 0 points 4 points      07/05/2023    3:12 PM  MMSE - Mini Mental State Exam  Orientation to time 5  Orientation to Place 5  Registration 3  Attention/ Calculation 5  Recall 1  Language- name 2 objects 2  Language- repeat 1  Language- follow 3 step command 3  Language-  read & follow direction 1  Write a sentence 1  Copy design 1  Total score 28    Scores with trend up this visit, but denies any need for treatment.  Denies any SI, marked it on form by accident and redid screening.  Does endorse feeling depressed sometimes.  Every day stuff can make her sad.  Current political environment is causing her to feel down.    08/24/2023   10:28 AM 08/24/2023   10:22 AM 07/05/2023    2:47 PM 06/02/2023   10:08 AM 05/09/2023    9:48 AM  Depression screen PHQ 2/9  Decreased Interest 1 1 1  0 0  Down, Depressed, Hopeless 1 1 1  0 0  PHQ - 2 Score 2 2 2  0 0  Altered sleeping 2 2 2  0 0  Tired, decreased energy 1 1 1  0 0  Change in appetite 1 1 0 0 0  Feeling bad or failure about yourself  1 1 1  0 0  Trouble concentrating 0 1 2 0 0  Moving  slowly or fidgety/restless 0 1 1 0 0  Suicidal thoughts 0 1 0 0 0  PHQ-9 Score 7 10 9  0 0  Difficult doing work/chores Not difficult at all Somewhat difficult Not difficult at all Not difficult at all Not difficult at all       08/24/2023   10:22 AM 07/05/2023    2:45 PM 05/09/2023    9:49 AM 11/05/2022    9:39 AM  GAD 7 : Generalized Anxiety Score  Nervous, Anxious, on Edge 1 1 0 1  Control/stop worrying 1 1 0 1  Worry too much - different things 1 2 0 1  Trouble relaxing 1 1 0 0  Restless 0 0 0 0  Easily annoyed or irritable 1 0 0 1  Afraid - awful might happen 1 1 0 1  Total GAD 7 Score 6 6 0 5  Anxiety Difficulty Somewhat difficult Not difficult at all Not difficult at all Somewhat difficult   Relevant past medical, surgical, family and social history reviewed and updated as indicated. Interim medical history since our last visit reviewed. Allergies and medications reviewed and updated.  Review of Systems  Constitutional:  Negative for activity change, appetite change, diaphoresis, fatigue and fever.  Respiratory:  Negative for cough, chest tightness and shortness of breath.   Cardiovascular:  Negative for chest pain,  palpitations and leg swelling.  Gastrointestinal: Negative.   Neurological:  Negative for dizziness, syncope, facial asymmetry, weakness, light-headedness, numbness and headaches.  Psychiatric/Behavioral: Negative.      Per HPI unless specifically indicated above     Objective:    BP 137/64   Pulse 78   Temp 98.1 F (36.7 C) (Oral)   Ht 5\' 7"  (1.702 m)   Wt 191 lb 3.2 oz (86.7 kg)   LMP  (LMP Unknown)   SpO2 97%   BMI 29.95 kg/m   Wt Readings from Last 3 Encounters:  08/24/23 191 lb 3.2 oz (86.7 kg)  07/05/23 197 lb 3.2 oz (89.4 kg)  06/02/23 195 lb (88.5 kg)    Physical Exam Vitals and nursing note reviewed.  Constitutional:      General: She is awake. She is not in acute distress.    Appearance: She is well-developed and well-groomed. She is obese. She is not ill-appearing or toxic-appearing.  HENT:     Head: Normocephalic.     Right Ear: Hearing and external ear normal.     Left Ear: Hearing and external ear normal.  Eyes:     General: Lids are normal.        Right eye: No discharge.        Left eye: No discharge.     Conjunctiva/sclera: Conjunctivae normal.     Pupils: Pupils are equal, round, and reactive to light.  Neck:     Thyroid: No thyromegaly.     Vascular: No carotid bruit.  Cardiovascular:     Rate and Rhythm: Normal rate and regular rhythm.     Heart sounds: Normal heart sounds. No murmur heard.    No gallop.  Pulmonary:     Effort: Pulmonary effort is normal. No accessory muscle usage or respiratory distress.     Breath sounds: Normal breath sounds.  Abdominal:     General: Bowel sounds are normal. There is no distension.     Palpations: Abdomen is soft.     Tenderness: There is no abdominal tenderness.  Musculoskeletal:     Cervical back: Normal range of motion and neck  supple.     Right lower leg: No edema.     Left lower leg: No edema.  Lymphadenopathy:     Cervical: No cervical adenopathy.  Skin:    General: Skin is warm and dry.   Neurological:     Mental Status: She is alert and oriented to person, place, and time.     Cranial Nerves: Cranial nerves 2-12 are intact.     Motor: Motor function is intact.     Coordination: Coordination is intact.     Gait: Gait is intact.     Deep Tendon Reflexes:     Reflex Scores:      Brachioradialis reflexes are 2+ on the right side and 2+ on the left side.      Patellar reflexes are 2+ on the right side and 2+ on the left side.    Comments: Oriented to person, place, time.  Psychiatric:        Attention and Perception: Attention normal.        Mood and Affect: Mood normal.        Speech: Speech normal.        Behavior: Behavior normal. Behavior is cooperative.        Thought Content: Thought content normal.        Cognition and Memory: Memory is impaired (short term recall only).    Results for orders placed or performed in visit on 05/09/23  Comprehensive metabolic panel   Collection Time: 05/09/23 10:00 AM  Result Value Ref Range   Glucose 93 70 - 99 mg/dL   BUN 13 8 - 27 mg/dL   Creatinine, Ser 3.08 0.57 - 1.00 mg/dL   eGFR 83 >65 HQ/ION/6.29   BUN/Creatinine Ratio 17 12 - 28   Sodium 143 134 - 144 mmol/L   Potassium 4.0 3.5 - 5.2 mmol/L   Chloride 105 96 - 106 mmol/L   CO2 24 20 - 29 mmol/L   Calcium 9.2 8.7 - 10.3 mg/dL   Total Protein 6.8 6.0 - 8.5 g/dL   Albumin 4.0 3.8 - 4.8 g/dL   Globulin, Total 2.8 1.5 - 4.5 g/dL   Bilirubin Total 0.4 0.0 - 1.2 mg/dL   Alkaline Phosphatase 143 (H) 44 - 121 IU/L   AST 16 0 - 40 IU/L   ALT 14 0 - 32 IU/L  Lipid Panel w/o Chol/HDL Ratio   Collection Time: 05/09/23 10:00 AM  Result Value Ref Range   Cholesterol, Total 163 100 - 199 mg/dL   Triglycerides 52 0 - 149 mg/dL   HDL 65 >52 mg/dL   VLDL Cholesterol Cal 11 5 - 40 mg/dL   LDL Chol Calc (NIH) 87 0 - 99 mg/dL  Gamma GT   Collection Time: 05/09/23 10:00 AM  Result Value Ref Range   GGT 8 0 - 60 IU/L  TSH   Collection Time: 05/09/23 10:00 AM  Result Value  Ref Range   TSH 1.780 0.450 - 4.500 uIU/mL  RPR   Collection Time: 05/09/23 10:00 AM  Result Value Ref Range   RPR Ser Ql Non Reactive Non Reactive  Vitamin B12   Collection Time: 05/09/23 10:00 AM  Result Value Ref Range   Vitamin B-12 429 232 - 1,245 pg/mL  CBC with Differential/Platelet   Collection Time: 05/09/23 10:00 AM  Result Value Ref Range   WBC 3.7 3.4 - 10.8 x10E3/uL   RBC 4.33 3.77 - 5.28 x10E6/uL   Hemoglobin 12.9 11.1 - 15.9 g/dL   Hematocrit  41.0 34.0 - 46.6 %   MCV 95 79 - 97 fL   MCH 29.8 26.6 - 33.0 pg   MCHC 31.5 31.5 - 35.7 g/dL   RDW 16.1 09.6 - 04.5 %   Platelets 284 150 - 450 x10E3/uL   Neutrophils 44 Not Estab. %   Lymphs 43 Not Estab. %   Monocytes 10 Not Estab. %   Eos 1 Not Estab. %   Basos 1 Not Estab. %   Neutrophils Absolute 1.7 1.4 - 7.0 x10E3/uL   Lymphocytes Absolute 1.6 0.7 - 3.1 x10E3/uL   Monocytes Absolute 0.4 0.1 - 0.9 x10E3/uL   EOS (ABSOLUTE) 0.1 0.0 - 0.4 x10E3/uL   Basophils Absolute 0.0 0.0 - 0.2 x10E3/uL   Immature Granulocytes 1 Not Estab. %   Immature Grans (Abs) 0.0 0.0 - 0.1 x10E3/uL      Assessment & Plan:   Problem List Items Addressed This Visit       Other   Depression, major, single episode, mild (HCC) - Primary   Ongoing due to current political environment per patient.  Denies SI/HI.  Discussed treatment options with her, SSRI family to start.  Would benefit low dose Zoloft 25 MG.  Discussed side effects and BLACK BOX warning with her.  Recommend she discuss with daughter and will start medication if requested by her in future.      Memory changes   Ongoing for some time per patient's daughter.  6CIT was 4 in past and MMSE 28/30 in past. Difficulty with short term recall noted on both assessments.  Remainder of check was stable.  Recent labs were overall reassuring. Continue Aricept 5 MG daily.  Educated on medication and possible side effects.  Having no ADR with this.  Recent imaging did not note any atrophy, but  did note chronic infarct present.  Monitor BP and maintain good control.        Follow up plan: Return in about 4 months (around 12/13/2023) for HTN/HLD, RA, MEMORY.

## 2023-08-24 NOTE — Assessment & Plan Note (Signed)
 Ongoing for some time per patient's daughter.  6CIT was 4 in past and MMSE 28/30 in past. Difficulty with short term recall noted on both assessments.  Remainder of check was stable.  Recent labs were overall reassuring. Continue Aricept 5 MG daily.  Educated on medication and possible side effects.  Having no ADR with this.  Recent imaging did not note any atrophy, but did note chronic infarct present.  Monitor BP and maintain good control.

## 2023-09-05 DIAGNOSIS — M170 Bilateral primary osteoarthritis of knee: Secondary | ICD-10-CM | POA: Diagnosis not present

## 2023-09-05 DIAGNOSIS — M069 Rheumatoid arthritis, unspecified: Secondary | ICD-10-CM | POA: Diagnosis not present

## 2023-09-17 ENCOUNTER — Other Ambulatory Visit: Payer: Self-pay | Admitting: Nurse Practitioner

## 2023-09-18 ENCOUNTER — Emergency Department

## 2023-09-18 ENCOUNTER — Other Ambulatory Visit: Payer: Self-pay

## 2023-09-18 ENCOUNTER — Encounter: Payer: Self-pay | Admitting: Emergency Medicine

## 2023-09-18 ENCOUNTER — Emergency Department
Admission: EM | Admit: 2023-09-18 | Discharge: 2023-09-18 | Disposition: A | Discharge status: Home / Self Care | Attending: Emergency Medicine | Admitting: Emergency Medicine

## 2023-09-18 DIAGNOSIS — R109 Unspecified abdominal pain: Secondary | ICD-10-CM | POA: Diagnosis not present

## 2023-09-18 DIAGNOSIS — N281 Cyst of kidney, acquired: Secondary | ICD-10-CM | POA: Diagnosis not present

## 2023-09-18 DIAGNOSIS — R1010 Upper abdominal pain, unspecified: Secondary | ICD-10-CM | POA: Diagnosis not present

## 2023-09-18 DIAGNOSIS — R1011 Right upper quadrant pain: Secondary | ICD-10-CM | POA: Diagnosis not present

## 2023-09-18 DIAGNOSIS — K5730 Diverticulosis of large intestine without perforation or abscess without bleeding: Secondary | ICD-10-CM | POA: Diagnosis not present

## 2023-09-18 DIAGNOSIS — I10 Essential (primary) hypertension: Secondary | ICD-10-CM | POA: Diagnosis not present

## 2023-09-18 DIAGNOSIS — R1030 Lower abdominal pain, unspecified: Secondary | ICD-10-CM | POA: Insufficient documentation

## 2023-09-18 LAB — URINALYSIS, ROUTINE W REFLEX MICROSCOPIC
Bilirubin Urine: NEGATIVE
Glucose, UA: NEGATIVE mg/dL
Hgb urine dipstick: NEGATIVE
Ketones, ur: 5 mg/dL — AB
Leukocytes,Ua: NEGATIVE
Nitrite: NEGATIVE
Protein, ur: NEGATIVE mg/dL
Specific Gravity, Urine: 1.032 — ABNORMAL HIGH (ref 1.005–1.030)
pH: 6 (ref 5.0–8.0)

## 2023-09-18 LAB — BASIC METABOLIC PANEL WITH GFR
Anion gap: 7 (ref 5–15)
BUN: 16 mg/dL (ref 8–23)
CO2: 26 mmol/L (ref 22–32)
Calcium: 9 mg/dL (ref 8.9–10.3)
Chloride: 106 mmol/L (ref 98–111)
Creatinine, Ser: 0.78 mg/dL (ref 0.44–1.00)
GFR, Estimated: >60 mL/min (ref >60)
Glucose, Bld: 103 mg/dL — ABNORMAL HIGH (ref 70–99)
Potassium: 3.5 mmol/L (ref 3.5–5.1)
Sodium: 139 mmol/L (ref 135–145)

## 2023-09-18 LAB — HEPATIC FUNCTION PANEL
ALT: 20 U/L (ref 0–44)
AST: 22 U/L (ref 15–41)
Albumin: 3.6 g/dL (ref 3.5–5.0)
Alkaline Phosphatase: 106 U/L (ref 38–126)
Bilirubin, Direct: <0.1 mg/dL (ref 0.0–0.2)
Total Bilirubin: 0.5 mg/dL (ref 0.0–1.2)
Total Protein: 7.3 g/dL (ref 6.5–8.1)

## 2023-09-18 LAB — CBC
HCT: 39.2 % (ref 36.0–46.0)
Hemoglobin: 12.9 g/dL (ref 12.0–15.0)
MCH: 29.7 pg (ref 26.0–34.0)
MCHC: 32.9 g/dL (ref 30.0–36.0)
MCV: 90.1 fL (ref 80.0–100.0)
Platelets: 299 10*3/uL (ref 150–400)
RBC: 4.35 MIL/uL (ref 3.87–5.11)
RDW: 14.6 % (ref 11.5–15.5)
WBC: 4.1 10*3/uL (ref 4.0–10.5)
nRBC: 0 % (ref 0.0–0.2)

## 2023-09-18 LAB — TROPONIN I (HIGH SENSITIVITY): Troponin I (High Sensitivity): 5 ng/L (ref <18)

## 2023-09-18 LAB — LIPASE, BLOOD: Lipase: 25 U/L (ref 11–51)

## 2023-09-18 MED ORDER — ACETAMINOPHEN 500 MG PO TABS
1000.0000 mg | ORAL_TABLET | Freq: Once | ORAL | Status: AC
Start: 2023-09-18 — End: 2023-09-18
  Administered 2023-09-18: 1000 mg via ORAL
  Filled 2023-09-18: qty 2

## 2023-09-18 MED ORDER — IOHEXOL 300 MG/ML  SOLN
100.0000 mL | Freq: Once | INTRAMUSCULAR | Status: AC | PRN
  Administered 2023-09-18: 80 mL via INTRAVENOUS

## 2023-09-18 MED ORDER — LIDOCAINE 5 % EX PTCH: 1.0000 | MEDICATED_PATCH | CUTANEOUS | 0 refills | Status: AC

## 2023-09-18 MED ORDER — LIDOCAINE 5 % EX PTCH
1.0000 | MEDICATED_PATCH | CUTANEOUS | Status: DC
  Administered 2023-09-18: 1 via TRANSDERMAL
  Filled 2023-09-18: qty 1

## 2023-09-18 NOTE — ED Notes (Signed)
 See triage note  Presents with right flank pain which moves into right side of abd  States pain started on Friday  Has been intermittent

## 2023-09-18 NOTE — ED Provider Notes (Signed)
 [facility] Provider Note    Event Date/Time   First MD Initiated Contact with Patient 09/18/23 1223     (approximate)   History   Flank Pain   HPI  Paitynn SHARNELLE AMADEO is a 73 y.o. female  who comes in with R sided flank pain.  Patient reports 3 days of right sided mid flank pain.  She reports the pain sometimes radiates into her abdomen.  Does report having 2 episodes of diarrhea daily rather than just 1 episode of bowel movement which is her typical.  She denies it leaking out of her.  She states she knows she has to go to the bathroom and then will sit down and have some softer stool than normal.  She denies any urinary incontinence, no numbness, no weakness in her legs.  She denies any chest pain, shortness of breath, urinary symptoms.  She does report the pain is worse with certain movements.  She is unsure if she could have pulled a muscle.   Physical Exam   Triage Vital Signs: ED Triage Vitals  Encounter Vitals Group     BP 09/18/23 1148 (!) 128/103     Systolic BP Percentile --      Diastolic BP Percentile --      Pulse Rate 09/18/23 1148 71     Resp 09/18/23 1148 17     Temp 09/18/23 1148 98.1 F (36.7 C)     Temp Source 09/18/23 1148 Oral     SpO2 09/18/23 1148 94 %     Weight 09/18/23 1149 190 lb (86.2 kg)     Height 09/18/23 1149 5\' 5"  (1.651 m)     Head Circumference --      Peak Flow --      Pain Score 09/18/23 1149 5     Pain Loc --      Pain Education --      Exclude from Growth Chart --     Most recent vital signs: Vitals:   09/18/23 1148  BP: (!) 128/103  Pulse: 71  Resp: 17  Temp: 98.1 F (36.7 C)  SpO2: 94%     General: Awake, no distress.  CV:  Good peripheral perfusion.  Resp:  Normal effort.  Clear lungs no wheezing Abd:  No distention.  Other:  No rash noted.  Some mid right flank tenderness with some slight upper and lower abdominal tenderness. No swelling legs.  No calf tenderness No CTL spine  tenderness No numbness legs good distal pulses.   ED Results / Procedures / Treatments   Labs (all labs ordered are listed, but only abnormal results are displayed) Labs Reviewed  BASIC METABOLIC PANEL WITH GFR - Abnormal; Notable for the following components:      Result Value   Glucose, Bld 103 (*)    All other components within normal limits  CBC  URINALYSIS, ROUTINE W REFLEX MICROSCOPIC  HEPATIC FUNCTION PANEL  LIPASE, BLOOD     EKG  My interpretation of EKG:  Normal sinus rate of 61 without any ST elevation or T wave inversions, normal intervals  RADIOLOGY I have reviewed the ultrasound personally interpreted no evidence of any gallstones   PROCEDURES:  Critical Care performed: No  Procedures   MEDICATIONS ORDERED IN ED: Medications  lidocaine  (LIDODERM ) 5 % 1 patch (1 patch Transdermal Patch Applied 09/18/23 1311)  acetaminophen (TYLENOL) tablet 1,000 mg (1,000 mg Oral Given 09/18/23 1310)  iohexol (OMNIPAQUE) 300 MG/ML solution 100 mL (80  mLs Intravenous Contrast Given 09/18/23 1316)     IMPRESSION / MDM / ASSESSMENT AND PLAN / ED COURSE  I reviewed the triage vital signs and the nursing notes.   Patient's presentation is most consistent with acute presentation with potential threat to life or bodily function.   Patient comes in with some mid right flank pain.  On examination she does have a little bit of right upper quadrant pain so we will get ultrasound to make sure no gallstones.  I will also get CT imaging to make sure no evidence of kidney stone, appendicitis, aneurysm or other acute pathology.  Her oxygen level was charted at 94% but I had them recheck it and is 96%.  She is adamant that she has no shortness of breath no pain with breathing no chest pain so I doubt that this represents a pulmonary embolism.  We discussed CT imaging of the chest but given none of the above, family is okay with proceeding with the CT scan of the abdomen only.  She also has no  swelling her legs, no calf tenderness to suggest DVT.  If workup is negative I suspect this is more likely musculoskeletal given she does report it is worse with certain movements.  No evidence of cord compression.  Lipase normal BMP reassuring CBC normal hepatic function normal troponin negative  Troponin is negative and symptoms been ongoing for more than 3 hours.  Actually started 3 days ago.   IMPRESSION: 1. Normal sonographic appearance of the gallbladder and common bile duct. 2. Mildly echogenic liver parenchyma is nonspecific but may represent hepatic steatosis.  IMPRESSION: 1. No acute or focal lesion to explain the patient's symptoms. 2. Descending and sigmoid diverticulosis without diverticulitis. 3. Simple cyst at the upper pole of the right kidney. No follow-up imaging recommended. 4. 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    Discussed reassuring workup and I suspect this is more likely musculoskeletal given is worse with certain movements.  Patient is able to ambulate to the bathroom reports feeling much better just after Tylenol.  Lidocaine  patch was applied given it was mistakenly not applied originally.  If patient's urine is negative patient can be discharged home with treatments with lidocaine , Tylenol and follow-up outpatient with her PCP and returning if develops worsening symptoms or any other concerns but this time patient reports feeling much improved.  UA with slight ketones in it which I did did discuss with patient and she is going to increase p.o. hydration.  At this time they feel comfortable with discharge home she has been ambulatory.  The patient is on the cardiac monitor to evaluate for evidence of arrhythmia and/or significant heart rate changes.  3:59 PM On repeat evaluation patient reports resolution of all symptoms and is feeling much better and feels comfortable with discharge home     FINAL CLINICAL IMPRESSION(S) / ED  DIAGNOSES   Final diagnoses:  Right flank pain  Muscle pain     Rx / DC Orders   ED Discharge Orders          Ordered    lidocaine  (LIDODERM ) 5 %  Every 24 hours        09/18/23 1547             Note:  This document was prepared using Dragon voice recognition software and may include unintentional dictation errors.   Lubertha Rush, MD 09/18/23 [phone]

## 2023-09-18 NOTE — Discharge Instructions (Addendum)
 She can take Tylenol 650 g every 8 hours with the lidocaine  patches, occasional ibuprofen 400 every 8 -12 hours and return to the ER if she develops worsening symptoms such as shortness of breath, chest pain or any other concerns otherwise return to the ER.   IMPRESSION: 1. Normal sonographic appearance of the gallbladder and common bile duct. 2. Mildly echogenic liver parenchyma is nonspecific but may represent hepatic steatosis.  IMPRESSION: 1. No acute or focal lesion to explain the patient's symptoms. 2. Descending and sigmoid diverticulosis without diverticulitis. 3. Simple cyst at the upper pole of the right kidney. No follow-up imaging recommended. 4. 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

## 2023-09-18 NOTE — ED Triage Notes (Signed)
 Patient to ED via POV for right sided flank pain. Intermittent since Friday with some diarrhea. Denies difficulty urinating or N/V.

## 2023-09-20 NOTE — Telephone Encounter (Signed)
 Requested Prescriptions  Refused Prescriptions Disp Refills   donepezil  (ARICEPT ) 5 MG tablet [Pharmacy Med Name: Donepezil  HCl Oral Tablet 5 MG] 90 tablet 3    Sig: TAKE 1 TABLET AT BEDTIME     Neurology:  Alzheimer's Agents Passed - 09/20/2023  8:53 AM      Passed - Valid encounter within last 6 months    Recent Outpatient Visits           3 weeks ago Depression, major, single episode, mild (HCC)   [facility] [place], Lavelle Posey, NP   2 months ago Memory changes   [place] [facility] [place], Lavelle Posey, NP

## 2023-09-21 NOTE — Patient Instructions (Signed)
 Acute Back Pain, Adult Acute back pain is sudden and usually short-lived. It is often caused by an injury to the muscles and tissues in the back. The injury may result from: A muscle, tendon, or ligament getting overstretched or torn. Ligaments are tissues that connect bones to each other. Lifting something improperly can cause a back strain. Wear and tear (degeneration) of the spinal disks. Spinal disks are circular tissue that provide cushioning between the bones of the spine (vertebrae). Twisting motions, such as while playing sports or doing yard work. A hit to the back. Arthritis. You may have a physical exam, lab tests, and imaging tests to find the cause of your pain. Acute back pain usually goes away with rest and home care. Follow these instructions at home: Managing pain, stiffness, and swelling Take over-the-counter and prescription medicines only as told by your health care provider. Treatment may include medicines for pain and inflammation that are taken by mouth or applied to the skin, or muscle relaxants. Your health care provider may recommend applying ice during the first 24-48 hours after your pain starts. To do this: Put ice in a plastic bag. Place a towel between your skin and the bag. Leave the ice on for 20 minutes, 2-3 times a day. Remove the ice if your skin turns bright red. This is very important. If you cannot feel pain, heat, or cold, you have a greater risk of damage to the area. If directed, apply heat to the affected area as often as told by your health care provider. Use the heat source that your health care provider recommends, such as a moist heat pack or a heating pad. Place a towel between your skin and the heat source. Leave the heat on for 20-30 minutes. Remove the heat if your skin turns bright red. This is especially important if you are unable to feel pain, heat, or cold. You have a greater risk of getting burned. Activity  Do not stay in bed. Staying in  bed for more than 1-2 days can delay your recovery. Sit up and stand up straight. Avoid leaning forward when you sit or hunching over when you stand. If you work at a desk, sit close to it so you do not need to lean over. Keep your chin tucked in. Keep your neck drawn back, and keep your elbows bent at a 90-degree angle (right angle). Sit high and close to the steering wheel when you drive. Add lower back (lumbar) support to your car seat, if needed. Take short walks on even surfaces as soon as you are able. Try to increase the length of time you walk each day. Do not sit, drive, or stand in one place for more than 30 minutes at a time. Sitting or standing for long periods of time can put stress on your back. Do not drive or use heavy machinery while taking prescription pain medicine. Use proper lifting techniques. When you bend and lift, use positions that put less stress on your back: [place] your knees. Keep the load close to your body. Avoid twisting. Exercise regularly as told by your health care provider. Exercising helps your back heal faster and helps prevent back injuries by keeping muscles strong and flexible. Work with a physical therapist to make a safe exercise program, as recommended by your health care provider. Do any exercises as told by your physical therapist. Lifestyle Maintain a healthy weight. Extra weight puts stress on your back and makes it difficult to have good  posture. Avoid activities or situations that make you feel anxious or stressed. Stress and anxiety increase muscle tension and can make back pain worse. Learn ways to manage anxiety and stress, such as through exercise. General instructions Sleep on a firm mattress in a comfortable position. Try lying on your side with your knees slightly bent. If you lie on your back, put a pillow under your knees. Keep your head and neck in a straight line with your spine (neutral position) when using electronic equipment like  smartphones or pads. To do this: Raise your smartphone or pad to look at it instead of bending your head or neck to look down. Put the smartphone or pad at the level of your face while looking at the screen. Follow your treatment plan as told by your health care provider. This may include: Cognitive or behavioral therapy. Acupuncture or massage therapy. Meditation or yoga. Contact a health care provider if: You have pain that is not relieved with rest or medicine. You have increasing pain going down into your legs or buttocks. Your pain does not improve after 2 weeks. You have pain at night. You lose weight without trying. You have a fever or chills. You develop nausea or vomiting. You develop abdominal pain. Get help right away if: You develop new bowel or bladder control problems. You have unusual weakness or numbness in your arms or legs. You feel faint. These symptoms may represent a serious problem that is an emergency. Do not wait to see if the symptoms will go away. Get medical help right away. Call your local emergency services (911 in the [place] Do not drive yourself to the hospital. Summary Acute back pain is sudden and usually short-lived. Use proper lifting techniques. When you bend and lift, use positions that put less stress on your back. Take over-the-counter and prescription medicines only as told by your health care provider, and apply heat or ice as told. This information is not intended to replace advice given to you by your health care provider. Make sure you discuss any questions you have with your health care provider. Document Revised: 07/25/2020 Document Reviewed: 07/25/2020 Elsevier Patient Education  2024 [employer].

## 2023-09-22 ENCOUNTER — Encounter: Payer: Self-pay | Admitting: Nurse Practitioner

## 2023-09-22 ENCOUNTER — Ambulatory Visit (INDEPENDENT_AMBULATORY_CARE_PROVIDER_SITE_OTHER): Admitting: Nurse Practitioner

## 2023-09-22 VITALS — BP 129/56 | HR 67 | Temp 97.5°F | Ht 67.0 in | Wt 189.0 lb

## 2023-09-22 DIAGNOSIS — R109 Unspecified abdominal pain: Secondary | ICD-10-CM | POA: Diagnosis not present

## 2023-09-22 NOTE — Assessment & Plan Note (Signed)
 Suspect more muscular in nature due to heavy cleaning of house prior to pain.  Currently is improving.  Recent ER work-up reassuring.  Continue Lidocaine  patches as needed.

## 2023-09-22 NOTE — Progress Notes (Signed)
 BP (!) 129/56   Pulse 67   Temp (!) 97.5 F (36.4 C) (Oral)   Ht 5\' 7"  (1.702 m)   Wt 189 lb (85.7 kg)   LMP  (LMP Unknown)   SpO2 98%   BMI 29.60 kg/m    Subjective:    Patient ID: Angela Watts, female    DOB: [birth year], 72 y.o.   MRN: [id number]  HPI: Angela Watts is a 73 y.o. female  Chief Complaint  Patient presents with   ER Follow Up   ER FOLLOW UP Seen in ER at [facility] on 09/18/23 for right flank pain. Overall work-up was reassuring and was sent home with Lidocaine  patches.  She reports benefit from this.  Prior to pain she was lifting and cleaning the house, mopping. Has a little bit of pain still, but much improved.  Pain today 3/10.   Time since discharge: 4 days Hospital/facility: [facility] Diagnosis: Right Flank Pain Procedures/tests: Blood work, CT Scan, and U/S Consultants: none New medications: Lidocaine  Discharge instructions:  Follow-up with PCP Status: stable   Relevant past medical, surgical, family and social history reviewed and updated as indicated. Interim medical history since our last visit reviewed. Allergies and medications reviewed and updated.  Review of Systems  Constitutional:  Negative for activity change, appetite change, diaphoresis, fatigue and fever.  Respiratory:  Negative for cough, chest tightness and shortness of breath.   Cardiovascular:  Negative for chest pain, palpitations and leg swelling.  Gastrointestinal: Negative.   Musculoskeletal:  Positive for back pain (improved).  Neurological: Negative.   Psychiatric/Behavioral: Negative.     Per HPI unless specifically indicated above     Objective:     BP (!) 129/56   Pulse 67   Temp (!) 97.5 F (36.4 C) (Oral)   Ht 5\' 7"  (1.702 m)   Wt 189 lb (85.7 kg)   LMP  (LMP Unknown)   SpO2 98%   BMI 29.60 kg/m   Wt Readings from Last 3 Encounters:  09/22/23 189 lb (85.7 kg)  09/18/23 190 lb (86.2 kg)  08/24/23 191 lb 3.2 oz (86.7 kg)    Physical Exam Vitals and  nursing note reviewed.  Constitutional:      General: She is awake. She is not in acute distress.    Appearance: She is well-developed and well-groomed. She is obese. She is not ill-appearing or toxic-appearing.  HENT:     Head: Normocephalic.     Right Ear: Hearing and external ear normal.     Left Ear: Hearing and external ear normal.  Eyes:     General: Lids are normal.        Right eye: No discharge.        Left eye: No discharge.     Conjunctiva/sclera: Conjunctivae normal.     Pupils: Pupils are equal, round, and reactive to light.  Neck:     Thyroid : No thyromegaly.     Vascular: No carotid bruit.  Cardiovascular:     Rate and Rhythm: Normal rate and regular rhythm.     Heart sounds: Normal heart sounds. No murmur heard.    No gallop.  Pulmonary:     Effort: Pulmonary effort is normal. No accessory muscle usage or respiratory distress.     Breath sounds: Normal breath sounds.  Abdominal:     General: Bowel sounds are normal. There is no distension.     Palpations: Abdomen is soft.     Tenderness: There is no abdominal  tenderness.  Musculoskeletal:     Cervical back: Normal, normal range of motion and neck supple.     Thoracic back: Tenderness (mild to lateral upper right side) present. No edema, lacerations, spasms or bony tenderness. Normal range of motion.     Lumbar back: Normal.     Right lower leg: No edema.     Left lower leg: No edema.  Lymphadenopathy:     Cervical: No cervical adenopathy.  Skin:    General: Skin is warm and dry.  Neurological:     Mental Status: She is alert and oriented to person, place, and time.     Deep Tendon Reflexes: Reflexes are normal and symmetric.     Reflex Scores:      Brachioradialis reflexes are 2+ on the right side and 2+ on the left side.      Patellar reflexes are 2+ on the right side and 2+ on the left side. Psychiatric:        Attention and Perception: Attention normal.        Mood and Affect: Mood normal.         Speech: Speech normal.        Behavior: Behavior normal. Behavior is cooperative.        Thought Content: Thought content normal.     Results for orders placed or performed during the hospital encounter of 09/18/23  Basic metabolic panel   Collection Time: 09/18/23 11:50 AM  Result Value Ref Range   Sodium 139 135 - 145 mmol/L   Potassium 3.5 3.5 - 5.1 mmol/L   Chloride 106 98 - 111 mmol/L   CO2 26 22 - 32 mmol/L   Glucose, Bld 103 (H) 70 - 99 mg/dL   BUN 16 8 - 23 mg/dL   Creatinine, Ser 1.61 0.44 - 1.00 mg/dL   Calcium  9.0 8.9 - 10.3 mg/dL   GFR, Estimated >09 >60 mL/min   Anion gap 7 5 - 15  CBC   Collection Time: 09/18/23 11:50 AM  Result Value Ref Range   WBC 4.1 4.0 - 10.5 K/uL   RBC 4.35 3.87 - 5.11 MIL/uL   Hemoglobin 12.9 12.0 - 15.0 g/dL   HCT 45.4 09.8 - 11.9 %   MCV 90.1 80.0 - 100.0 fL   MCH 29.7 26.0 - 34.0 pg   MCHC 32.9 30.0 - 36.0 g/dL   RDW 14.7 82.9 - 56.2 %   Platelets 299 150 - 400 K/uL   nRBC 0.0 0.0 - 0.2 %  Hepatic function panel   Collection Time: 09/18/23 11:50 AM  Result Value Ref Range   Total Protein 7.3 6.5 - 8.1 g/dL   Albumin 3.6 3.5 - 5.0 g/dL   AST 22 15 - 41 U/L   ALT 20 0 - 44 U/L   Alkaline Phosphatase 106 38 - 126 U/L   Total Bilirubin 0.5 0.0 - 1.2 mg/dL   Bilirubin, Direct <1.3 0.0 - 0.2 mg/dL   Indirect Bilirubin NOT CALCULATED 0.3 - 0.9 mg/dL  Lipase, blood   Collection Time: 09/18/23 11:50 AM  Result Value Ref Range   Lipase 25 11 - 51 U/L  Troponin I (High Sensitivity)   Collection Time: 09/18/23 11:50 AM  Result Value Ref Range   Troponin I (High Sensitivity) 5 <18 ng/L  Urinalysis, Routine w reflex microscopic -Urine, Clean Catch   Collection Time: 09/18/23  3:45 PM  Result Value Ref Range   Color, Urine STRAW (A) YELLOW   APPearance CLEAR (A)  CLEAR   Specific Gravity, Urine 1.032 (H) 1.005 - 1.030   pH 6.0 5.0 - 8.0   Glucose, UA NEGATIVE NEGATIVE mg/dL   Hgb urine dipstick NEGATIVE NEGATIVE   Bilirubin Urine  NEGATIVE NEGATIVE   Ketones, ur 5 (A) NEGATIVE mg/dL   Protein, ur NEGATIVE NEGATIVE mg/dL   Nitrite NEGATIVE NEGATIVE   Leukocytes,Ua NEGATIVE NEGATIVE      Assessment & Plan:   Problem List Items Addressed This Visit       Other   Right flank pain - Primary   Suspect more muscular in nature due to heavy cleaning of house prior to pain.  Currently is improving.  Recent ER work-up reassuring.  Continue Lidocaine  patches as needed.        Follow up plan: Return for as scheduled 12/26/2023.

## 2023-10-16 DIAGNOSIS — G459 Transient cerebral ischemic attack, unspecified: Secondary | ICD-10-CM

## 2023-10-16 HISTORY — DX: Transient cerebral ischemic attack, unspecified: G45.9

## 2023-11-07 DIAGNOSIS — M170 Bilateral primary osteoarthritis of knee: Secondary | ICD-10-CM | POA: Diagnosis not present

## 2023-11-08 ENCOUNTER — Emergency Department

## 2023-11-08 ENCOUNTER — Ambulatory Visit: Payer: Self-pay

## 2023-11-08 ENCOUNTER — Other Ambulatory Visit: Payer: Self-pay

## 2023-11-08 ENCOUNTER — Observation Stay
Admission: EM | Admit: 2023-11-08 | Discharge: 2023-11-09 | Disposition: A | Discharge status: Home / Self Care | Attending: Internal Medicine | Admitting: Internal Medicine

## 2023-11-08 ENCOUNTER — Observation Stay

## 2023-11-08 ENCOUNTER — Ambulatory Visit (INDEPENDENT_AMBULATORY_CARE_PROVIDER_SITE_OTHER): Admitting: Nurse Practitioner

## 2023-11-08 ENCOUNTER — Encounter: Payer: Self-pay | Admitting: Nurse Practitioner

## 2023-11-08 VITALS — BP 138/78 | HR 81 | Ht 67.0 in | Wt 185.0 lb

## 2023-11-08 DIAGNOSIS — R413 Other amnesia: Secondary | ICD-10-CM | POA: Diagnosis not present

## 2023-11-08 DIAGNOSIS — R9082 White matter disease, unspecified: Secondary | ICD-10-CM | POA: Diagnosis not present

## 2023-11-08 DIAGNOSIS — I10 Essential (primary) hypertension: Secondary | ICD-10-CM | POA: Diagnosis not present

## 2023-11-08 DIAGNOSIS — F10920 Alcohol use, unspecified with intoxication, uncomplicated: Secondary | ICD-10-CM | POA: Insufficient documentation

## 2023-11-08 DIAGNOSIS — M069 Rheumatoid arthritis, unspecified: Secondary | ICD-10-CM | POA: Diagnosis not present

## 2023-11-08 DIAGNOSIS — Z7982 Long term (current) use of aspirin: Secondary | ICD-10-CM | POA: Insufficient documentation

## 2023-11-08 DIAGNOSIS — Z79899 Other long term (current) drug therapy: Secondary | ICD-10-CM | POA: Insufficient documentation

## 2023-11-08 DIAGNOSIS — Z6841 Body Mass Index (BMI) 40.0 and over, adult: Secondary | ICD-10-CM | POA: Diagnosis not present

## 2023-11-08 DIAGNOSIS — E785 Hyperlipidemia, unspecified: Secondary | ICD-10-CM | POA: Diagnosis not present

## 2023-11-08 DIAGNOSIS — I700 Atherosclerosis of aorta: Secondary | ICD-10-CM | POA: Diagnosis not present

## 2023-11-08 DIAGNOSIS — M059 Rheumatoid arthritis with rheumatoid factor, unspecified: Secondary | ICD-10-CM | POA: Diagnosis present

## 2023-11-08 DIAGNOSIS — I6782 Cerebral ischemia: Secondary | ICD-10-CM | POA: Diagnosis not present

## 2023-11-08 DIAGNOSIS — G459 Transient cerebral ischemic attack, unspecified: Secondary | ICD-10-CM | POA: Diagnosis not present

## 2023-11-08 DIAGNOSIS — E663 Overweight: Secondary | ICD-10-CM | POA: Diagnosis not present

## 2023-11-08 DIAGNOSIS — R4701 Aphasia: Secondary | ICD-10-CM | POA: Diagnosis not present

## 2023-11-08 DIAGNOSIS — I63522 Cerebral infarction due to unspecified occlusion or stenosis of left anterior cerebral artery: Secondary | ICD-10-CM | POA: Insufficient documentation

## 2023-11-08 DIAGNOSIS — M1990 Unspecified osteoarthritis, unspecified site: Secondary | ICD-10-CM | POA: Diagnosis not present

## 2023-11-08 DIAGNOSIS — Z1152 Encounter for screening for COVID-19: Secondary | ICD-10-CM | POA: Insufficient documentation

## 2023-11-08 DIAGNOSIS — Z8673 Personal history of transient ischemic attack (TIA), and cerebral infarction without residual deficits: Secondary | ICD-10-CM | POA: Insufficient documentation

## 2023-11-08 DIAGNOSIS — R4789 Other speech disturbances: Secondary | ICD-10-CM | POA: Diagnosis present

## 2023-11-08 LAB — DIFFERENTIAL
Abs Immature Granulocytes: 0.01 10*3/uL (ref 0.00–0.07)
Basophils Absolute: 0 10*3/uL (ref 0.0–0.1)
Basophils Relative: 1 %
Eosinophils Absolute: 0.1 10*3/uL (ref 0.0–0.5)
Eosinophils Relative: 1 %
Immature Granulocytes: 0 %
Lymphocytes Relative: 35 %
Lymphs Abs: 1.8 10*3/uL (ref 0.7–4.0)
Monocytes Absolute: 0.5 10*3/uL (ref 0.1–1.0)
Monocytes Relative: 9 %
Neutro Abs: 2.9 10*3/uL (ref 1.7–7.7)
Neutrophils Relative %: 54 %

## 2023-11-08 LAB — COMPREHENSIVE METABOLIC PANEL WITH GFR
ALT: 18 U/L (ref 0–44)
AST: 21 U/L (ref 15–41)
Albumin: 3.7 g/dL (ref 3.5–5.0)
Alkaline Phosphatase: 110 U/L (ref 38–126)
Anion gap: 9 (ref 5–15)
BUN: 15 mg/dL (ref 8–23)
CO2: 23 mmol/L (ref 22–32)
Calcium: 8.8 mg/dL — ABNORMAL LOW (ref 8.9–10.3)
Chloride: 103 mmol/L (ref 98–111)
Creatinine, Ser: 0.62 mg/dL (ref 0.44–1.00)
GFR, Estimated: >60 mL/min (ref >60)
Glucose, Bld: 92 mg/dL (ref 70–99)
Potassium: 3.8 mmol/L (ref 3.5–5.1)
Sodium: 135 mmol/L (ref 135–145)
Total Bilirubin: 0.6 mg/dL (ref 0.0–1.2)
Total Protein: 7.3 g/dL (ref 6.5–8.1)

## 2023-11-08 LAB — HEMOGLOBIN A1C
Hgb A1c MFr Bld: 5.3 % (ref 4.8–5.6)
Mean Plasma Glucose: 105.41 mg/dL

## 2023-11-08 LAB — PROTIME-INR
INR: 1.1 (ref 0.8–1.2)
Prothrombin Time: 14.1 s (ref 11.4–15.2)

## 2023-11-08 LAB — CBC
HCT: 38.1 % (ref 36.0–46.0)
Hemoglobin: 12.9 g/dL (ref 12.0–15.0)
MCH: 30.7 pg (ref 26.0–34.0)
MCHC: 33.9 g/dL (ref 30.0–36.0)
MCV: 90.7 fL (ref 80.0–100.0)
Platelets: 284 10*3/uL (ref 150–400)
RBC: 4.2 MIL/uL (ref 3.87–5.11)
RDW: 14.8 % (ref 11.5–15.5)
WBC: 5.3 10*3/uL (ref 4.0–10.5)
nRBC: 0 % (ref 0.0–0.2)

## 2023-11-08 LAB — APTT: aPTT: 31 s (ref 24–36)

## 2023-11-08 LAB — ETHANOL: Alcohol, Ethyl (B): <15 mg/dL (ref <15)

## 2023-11-08 MED ORDER — ENOXAPARIN SODIUM 40 MG/0.4ML IJ SOSY
40.0000 mg | PREFILLED_SYRINGE | INTRAMUSCULAR | Status: DC
  Administered 2023-11-08: 40 mg via SUBCUTANEOUS
  Filled 2023-11-08: qty 0.4

## 2023-11-08 MED ORDER — STROKE: EARLY STAGES OF RECOVERY BOOK: Freq: Once | Status: AC

## 2023-11-08 MED ORDER — METHOTREXATE 2.5 MG PO TABS: 17.5000 mg | ORAL_TABLET | ORAL | Status: DC

## 2023-11-08 MED ORDER — ACETAMINOPHEN 325 MG RE SUPP: 650.0000 mg | RECTAL | Status: DC | PRN

## 2023-11-08 MED ORDER — ASPIRIN 81 MG PO CHEW
324.0000 mg | CHEWABLE_TABLET | Freq: Once | ORAL | Status: AC
  Administered 2023-11-08: 324 mg via ORAL
  Filled 2023-11-08: qty 4

## 2023-11-08 MED ORDER — ACETAMINOPHEN 160 MG/5ML PO SOLN: 650.0000 mg | ORAL | Status: DC | PRN

## 2023-11-08 MED ORDER — ACETAMINOPHEN 325 MG PO TABS: 650.0000 mg | ORAL_TABLET | ORAL | Status: DC | PRN

## 2023-11-08 MED ORDER — HYDRALAZINE HCL 20 MG/ML IJ SOLN: 5.0000 mg | INTRAMUSCULAR | Status: DC | PRN

## 2023-11-08 MED ORDER — FAMOTIDINE 20 MG PO TABS: 20.0000 mg | ORAL_TABLET | Freq: Every day | ORAL | Status: DC | PRN

## 2023-11-08 MED ORDER — VITAMIN B-12 1000 MCG PO TABS
1000.0000 ug | ORAL_TABLET | Freq: Every day | ORAL | Status: DC
  Administered 2023-11-09: 1000 ug via ORAL
  Filled 2023-11-08: qty 1

## 2023-11-08 MED ORDER — FOLIC ACID 1 MG PO TABS
1.0000 mg | ORAL_TABLET | Freq: Every day | ORAL | Status: DC
  Administered 2023-11-09: 1 mg via ORAL
  Filled 2023-11-08: qty 1

## 2023-11-08 MED ORDER — ROSUVASTATIN CALCIUM 20 MG PO TABS: 20.0000 mg | ORAL_TABLET | ORAL | Status: DC

## 2023-11-08 MED ORDER — IOHEXOL 350 MG/ML SOLN
75.0000 mL | Freq: Once | INTRAVENOUS | Status: AC | PRN
  Administered 2023-11-08: 75 mL via INTRAVENOUS

## 2023-11-08 MED ORDER — ASPIRIN 81 MG PO CHEW
324.0000 mg | CHEWABLE_TABLET | Freq: Every day | ORAL | Status: DC
  Administered 2023-11-09: 324 mg via ORAL
  Filled 2023-11-08: qty 4

## 2023-11-08 MED ORDER — ONDANSETRON HCL 4 MG/2ML IJ SOLN: 4.0000 mg | Freq: Three times a day (TID) | INTRAMUSCULAR | Status: DC | PRN

## 2023-11-08 MED ORDER — ADULT MULTIVITAMIN W/MINERALS CH
1.0000 | ORAL_TABLET | Freq: Every day | ORAL | Status: DC
  Administered 2023-11-09: 1 via ORAL
  Filled 2023-11-08: qty 1

## 2023-11-08 MED ORDER — DONEPEZIL HCL 5 MG PO TABS
5.0000 mg | ORAL_TABLET | Freq: Every day | ORAL | Status: DC
  Administered 2023-11-09: 5 mg via ORAL
  Filled 2023-11-08: qty 1

## 2023-11-08 MED ORDER — SENNOSIDES-DOCUSATE SODIUM 8.6-50 MG PO TABS: 1.0000 | ORAL_TABLET | Freq: Every evening | ORAL | Status: DC | PRN

## 2023-11-08 NOTE — Telephone Encounter (Signed)
Called and notified patient of Jolene's message. Patient verbalized understanding.

## 2023-11-08 NOTE — Telephone Encounter (Signed)
 Copied from CRM [id number]. Topic: Clinical - Red Word Triage >> Nov 08, 2023 10:51 AM Montie POUR wrote: Red Word that prompted transfer to Nurse Triage:   She is stuttering when trying to get her words out when speaking. no other symptoms. This has been going on since the first of June.  ----------------------------------------------------------------------- >> Nov 08, 2023 10:52 AM Montie POUR wrote: Stuttering is getting worse Reason for Disposition  [1] Loss of speech or garbled speech AND [2] gradual onset (e.g., days to weeks) AND [3] present now  Answer Assessment - Initial Assessment Questions 1. DESCRIPTION: Tell me more about this problem. Are you  having trouble swallowing liquids, solids, or both? Any trouble with swallowing saliva (spit)?     Sometimes when drinking water, I have trouble sw 2. SEVERITY: How bad is the swallowing difficulty?  (e.g., Scale 1-10; or mild, moderate, severe)   - MILD (0-3): Occasional swallowing difficulty; has trouble swallowing certain types of foods or liquids.   - MODERATE (4-7): Frequent swallowing difficulty; only able to swallow small amounts of foods and fluids.   - SEVERE (8-10): Unable to swallow any foods, fluids, or saliva; sensation of lump in throat or something stuck in throat, and frequent drooling or spitting may be present.     *No Answer* 3. ONSET: When did the swallowing problems begin?      *No Answer* 4. CAUSE: What do you think is causing the problem?  (e.g., dry mouth, food or pill stuck in throat, mouth pain, sore throat, progression of disease process such as dementia or Parkinson's disease).      *No Answer* 5. CHRONIC or RECURRENT: Is this a new problem for you?  If No, ask: How long have you had this problem? (e.g., days, weeks, months)      *No Answer* 6. OTHER SYMPTOMS: Do you have any other symptoms? (e.g., chest pain, difficulty breathing, mouth sores, sore throat, swollen tongue, chest pain)     *No  Answer* 7. PREGNANCY: Is there any chance you are pregnant? When was your last menstrual period?     *No Answer*  Answer Assessment - Initial Assessment Questions 1. SYMPTOM: What is the main symptom you are concerned about? (e.g., weakness, numbness)     Stuttering speech  2. ONSET: When did this start? (minutes, hours, days; while sleeping)     3 weeks ago  3. LAST NORMAL: When was the last time you (the patient) were normal (no symptoms)?     Approx 3 week ago  4. PATTERN Does this come and go, or has it been constant since it started?  Is it present now?     Comes and goes, unsure of triggers  5. CARDIAC SYMPTOMS: Have you had any of the following symptoms: chest pain, difficulty breathing, palpitations?     Denies Cardiac symptoms  6. NEUROLOGIC SYMPTOMS: Have you had any of the following symptoms: headache, dizziness, vision loss, double vision, changes in speech, unsteady on your feet?     No  7. OTHER SYMPTOMS: Do you have any other symptoms?     No other symptoms  8. PREGNANCY: Is there any chance you are pregnant? When was your last menstrual period?     No  Protocols used: Neurologic Deficit-A-AH, Swallowing Difficulty-A-AH

## 2023-11-08 NOTE — ED Triage Notes (Signed)
 Pt to ED POV from home for stroke workup. Pt has been having episodes of word finding difficulty intermittently since early June. Had an episode this morning. Not sure duration of episode. Called PCP, told to come here for MRI. Denies vision changes, headaches. Speech is clear. Gait is steady. No motor weakness noted.

## 2023-11-08 NOTE — H&P (Signed)
 History and Physical    Angela Watts FMW:[id number] DOB: 04-01-1951 DOA: 11/08/2023  Referring MD/NP/PA:   PCP: Watts, Angela T, NP   Patient coming from:  The patient is coming from home.     Chief Complaint: Difficulty speaking  HPI: Angela Watts is a 73 y.o. female with medical history significant of hypertension, hyperlipidemia, GERD, memory change, psoriasis, rheumatoid arthritis on methotrexate, who presents with difficulty speaking.  Patient states that she has mild intermittent difficulty speaking since earlier June. Toady, at about 10:00 AM, she had another episode of word finding difficulties that lasted for about 5-10 minutes, resolved spontaneously.  Denies unilateral numbness or tingling in extremities.  No facial droop, vision loss or hearing loss. Pt states that she was talking to her daughter and then unable to get any of her words out; she felt like she knew what she wanted to say but could not speak. Called PCP who told patient come to ED for further evaluation and treatment.  When I saw patient in the ED, she is asymptomatic.  Her difficulty speaking has resolved.  Denies chest pain, cough, SOB.  No fever or chills.  No nausea, vomiting, diarrhea or abdominal pain.  No symptoms of UTI.  She states that she ran out of donepezil  pill 2 weeks ago.  Data reviewed independently and ED Course: pt was found to have WBC 5.3, GFR> 60, INR 1.1, PTT 31, temperature normal, blood pressure 165/69, heart rate 83, RR 20, oxygen saturation 97% on room air.  CT of head is negative for acute intracranial abnormalities.  Patient is placed in telemetry bed for observation.   EKG: I have personally reviewed.  Sinus rhythm, QTc 418, early R wave progression.   Review of Systems:   General: no fevers, chills, no body weight gain, fatigue HEENT: no blurry vision, hearing changes or sore throat Respiratory: no dyspnea, coughing, wheezing CV: no chest pain, no palpitations GI: no  nausea, vomiting, abdominal pain, diarrhea, constipation GU: no dysuria, burning on urination, increased urinary frequency, hematuria  Ext: no leg edema Neuro: no unilateral weakness, numbness, or tingling, no vision change or hearing loss.  Has difficulty speaking Skin: no rash, no skin tear. MSK: No muscle spasm, no deformity, no limitation of range of movement in spin Heme: No easy bruising.  Travel history: No recent long distant travel.   Allergy:  Allergies  Allergen Reactions   Prednisone Itching    Past Medical History:  Diagnosis Date   Abnormal Pap smear of cervix    Abscess of bursa, left hip    Arthritis    Colon adenomas    GERD (gastroesophageal reflux disease)    Hyperlipidemia    Hypertension    Psoriasis    Rheumatoid arthritis flare (HCC)    TIA (transient ischemic attack) 10/2023    Past Surgical History:  Procedure Laterality Date   ABDOMINAL HYSTERECTOMY  2003   BREAST BIOPSY Left 06/01/2016   us  bx done by dr byrnett 6:00 2cmfn intraducal papilloma with calcs   BREAST BIOPSY Left 02/05/2019   Affirm bx-X clip-benign parenchyma with fragments of sclerosed intraductal papilloma   BREAST BIOPSY Left 2012   us  bx done by Dr. Dessa, intraductal papilloma   BREAST CYST ASPIRATION Left 2011   NEG/ Br Byrnett   BREAST CYST ASPIRATION Left 06/01/2016   dr byrnett 5:00 3cmfn aspirated with complete resolution   COLONOSCOPY WITH PROPOFOL  N/A 06/11/2015   Procedure: COLONOSCOPY WITH PROPOFOL ;  Surgeon: Lamar T  Viktoria, MD;  Location: [facility] ENDOSCOPY;  Service: Endoscopy;  Laterality: N/A;   COLONOSCOPY WITH PROPOFOL  N/A 07/14/2018   Procedure: COLONOSCOPY WITH PROPOFOL ;  Surgeon: Viktoria Lamar DASEN, MD;  Location: [facility] ENDOSCOPY;  Service: Endoscopy;  Laterality: N/A;   CYST EXCISION Left    breast    Social History:  reports that she has never smoked. She has never used smokeless tobacco. She reports current alcohol use. She reports that she does not use  drugs.  Family History:  Family History  Problem Relation Age of Onset   Hypertension Mother    Arthritis Mother    Breast cancer Mother        71   Cancer Mother        breast   Hypertension Father    Lung cancer Father    Diabetes Sister    Hypertension Sister    Hypertension Sister    Hypertension Sister    Hypertension Sister    Hypertension Brother    Hypertension Brother    Cancer Brother        lung     Prior to Admission medications   Medication Sig Start Date End Date Taking? Authorizing Provider  amLODipine  (NORVASC ) 5 MG tablet Take 1 tablet (5 mg total) by mouth daily. 11/05/22   Watts, Angela T, NP  cyanocobalamin  (VITAMIN B12) 1000 MCG tablet Take 1,000 mcg by mouth daily.    [provider]  donepezil  (ARICEPT ) 5 MG tablet Take 1 tablet (5 mg total) by mouth at bedtime. 07/05/23   Watts, Angela T, NP  famotidine (PEPCID) 20 MG tablet Take 20 mg by mouth daily as needed for heartburn or indigestion.    [provider]  folic acid  (FOLVITE ) 1 MG tablet Take 1 tablet by mouth daily. 08/03/17   [provider]  methotrexate (RHEUMATREX) 2.5 MG tablet Take 7 tabs (17.5 MG ) once a week for 12 weeks 02/02/18   [provider]  Multiple Vitamin (MULTIVITAMIN) capsule Take 1 capsule by mouth daily.    [provider]  rosuvastatin  (CRESTOR ) 20 MG tablet TAKE 1 TABLET THREE TIMES WEEKLY ON MONDAY, WEDNESDAY, AND FRIDAY AT 6 PM 04/13/23   Watts, Angela T, NP    Physical Exam: Vitals:   11/08/23 1739 11/08/23 1742 11/08/23 2045  BP:  (!) 165/69 (!) 147/52  Pulse:  83 73  Resp:  20 16  Temp:  98.7 F (37.1 C) 97.9 F (36.6 C)  TempSrc:  Oral Oral  SpO2:  97% 98%  Weight: 83.9 kg  (!) 188.1 kg  Height: 5' 6 (1.676 m)  5' 6 (1.676 m)   General: Not in acute distress HEENT:       Eyes: PERRL, EOMI, no jaundice       ENT: No discharge from the ears and nose, no pharynx injection, no tonsillar enlargement.         Neck: No JVD, no bruit, no mass felt. Heme: No neck lymph node enlargement. Cardiac: S1/S2, RRR, No murmurs, No gallops or rubs. Respiratory: No rales, wheezing, rhonchi or rubs. GI: Soft, nondistended, nontender, no rebound pain, no organomegaly, BS present. GU: No hematuria Ext: No pitting leg edema bilaterally. 1+DP/PT pulse bilaterally. Musculoskeletal: No joint deformities, No joint redness or warmth, no limitation of ROM in spin. Skin: No rashes.  Neuro: Alert, oriented X3, cranial nerves II-XII grossly intact, moves all extremities normally. Muscle strength 5/5 in all extremities, sensation to light touch intact.  Psych: Patient is not  psychotic, no suicidal or hemocidal ideation.  Labs on Admission: I have personally reviewed following labs and imaging studies  CBC: Recent Labs  Lab 11/08/23 1743  WBC 5.3  NEUTROABS 2.9  HGB 12.9  HCT 38.1  MCV 90.7  PLT 284   Basic Metabolic Panel: Recent Labs  Lab 11/08/23 1743  NA 135  K 3.8  CL 103  CO2 23  GLUCOSE 92  BUN 15  CREATININE 0.62  CALCIUM  8.8*   GFR: Estimated Creatinine Clearance: 106.3 mL/min (by C-G formula based on SCr of 0.62 mg/dL). Liver Function Tests: Recent Labs  Lab 11/08/23 1743  AST 21  ALT 18  ALKPHOS 110  BILITOT 0.6  PROT 7.3  ALBUMIN 3.7   No results for input(s): LIPASE, AMYLASE in the last 168 hours. No results for input(s): AMMONIA in the last 168 hours. Coagulation Profile: Recent Labs  Lab 11/08/23 1743  INR 1.1   Cardiac Enzymes: No results for input(s): CKTOTAL, CKMB, CKMBINDEX, TROPONINI in the last 168 hours. BNP (last 3 results) No results for input(s): PROBNP in the last 8760 hours. HbA1C: No results for input(s): HGBA1C in the last 72 hours. CBG: No results for input(s): GLUCAP in the last 168 hours. Lipid Profile: No results for input(s): CHOL, HDL, LDLCALC, TRIG, CHOLHDL, LDLDIRECT in the last 72 hours. Thyroid  Function  Tests: No results for input(s): TSH, T4TOTAL, FREET4, T3FREE, THYROIDAB in the last 72 hours. Anemia Panel: No results for input(s): VITAMINB12, FOLATE, FERRITIN, TIBC, IRON, RETICCTPCT in the last 72 hours. Urine analysis:    Component Value Date/Time   COLORURINE STRAW (A) 09/18/2023 1545   APPEARANCEUR CLEAR (A) 09/18/2023 1545   APPEARANCEUR Clear 12/24/2016 1338   LABSPEC 1.032 (H) 09/18/2023 1545   PHURINE 6.0 09/18/2023 1545   GLUCOSEU NEGATIVE 09/18/2023 1545   HGBUR NEGATIVE 09/18/2023 1545   BILIRUBINUR NEGATIVE 09/18/2023 1545   BILIRUBINUR Negative 12/24/2016 1338   KETONESUR 5 (A) 09/18/2023 1545   PROTEINUR NEGATIVE 09/18/2023 1545   NITRITE NEGATIVE 09/18/2023 1545   LEUKOCYTESUR NEGATIVE 09/18/2023 1545   Sepsis Labs: @LABRCNTIP (procalcitonin:4,lacticidven:4) )No results found for this or any previous visit (from the past 240 hours).   Radiological Exams on Admission:   Assessment/Plan Principal Problem:   TIA (transient ischemic attack) Active Problems:   Hypertension   Hyperlipidemia   Rheumatoid arthritis, seropositive (HCC)   Memory changes   Morbid obesity with BMI of 60.0-69.9, adult (HCC)   Assessment and Plan:   TIA (transient ischemic attack): Patient is symptoms are concerning for TIA.  CT head is negative for acute intracranial abnormalities.  Will do TIA workup.  -Placed on tele bed for observation - Obtain MRI-brain  - CTA of  head and neck - will hold oral Bp meds to allow permissive HTN  - start ASA 324 mg daily - Statin: Crestor  20 mg daily - fasting lipid panel and HbA1c  - 2D transthoracic echocardiography  - swallowing screen. If fails, will get SLP - PT/OT consult    Hypertension: -Hold amlodipine  - prn IV hydralazine for SBP>220 or dBP>110    Hyperlipidemia -Continue Crestor     Rheumatoid arthritis, seropositive (HCC) -Weekly methotrexate    Memory changes -Donepezil     Morbid obesity with  BMI of 60.0-69.9, adult [facility]): Patient has Obesity Class III, with body weight body weight 188.1 kg, BMI 66.9  kg/m2.  - Encourage losing weight - Exercise and healthy diet     DVT ppx: SQ Lovenox  Code Status: Full code  Family Communication:    Yes, patient's sister  at bed side.      Disposition Plan:  Anticipate discharge back to previous environment  Consults called:  none  Admission status and Level of care: Telemetry Medical:    for obs     Dispo: The patient is from: Home              Anticipated d/c is to: Home              Anticipated d/c date is: 1 day              Patient currently is not medically stable to d/c.    Severity of Illness:  The appropriate patient status for this patient is OBSERVATION. Observation status is judged to be reasonable and necessary in order to provide the required intensity of service to ensure the patient's safety. The patient's presenting symptoms, physical exam findings, and initial radiographic and laboratory data in the context of their medical condition is felt to place them at decreased risk for further clinical deterioration. Furthermore, it is anticipated that the patient will be medically stable for discharge from the hospital within 2 midnights of admission.        Date of Service 11/08/2023    Caleb Exon [facility]   If 7PM-7AM, please contact night-coverage [email or web address] 11/08/2023, 10:06 PM

## 2023-11-08 NOTE — Progress Notes (Signed)
 BP 138/78   Pulse 81   Ht 5' 7 (1.702 m)   Wt 185 lb (83.9 kg)   LMP  (LMP Unknown)   SpO2 97%   BMI 28.98 kg/m    Subjective:    Patient ID: Angela Watts, female    DOB: [birth year], 73 y.o.   MRN: [id number]  HPI: Angela Watts is a 73 y.o. female  Chief Complaint  Patient presents with   Speech Problem    Off and on started beginning of the month   Patient presents to clinic with complaints of having trouble having her words come out.  Feels like she is stuttering.  This has been going on since the beginning of the month.  Feels like its getting worse.  Other people have noticed she isn't able to get her words out.  Denies any difficult with balance, vision, dizziness, headaches, weakness.  Patient states when she wakes up in the morning the symptoms are worse.     Relevant past medical, surgical, family and social history reviewed and updated as indicated. Interim medical history since our last visit reviewed. Allergies and medications reviewed and updated.  Review of Systems  Eyes:  Negative for visual disturbance.  Neurological:  Positive for speech difficulty. Negative for dizziness, weakness and headaches.       No difficulty with balance    Per HPI unless specifically indicated above     Objective:    BP 138/78   Pulse 81   Ht 5' 7 (1.702 m)   Wt 185 lb (83.9 kg)   LMP  (LMP Unknown)   SpO2 97%   BMI 28.98 kg/m   Wt Readings from Last 3 Encounters:  11/08/23 185 lb (83.9 kg)  09/22/23 189 lb (85.7 kg)  09/18/23 190 lb (86.2 kg)    Physical Exam Vitals and nursing note reviewed.  Constitutional:      General: She is not in acute distress.    Appearance: Normal appearance. She is normal weight. She is not ill-appearing, toxic-appearing or diaphoretic.  HENT:     Head: Normocephalic.     Right Ear: External ear normal.     Left Ear: External ear normal.     Nose: Nose normal.     Mouth/Throat:     Mouth: Mucous membranes are moist.      Pharynx: Oropharynx is clear.   Eyes:     General:        Right eye: No discharge.        Left eye: No discharge.     Extraocular Movements: Extraocular movements intact.     Conjunctiva/sclera: Conjunctivae normal.     Pupils: Pupils are equal, round, and reactive to light.    Cardiovascular:     Rate and Rhythm: Normal rate and regular rhythm.     Heart sounds: No murmur heard. Pulmonary:     Effort: Pulmonary effort is normal. No respiratory distress.     Breath sounds: Normal breath sounds. No wheezing or rales.   Musculoskeletal:     Cervical back: Normal range of motion and neck supple.   Skin:    General: Skin is warm and dry.     Capillary Refill: Capillary refill takes less than 2 seconds.   Neurological:     General: No focal deficit present.     Mental Status: She is alert and oriented to person, place, and time. Mental status is at baseline.     Cranial Nerves: Cranial  nerves 2-12 are intact.     Sensory: Sensation is intact.     Motor: Motor function is intact.     Coordination: Romberg sign positive.   Psychiatric:        Mood and Affect: Mood normal.        Behavior: Behavior normal.        Thought Content: Thought content normal.        Judgment: Judgment normal.     Results for orders placed or performed during the hospital encounter of 09/18/23  Basic metabolic panel   Collection Time: 09/18/23 11:50 AM  Result Value Ref Range   Sodium 139 135 - 145 mmol/L   Potassium 3.5 3.5 - 5.1 mmol/L   Chloride 106 98 - 111 mmol/L   CO2 26 22 - 32 mmol/L   Glucose, Bld 103 (H) 70 - 99 mg/dL   BUN 16 8 - 23 mg/dL   Creatinine, Ser 9.21 0.44 - 1.00 mg/dL   Calcium  9.0 8.9 - 10.3 mg/dL   GFR, Estimated >39 >39 mL/min   Anion gap 7 5 - 15  CBC   Collection Time: 09/18/23 11:50 AM  Result Value Ref Range   WBC 4.1 4.0 - 10.5 K/uL   RBC 4.35 3.87 - 5.11 MIL/uL   Hemoglobin 12.9 12.0 - 15.0 g/dL   HCT 60.7 63.9 - 53.9 %   MCV 90.1 80.0 - 100.0 fL    MCH 29.7 26.0 - 34.0 pg   MCHC 32.9 30.0 - 36.0 g/dL   RDW 85.3 88.4 - 84.4 %   Platelets 299 150 - 400 K/uL   nRBC 0.0 0.0 - 0.2 %  Hepatic function panel   Collection Time: 09/18/23 11:50 AM  Result Value Ref Range   Total Protein 7.3 6.5 - 8.1 g/dL   Albumin 3.6 3.5 - 5.0 g/dL   AST 22 15 - 41 U/L   ALT 20 0 - 44 U/L   Alkaline Phosphatase 106 38 - 126 U/L   Total Bilirubin 0.5 0.0 - 1.2 mg/dL   Bilirubin, Direct <9.8 0.0 - 0.2 mg/dL   Indirect Bilirubin NOT CALCULATED 0.3 - 0.9 mg/dL  Lipase, blood   Collection Time: 09/18/23 11:50 AM  Result Value Ref Range   Lipase 25 11 - 51 U/L  Troponin I (High Sensitivity)   Collection Time: 09/18/23 11:50 AM  Result Value Ref Range   Troponin I (High Sensitivity) 5 <18 ng/L  Urinalysis, Routine w reflex microscopic -Urine, Clean Catch   Collection Time: 09/18/23  3:45 PM  Result Value Ref Range   Color, Urine STRAW (A) YELLOW   APPearance CLEAR (A) CLEAR   Specific Gravity, Urine 1.032 (H) 1.005 - 1.030   pH 6.0 5.0 - 8.0   Glucose, UA NEGATIVE NEGATIVE mg/dL   Hgb urine dipstick NEGATIVE NEGATIVE   Bilirubin Urine NEGATIVE NEGATIVE   Ketones, ur 5 (A) NEGATIVE mg/dL   Protein, ur NEGATIVE NEGATIVE mg/dL   Nitrite NEGATIVE NEGATIVE   Leukocytes,Ua NEGATIVE NEGATIVE      Assessment & Plan:   Problem List Items Addressed This Visit   None Visit Diagnoses       Aphasia    -  Primary   Difficulty with speech. Abnormal balance on exam. Will rule out recent stroke via MRI. Follow up with PCP in 3 weeks. May benefit from speech therapy.   Relevant Orders   MR Brain W Wo Contrast   Comp Met (CMET)  Follow up plan: Return in about 3 weeks (around 11/29/2023) for FU speech problem with Jolene.

## 2023-11-08 NOTE — ED Provider Notes (Signed)
 [facility] Provider Note    Event Date/Time   First MD Initiated Contact with Patient 11/08/23 1801     (approximate)   History   Transient Ischemic Attack   HPI  Angela Watts is a 73 y.o. female past medical history significant for rheumatoid arthritis, hypertension, who presents to the emergency department for word finding difficulties.  Patient had an episode this morning of word finding difficulties that lasted for 5 or 10 minutes.  States that she was talking to her daughter and then unable to get any of her words out.  States that she felt like she knew what she wanted to say but could not speak.  States that this is happened intermittently in the past but today was the most recent episode.  Evaluated by her primary care physician and told to come to the emergency department for further workup.  Denies any falls or head trauma.  States that her speech is back to her normal at this time.  Does state that she was on a memory pill but it ran out 2 weeks ago, states that there were no refills.  Denies any headaches, change in vision, extremity numbness or weakness.  No prior history of TIA or CVA.  No recent falls or head trauma     Physical Exam   Triage Vital Signs: ED Triage Vitals  Encounter Vitals Group     BP 11/08/23 1742 (!) 165/69     Girls Systolic BP Percentile --      Girls Diastolic BP Percentile --      Boys Systolic BP Percentile --      Boys Diastolic BP Percentile --      Pulse Rate 11/08/23 1742 83     Resp 11/08/23 1742 20     Temp 11/08/23 1742 98.7 F (37.1 C)     Temp Source 11/08/23 1742 Oral     SpO2 11/08/23 1742 97 %     Weight 11/08/23 1739 185 lb (83.9 kg)     Height 11/08/23 1739 5' 6 (1.676 m)     Head Circumference --      Peak Flow --      Pain Score 11/08/23 1738 0     Pain Loc --      Pain Education --      Exclude from Growth Chart --     Most recent vital signs: Vitals:   11/08/23 1742  BP: (!)  165/69  Pulse: 83  Resp: 20  Temp: 98.7 F (37.1 C)  SpO2: 97%    Physical Exam Constitutional:      Appearance: She is well-developed.  HENT:     Head: Atraumatic.   Eyes:     Conjunctiva/sclera: Conjunctivae normal.    Cardiovascular:     Rate and Rhythm: Regular rhythm.  Pulmonary:     Effort: No respiratory distress.  Abdominal:     General: There is no distension.     Tenderness: There is no abdominal tenderness.   Musculoskeletal:        General: Normal range of motion.     Cervical back: Normal range of motion.   Skin:    General: Skin is warm.   Neurological:     Mental Status: She is alert. Mental status is at baseline.     GCS: GCS eye subscore is 4. GCS verbal subscore is 5. GCS motor subscore is 6.     Cranial Nerves: Cranial nerves 2-12 are  intact.     Sensory: Sensation is intact.     Motor: Motor function is intact.     Coordination: Coordination is intact.     IMPRESSION / MDM / ASSESSMENT AND PLAN / ED COURSE  I reviewed the triage vital signs and the nursing notes.  Differential diagnosis including intracranial hemorrhage, TIA, CVA, electrolyte abnormality, dehydration  EKG  I, Clotilda Punter, the attending physician, personally viewed and interpreted this ECG.   Rate: Normal  Rhythm: Normal sinus  Axis: Normal  Intervals: Normal  ST&T Change: None  No tachycardic or bradycardic dysrhythmias while on cardiac telemetry.  RADIOLOGY CT scan of the head without signs of intracranial hemorrhage.  LABS (all labs ordered are listed, but only abnormal results are displayed) Labs interpreted as -    Labs Reviewed  COMPREHENSIVE METABOLIC PANEL WITH GFR - Abnormal; Notable for the following components:      Result Value   Calcium  8.8 (*)    All other components within normal limits  PROTIME-INR  APTT  CBC  DIFFERENTIAL  ETHANOL  CBG MONITORING, ED     MDM    No significant lab work abnormality.  No leukocytosis.  No signs or  symptoms concerning for an infectious process.  No urinary symptoms.  No chest pain or shortness of breath have a low suspicion for ACS.  Patient is back to her baseline.  Low suspicion for seizure.  Clinical picture most concerning for TIA.  ABCD score of 3.  Given aspirin. consulted hospitalist for admission   PROCEDURES:  Critical Care performed: No  Procedures  Patient's presentation is most consistent with acute presentation with potential threat to life or bodily function.   MEDICATIONS ORDERED IN ED: Medications  aspirin chewable tablet 324 mg (has no administration in time range)    FINAL CLINICAL IMPRESSION(S) / ED DIAGNOSES   Final diagnoses:  Aphasia     Rx / DC Orders   ED Discharge Orders     None        Note:  This document was prepared using Dragon voice recognition software and may include unintentional dictation errors.   Punter Clotilda, MD 11/08/23 1850

## 2023-11-09 ENCOUNTER — Ambulatory Visit: Payer: Self-pay | Admitting: Nurse Practitioner

## 2023-11-09 ENCOUNTER — Other Ambulatory Visit: Payer: Self-pay

## 2023-11-09 ENCOUNTER — Observation Stay: Attending: Cardiology

## 2023-11-09 ENCOUNTER — Other Ambulatory Visit: Payer: Self-pay | Admitting: Cardiology

## 2023-11-09 ENCOUNTER — Observation Stay
Admit: 2023-11-09 | Discharge: 2023-11-09 | Disposition: A | Discharge status: Home / Self Care | Attending: Internal Medicine | Admitting: Internal Medicine

## 2023-11-09 DIAGNOSIS — I639 Cerebral infarction, unspecified: Secondary | ICD-10-CM

## 2023-11-09 DIAGNOSIS — Z6841 Body Mass Index (BMI) 40.0 and over, adult: Secondary | ICD-10-CM | POA: Diagnosis not present

## 2023-11-09 DIAGNOSIS — I63522 Cerebral infarction due to unspecified occlusion or stenosis of left anterior cerebral artery: Secondary | ICD-10-CM

## 2023-11-09 DIAGNOSIS — G459 Transient cerebral ischemic attack, unspecified: Secondary | ICD-10-CM

## 2023-11-09 DIAGNOSIS — Z8673 Personal history of transient ischemic attack (TIA), and cerebral infarction without residual deficits: Secondary | ICD-10-CM | POA: Insufficient documentation

## 2023-11-09 DIAGNOSIS — M059 Rheumatoid arthritis with rheumatoid factor, unspecified: Secondary | ICD-10-CM | POA: Diagnosis not present

## 2023-11-09 LAB — COMPREHENSIVE METABOLIC PANEL WITH GFR
ALT: 16 IU/L (ref 0–32)
AST: 19 IU/L (ref 0–40)
Albumin: 4.1 g/dL (ref 3.8–4.8)
Alkaline Phosphatase: 159 IU/L — ABNORMAL HIGH (ref 44–121)
BUN/Creatinine Ratio: 15 (ref 12–28)
BUN: 12 mg/dL (ref 8–27)
Bilirubin Total: 0.4 mg/dL (ref 0.0–1.2)
CO2: 22 mmol/L (ref 20–29)
Calcium: 9.5 mg/dL (ref 8.7–10.3)
Chloride: 105 mmol/L (ref 96–106)
Creatinine, Ser: 0.79 mg/dL (ref 0.57–1.00)
Globulin, Total: 2.9 g/dL (ref 1.5–4.5)
Glucose: 93 mg/dL (ref 70–99)
Potassium: 4.3 mmol/L (ref 3.5–5.2)
Sodium: 143 mmol/L (ref 134–144)
Total Protein: 7 g/dL (ref 6.0–8.5)
eGFR: 78 mL/min/{1.73_m2} (ref >59)

## 2023-11-09 LAB — ECHOCARDIOGRAM COMPLETE
AR max vel: 3.96 cm2
AV Area VTI: 3.83 cm2
AV Area mean vel: 3.88 cm2
AV Mean grad: 4 mmHg
AV Peak grad: 6.5 mmHg
Ao pk vel: 1.27 m/s
Area-P 1/2: 2.73 cm2
Height: 66 in
S' Lateral: 2.5 cm
Weight: 6634.96 [oz_av]

## 2023-11-09 LAB — LIPID PANEL
Cholesterol: 155 mg/dL (ref 0–200)
HDL: 63 mg/dL (ref >40)
LDL Cholesterol: 85 mg/dL (ref 0–99)
Total CHOL/HDL Ratio: 2.5 ratio
Triglycerides: 35 mg/dL (ref <150)
VLDL: 7 mg/dL (ref 0–40)

## 2023-11-09 MED ORDER — CLOPIDOGREL BISULFATE 75 MG PO TABS
75.0000 mg | ORAL_TABLET | Freq: Every day | ORAL | Status: DC
  Administered 2023-11-09: 75 mg via ORAL
  Filled 2023-11-09: qty 1

## 2023-11-09 MED ORDER — ROSUVASTATIN CALCIUM 20 MG PO TABS
20.0000 mg | ORAL_TABLET | Freq: Every day | ORAL | Status: DC
  Administered 2023-11-09: 20 mg via ORAL
  Filled 2023-11-09: qty 1

## 2023-11-09 MED ORDER — ASPIRIN 81 MG PO TBEC
81.0000 mg | DELAYED_RELEASE_TABLET | Freq: Every day | ORAL | 0 refills | Status: DC
  Filled 2023-11-09: qty 30, 30d supply, fill #0

## 2023-11-09 MED ORDER — METHOTREXATE 2.5 MG PO TABS
17.5000 mg | ORAL_TABLET | ORAL | Status: DC
  Filled 2023-11-09: qty 7

## 2023-11-09 MED ORDER — ROSUVASTATIN CALCIUM 20 MG PO TABS
20.0000 mg | ORAL_TABLET | Freq: Every day | ORAL | 0 refills | Status: DC
Start: 2023-11-09 — End: 2023-11-23
  Filled 2023-11-09: qty 30, 30d supply, fill #0

## 2023-11-09 MED ORDER — ATORVASTATIN CALCIUM 20 MG PO TABS: 40.0000 mg | ORAL_TABLET | Freq: Every day | ORAL | Status: DC

## 2023-11-09 MED ORDER — CLOPIDOGREL BISULFATE 75 MG PO TABS
75.0000 mg | ORAL_TABLET | Freq: Every day | ORAL | 0 refills | Status: AC
  Filled 2023-11-09: qty 21, 21d supply, fill #0

## 2023-11-09 NOTE — Hospital Course (Addendum)
 Angela Watts is a 73 y.o. female with medical history significant of hypertension, hyperlipidemia, GERD, memory change, psoriasis, rheumatoid arthritis on methotrexate, who presents with difficulty speaking.  Symptoms since has resolved after arriving the hospital. Stroke workup showed LDL of 85,CT angiogram of the neck and head did not show any significant occlusion.  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 Patient has been seen by neurology, recommended 21 days of Plavix and continued aspirin.  Also continue statin. ZIO recorder will be mailed to the patient by cardiology, followed by a loop recorder in the office.

## 2023-11-09 NOTE — Discharge Instructions (Signed)
 A zio monitor has been ordered for you. Please follow the package instructions for applying the monitor. If you have any questions, please call [facility] office at [phone]

## 2023-11-09 NOTE — Progress Notes (Signed)
 PT Cancellation Note  Patient Details Name: Angela Watts MRN: [id number] DOB: [birth year]   Cancelled Treatment:    Reason Eval/Treat Not Completed: PT screened, no needs identified, will sign off (Pt reports AMB in hallway, navigation of stairs with OT. PT and family endorse no acute abnormilty in these areas. No acute PT evaluation indicated at this time. PT signing OFF.)   [name] C 11/09/2023, 11:16 AM

## 2023-11-09 NOTE — Plan of Care (Signed)
   Problem: Education: Goal: Knowledge of General Education information will improve Description Including pain rating scale, medication(s)/side effects and non-pharmacologic comfort measures Outcome: Progressing   Problem: Health Behavior/Discharge Planning: Goal: Ability to manage health-related needs will improve Outcome: Progressing

## 2023-11-09 NOTE — Evaluation (Signed)
 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) 11/09/2023   TIA (transient ischemic attack) 11/08/2023   Morbid obesity with BMI of 60.0-69.9, adult (HCC) 11/08/2023   Right flank pain 09/22/2023   Depression, major, single episode, mild (HCC) 08/24/2023   Memory changes 05/09/2023   Acid reflux 11/05/2022   Long term methotrexate user 05/04/2019   Papilloma of left breast 01/29/2019   Advanced care planning/counseling discussion 04/13/2016   Osteoarthritis 02/13/2016   Psoriasis 10/14/2015   Rheumatoid arthritis, seropositive (HCC) 03/11/2015   Hypertension 03/11/2015   Hyperlipidemia 03/11/2015   Obesity 03/11/2015   Elevated alkaline phosphatase level 03/11/2015   Past Medical History:  Past Medical History:  Diagnosis Date   Abnormal Pap smear of cervix    Abscess of bursa, left hip    Arthritis    Colon adenomas    GERD (gastroesophageal reflux disease)    Hyperlipidemia    Hypertension    Psoriasis    Rheumatoid arthritis flare (HCC)    TIA (transient ischemic attack) 10/2023   Past Surgical History:  Past Surgical History:  Procedure Laterality Date   ABDOMINAL HYSTERECTOMY  2003   BREAST BIOPSY Left 06/01/2016   us  bx done by dr byrnett 6:00 2cmfn intraducal papilloma with calcs   BREAST BIOPSY Left 02/05/2019   Affirm bx-X clip-benign parenchyma with fragments of sclerosed intraductal papilloma   BREAST BIOPSY Left 2012   us  bx done by Dr. Dessa, intraductal papilloma   BREAST CYST ASPIRATION Left 2011   NEG/ Br Byrnett   BREAST CYST ASPIRATION Left 06/01/2016   dr byrnett 5:00 3cmfn aspirated with complete resolution   COLONOSCOPY WITH PROPOFOL  N/A 06/11/2015    Procedure: COLONOSCOPY WITH PROPOFOL ;  Surgeon: Angela ONEIDA Holmes, MD;  Location: [facility] ENDOSCOPY;  Service: Endoscopy;  Laterality: N/A;   COLONOSCOPY WITH PROPOFOL  N/A 07/14/2018   Procedure: COLONOSCOPY WITH PROPOFOL ;  Surgeon: Watts Angela ONEIDA, MD;  Location: [facility] ENDOSCOPY;  Service: Endoscopy;  Laterality: N/A;   CYST EXCISION Left    breast   HPI:  Angela Watts is a 73 y.o. female with medical history significant of hypertension, hyperlipidemia, GERD, memory change, psoriasis, rheumatoid arthritis on methotrexate, who presented with difficulty speaking at [facility] ED on 11/08/2023. Patient states that she has mild intermittent difficulty speaking since earlier June. Pt reports that about 10:00 AM, she had another episode of word finding difficulties that lasted for about 5-10 minutes, resolved spontaneously.    MRI 11/08/2023  1. Subtle 7 mm focus of diffusion signal abnormality involving the  posterior left frontal cortex, suspicious for an acute ischemic  infarct. No associated hemorrhage or mass effect.  2. No other acute intracranial abnormality.  3. Underlying mild to moderate chronic microvascular ischemic  disease.  4. Moderate bilateral mastoid effusions.   Assessment / Plan / Recommendation Clinical Impression  Pt and her daughter report that pt's symptoms have largely resolved. They both report some intermittent mild dysfluency. During this assessment, pt's speech was free of dysfluencies as well as no word finding difficulty observed. Of note, pt presents with hoarse vocal quality and report of reflux. Education provided on having evaluation by ENT with written information provided on local ENT office. Would recommend pt seek Outpatient ST services should  her symptoms not improve. All questions answered to their satisfaction.    SLP Assessment  SLP Recommendation/Assessment: All further Speech Language Pathology needs can be addressed in the next venue of care SLP Visit Diagnosis:  Dysarthria and anarthria (R47.1);Aphasia (R47.01)     Assistance Recommended at Discharge  None  Functional Status Assessment Patient has had a recent decline in their functional status and/or demonstrates limited ability to make significant improvements in function in a reasonable and predictable amount of time  Frequency and Duration   N/A        SLP Evaluation Cognition  Overall Cognitive Status: Within Functional Limits for tasks assessed       Comprehension  Auditory Comprehension Overall Auditory Comprehension: Appears within functional limits for tasks assessed    Expression Expression Primary Mode of Expression: Verbal Verbal Expression Overall Verbal Expression: Appears within functional limits for tasks assessed   Oral / Motor  Oral Motor/Sensory Function Overall Oral Motor/Sensory Function: Within functional limits Motor Speech Overall Motor Speech: Appears within functional limits for tasks assessed           Edna Rede B. Rubbie, M.S., CCC-SLP, Tree surgeon Certified Brain Injury Specialist [facility]  [facility] Rehabilitation Services Office [phone] Ascom [phone] Fax [phone]

## 2023-11-09 NOTE — Progress Notes (Signed)
 EP Brief Note -  Patient presented to [facility] ER with s/s of stroke including stuttering speech.  MRI brain revealed subtle 7mm focus suspicious for acute ischemic infarct.   TTE with normal LVEF, g1dd, mod TR.   All EKGs reviewed without AFib, aflutter noted Telemetry reviewed without AFib, aflutter.     Discussed ILR versus ambulatory monitor.  Patient agreeable to proceed with ILR implant, but unable to implant d/t provider schedules.   Will order 2 week zio for home enrollement, with plans to implant ILR in clinic at later date.   Patient and daughter verbalized understanding of plan.    Elby Blackwelder, NP Electrophysiology 11/09/23 3:56 PM

## 2023-11-09 NOTE — Evaluation (Signed)
 Occupational Therapy Evaluation Patient Details Name: Angela Watts MRN: [id number] DOB: [birth year] Today's Date: 11/09/2023   History of Present Illness   73 y.o. female past medical history significant for rheumatoid arthritis, hypertension, who presents to the emergency department for word finding difficulties.  Patient had an episode this morning of word finding difficulties that lasted for 5 or 10 minutes.  States that she was talking to her daughter and then unable to get any of her words out.  States that she felt like she knew what she wanted to say but could not speak.  States that this is happened intermittently in the past but today was the most recent episode.  Evaluated by her primary care physician and told to come to the emergency department for further workup.  Denies any falls or head trauma.  States that her speech is back to her normal at this time.  Does state that she was on a memory pill but it ran out 2 weeks ago, states that there were no refills.  Denies any headaches, change in vision, extremity numbness or weakness.  No prior history of TIA or CVA     Clinical Impressions Upon entering the room, pt supine in bed and agreeable to OT evaluation. Pt reports being Ind at baseline and living at home alone. No use of AD at baseline and drives. Pt performs bed mobility independently and dons B shoes and ties without assistance. Pt ambulating in hallway 300' independently and demonstrates use of stairs without assistance. Pt endorses being at functional baseline for mobility and self care needs. She is still concerns about language but that is to be addressed by SLP. No need for skilled acute OT and pt agrees. OT to complete orders at this time.       Functional Status Assessment   Patient has not had a recent decline in their functional status     Equipment Recommendations   None recommended by OT      Precautions/Restrictions   Precautions Precautions:  None     Mobility Bed Mobility Overal bed mobility: Modified Independent                  Transfers Overall transfer level: Independent Equipment used: None                      Balance Overall balance assessment: Independent                                         ADL either performed or assessed with clinical judgement   ADL Overall ADL's : Independent                                             Vision Baseline Vision/History: 1 Wears glasses Patient Visual Report: No change from baseline              Pertinent Vitals/Pain Pain Assessment Pain Assessment: No/denies pain     Extremity/Trunk Assessment Upper Extremity Assessment Upper Extremity Assessment: Overall WFL for tasks assessed;Right hand dominant   Lower Extremity Assessment Lower Extremity Assessment: Overall WFL for tasks assessed       Communication Communication Communication: No apparent difficulties   Cognition Arousal: Alert Behavior During Therapy: WFL for tasks assessed/performed  Cognition: No apparent impairments                               Following commands: Intact                  Home Living Family/patient expects to be discharged to:: Private residence Living Arrangements: Alone Available Help at Discharge: Family;Available PRN/intermittently Type of Home: House Home Access: Stairs to enter [employer] of Steps: 2-3 Entrance Stairs-Rails: None (holds door on R side) Home Layout: One level     Bathroom Shower/Tub: Tub/shower unit         Home Equipment: None          Prior Functioning/Environment Prior Level of Function : Independent/Modified Independent;Driving                            OT Goals(Current goals can be found in the care plan section)   Acute Rehab OT Goals Patient Stated Goal: to go home OT Goal Formulation: With patient/family Time For Goal  Achievement: 11/09/23 Potential to Achieve Goals: Fair   AM-PAC OT 6 Clicks Daily Activity     Outcome Measure Help from another person eating meals?: None Help from another person taking care of personal grooming?: None Help from another person toileting, which includes using toliet, bedpan, or urinal?: None Help from another person bathing (including washing, rinsing, drying)?: None Help from another person to put on and taking off regular upper body clothing?: None Help from another person to put on and taking off regular lower body clothing?: None 6 Click Score: 24   End of Session    Activity Tolerance: Patient tolerated treatment well Patient left: in bed;with family/visitor present;with call bell/phone within reach                   Time: 1028-1040 OT Time Calculation (min): 12 min Charges:  OT General Charges $OT Visit: 1 Visit OT Evaluation $OT Eval Low Complexity: 1 Low  Izetta Claude, MS, OTR/L , CBIS ascom [phone]  11/09/23, 1:14 PM

## 2023-11-09 NOTE — Discharge Summary (Signed)
 Physician Discharge Summary   Patient: Angela Watts MRN: [id number] DOB: 17-Feb-1951  Admit date:     11/08/2023  Discharge date: 11/09/23  Discharge Physician: Murvin Mana   PCP: Valerio Melanie DASEN, NP   Recommendations at discharge:   Follow-up with PCP in 1 week. Follow-up with cardiology after ZIO recorder. Follow-up with neurology in 1 month.  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 *  Hospital Course:  Angela Watts is a 73 y.o. female with medical history significant of hypertension, hyperlipidemia, GERD, memory change, psoriasis, rheumatoid arthritis on methotrexate, who presents with difficulty speaking.  Symptoms since has resolved after arriving the hospital. Stroke workup showed LDL of 85,CT angiogram of the neck and head did not show any significant occlusion.  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 Patient has been seen by neurology, recommended 21 days of Plavix and continued aspirin.  Also continue statin. ZIO recorder will be mailed to the patient by cardiology, followed by a loop recorder in the office.   Assessment and Plan: Acute ischemic stroke of the left frontal lobe. Patient symptom has resolved, no need for PT/OT. Discussed with neurology, will continue aspirin, 21 days of Plavix.  Crestor  increased to 20 mg daily. Discussed with cardiology, zip recorder was sent to the patient.  Then followed by a loop recorder in the office.  Essential hypertension. Resume home medicines  Rheumatoid arthritis Continue medicines.  Class III obesity with BMI 66.  Diet and exercise.      Consultants: Neurology. Procedures  performed: None  Disposition: Home Diet recommendation:  Discharge Diet Orders (From admission, onward)     Start     Ordered   11/09/23 0000  Diet - low sodium heart healthy        11/09/23 1604           Cardiac diet DISCHARGE MEDICATION: Allergies as of 11/09/2023       Reactions   Prednisone Itching        Medication List     TAKE these medications    amLODipine  5 MG tablet Commonly known as: NORVASC  Take 1 tablet (5 mg total) by mouth daily.   aspirin EC 81 MG tablet Take 1 tablet (81 mg total) by mouth daily. Swallow whole.   clopidogrel 75 MG tablet Commonly known as: PLAVIX Take 1 tablet (75 mg total) by mouth daily for 21 days. Start taking on: November 10, 2023   cyanocobalamin  1000 MCG tablet Commonly known as: VITAMIN B12 Take 1,000 mcg by mouth daily.   donepezil  5 MG tablet Commonly known as: ARICEPT  Take 1 tablet (5 mg total) by mouth at bedtime.   famotidine 20 MG tablet Commonly known as: PEPCID Take 20 mg by mouth daily as needed for heartburn or indigestion.   folic acid  1 MG tablet Commonly known as: FOLVITE  Take 1 tablet by mouth daily.   methotrexate 2.5 MG tablet Commonly known as: RHEUMATREX Take 7 tabs (17.5 MG ) once a week for 12 weeks   multivitamin capsule Take 1 capsule by mouth daily.   rosuvastatin  20 MG tablet Commonly known as: CRESTOR  Take 1 tablet (20 mg total) by mouth daily.  TAKE 1 TABLET THREE TIMES WEEKLY ON MONDAY, WEDNESDAY, AND FRIDAY AT 6 PM What changed: See the new instructions.        Follow-up Information     Valerio Moris T, NP Follow up in 1 week(s).   Specialty: Nurse Practitioner Why: Hospital follow up Contact information: [address] [place] [address] [phone]         [facility] Follow up in 1 month(s).   Contact information: [address]  [place]  [address] [phone]               Discharge Exam: Angela Watts  Weights   11/08/23 1739 11/08/23 2045  Weight: 83.9 kg (!) 188.1 kg   General exam: Appears calm and comfortable  Respiratory system: Clear to auscultation. Respiratory effort normal. Cardiovascular system: S1 & S2 heard, RRR. No JVD, murmurs, rubs, gallops or clicks. No pedal edema. Gastrointestinal system: Abdomen is nondistended, soft and nontender. No organomegaly or masses felt. Normal bowel sounds heard. Central nervous system: Alert and oriented. No focal neurological deficits. Extremities: Symmetric 5 x 5 power. Skin: No rashes, lesions or ulcers Psychiatry: Judgement and insight appear normal. Mood & affect appropriate.    Condition at discharge: good  The results of significant diagnostics from this hospitalization (including imaging, microbiology, ancillary and laboratory) are listed below for reference.   Imaging Studies: ECHOCARDIOGRAM COMPLETE Result Date: 11/09/2023    ECHOCARDIOGRAM REPORT   Patient Name:   SIGOURNEY PORTILLO Date of Exam: 11/09/2023 Medical Rec #:  [id number]         Height:       66.0 in Accession #:    [id number]        Weight:       194.2 lb Date of Birth:  1950-09-03         BSA:          1.975 m Patient Age:    73 years          BP:           123/68 mmHg Patient Gender: F                 HR:           66 bpm. Exam Location:  [facility] Procedure: 2D Echo, Cardiac Doppler and Color Doppler (Both Spectral and Color            Flow Doppler were utilized during procedure). Indications:     TIA  History:         Patient has no prior history of Echocardiogram examinations.                  Risk Factors:Hypertension and Dyslipidemia.  Sonographer:     Meagan Baucom RDCS, FE, PE Referring Phys:  [id number] XILIN NIU Diagnosing Phys: Evalene Lunger MD IMPRESSIONS  1. Left ventricular ejection fraction, by estimation, is 55 to 60%. Left ventricular ejection fraction by PLAX is 66 %. The left ventricle has normal function. The left ventricle has no regional wall motion abnormalities.  Left ventricular diastolic parameters are consistent with Grade I diastolic dysfunction (impaired relaxation).  2. Right ventricular systolic function is normal. The right ventricular size is normal. There is normal pulmonary artery systolic pressure. The estimated right ventricular systolic pressure is 33.7 mmHg.  3. The mitral valve is normal in structure. No evidence of mitral valve regurgitation. No evidence of mitral stenosis.  4. Tricuspid valve regurgitation is moderate.  5. The aortic valve is normal in structure. Aortic valve regurgitation is not visualized. Aortic valve sclerosis is present, with no evidence of aortic valve stenosis.  6. The inferior vena cava is normal in size with greater than 50% respiratory variability, suggesting right atrial pressure of 3 mmHg. FINDINGS  Left Ventricle: Left ventricular ejection fraction, by estimation, is 55 to 60%. Left ventricular ejection fraction by PLAX is 66 %. The left ventricle has normal function. The left ventricle has no regional wall motion abnormalities. Strain was performed and the global longitudinal strain is indeterminate. The left ventricular internal cavity size was normal in size. There is no left ventricular hypertrophy. Left ventricular diastolic parameters are consistent with Grade I diastolic dysfunction  (impaired relaxation). Right Ventricle: The right ventricular size is normal. No increase in right ventricular wall thickness. Right ventricular systolic function is normal. There is normal pulmonary artery systolic pressure. The tricuspid regurgitant velocity is 2.68 m/s, and  with an assumed right atrial pressure of 5 mmHg, the estimated right ventricular systolic pressure is 33.7 mmHg. Left Atrium: Left atrial size was normal in size. Right Atrium: Right atrial size was normal in size. Pericardium: There is no evidence of pericardial effusion. Mitral Valve: The mitral valve is normal in structure. No evidence of mitral valve  regurgitation. No evidence of mitral valve stenosis. Tricuspid Valve: The tricuspid valve is normal in structure. Tricuspid valve regurgitation is moderate . No evidence of tricuspid stenosis. Aortic Valve: The aortic valve is normal in structure. Aortic valve regurgitation is not visualized. Aortic valve sclerosis is present, with no evidence of aortic valve stenosis. Aortic valve mean gradient measures 4.0 mmHg. Aortic valve peak gradient measures 6.5 mmHg. Aortic valve area, by VTI measures 3.83 cm. Pulmonic Valve: The pulmonic valve was normal in structure. Pulmonic valve regurgitation is not visualized. No evidence of pulmonic stenosis. Aorta: The aortic root is normal in size and structure. Venous: The inferior vena cava is normal in size with greater than 50% respiratory variability, suggesting right atrial pressure of 3 mmHg. IAS/Shunts: No atrial level shunt detected by color flow Doppler. Additional Comments: 3D was performed not requiring image post processing on an independent workstation and was indeterminate.  LEFT VENTRICLE PLAX 2D LV EF:         Left            Diastology                ventricular     LV e' medial:    10.70 cm/s                ejection        LV E/e' medial:  7.7                fraction by     LV e' lateral:   12.10 cm/s                PLAX is 66      LV E/e' lateral: 6.8                %. LVIDd:         3.90 cm LVIDs:         2.50 cm LV PW:         0.80 cm LV IVS:        0.80 cm LVOT diam:     2.30 cm LV SV:         103 LV SV Index:  52 LVOT Area:     4.15 cm  RIGHT VENTRICLE RV Basal diam:  2.90 cm RV Mid diam:    2.60 cm RV S prime:     17.00 cm/s TAPSE (M-mode): 2.4 cm LEFT ATRIUM             Index        RIGHT ATRIUM           Index LA diam:        3.30 cm 1.67 cm/m   RA Area:     13.00 cm LA Vol (A2C):   40.7 ml 20.61 ml/m  RA Volume:   29.00 ml  14.68 ml/m LA Vol (A4C):   34.7 ml 17.57 ml/m LA Biplane Vol: 40.7 ml 20.61 ml/m  AORTIC VALVE AV Area (Vmax):    3.96 cm  AV Area (Vmean):   3.88 cm AV Area (VTI):     3.83 cm AV Vmax:           127.00 cm/s AV Vmean:          88.200 cm/s AV VTI:            0.269 m AV Peak Grad:      6.5 mmHg AV Mean Grad:      4.0 mmHg LVOT Vmax:         121.00 cm/s LVOT Vmean:        82.300 cm/s LVOT VTI:          0.248 m LVOT/AV VTI ratio: 0.92  AORTA Ao Root diam: 2.70 cm MITRAL VALVE                TRICUSPID VALVE MV Area (PHT): 2.73 cm     TV Peak grad:   31.6 mmHg MV Decel Time: 278 msec     TV Vmax:        2.81 m/s MV E velocity: 82.80 cm/s   TR Peak grad:   28.7 mmHg MV A velocity: 116.00 cm/s  TR Vmax:        268.00 cm/s MV E/A ratio:  0.71                             SHUNTS                             Systemic VTI:  0.25 m                             Systemic Diam: 2.30 cm Evalene Lunger MD Electronically signed by Evalene Lunger MD Signature Date/Time: 11/09/2023/12:50:15 PM    Final    MR BRAIN WO CONTRAST Result Date: 11/09/2023 CLINICAL DATA:  Initial evaluation for acute TIA. EXAM: MRI HEAD WITHOUT CONTRAST TECHNIQUE: Multiplanar, multiecho pulse sequences of the brain and surrounding structures were obtained without intravenous contrast. COMPARISON:  Prior CTs from earlier the same day as well as prior brain MRI from 07/12/2023 FINDINGS: Brain: Cerebral volume within normal limits. Scattered patchy T2/FLAIR hyperintensity involving the periventricular, deep, and subcortical white matter of both cerebral hemispheres, most like related to chronic microvascular ischemic disease, mild to moderate in nature. Probable small remote lacunar infarct at the right globus pallidus. Subtle 7 mm focus of diffusion signal abnormality seen involving the posterior left frontal cortex (series 5, image 37), suspicious for a tiny acute ischemic infarct. No  associated hemorrhage or mass effect. No other evidence for acute or subacute ischemia. Gray-white matter differentiation otherwise maintained. No acute or chronic intracranial blood products. No  mass lesion, midline shift or mass effect. No hydrocephalus or extra-axial fluid collection. Pituitary gland within normal limits. Vascular: Major intracranial vascular flow voids are maintained. Skull and upper cervical spine: Craniocervical junction within normal limits. Bone marrow signal intensity overall within normal limits. No scalp soft tissue abnormality. Sinuses/Orbits: Globes orbital soft tissues within normal limits. Paranasal sinuses are largely clear. Moderate bilateral mastoid effusions noted. Image nasopharynx unremarkable. Other: None. IMPRESSION: 1. Subtle 7 mm focus of diffusion signal abnormality involving the posterior left frontal cortex, suspicious for an acute ischemic infarct. No associated hemorrhage or mass effect. 2. No other acute intracranial abnormality. 3. Underlying mild to moderate chronic microvascular ischemic disease. 4. Moderate bilateral mastoid effusions. Electronically Signed   By: Morene Hoard M.D.   On: 11/09/2023 00:10   CT ANGIO HEAD NECK W WO CM Result Date: 11/08/2023 CLINICAL DATA:  Initial evaluation for acute TIA. EXAM: CT ANGIOGRAPHY HEAD AND NECK WITH AND WITHOUT CONTRAST TECHNIQUE: Multidetector CT imaging of the head and neck was performed using the standard protocol during bolus administration of intravenous contrast. Multiplanar CT image reconstructions and MIPs were obtained to evaluate the vascular anatomy. Carotid stenosis measurements (when applicable) are obtained utilizing NASCET criteria, using the distal internal carotid diameter as the denominator. RADIATION DOSE REDUCTION: This exam was performed according to the departmental dose-optimization program which includes automated exposure control, adjustment of the mA and/or kV according to patient size and/or use of iterative reconstruction technique. CONTRAST:  75mL OMNIPAQUE  IOHEXOL  350 MG/ML SOLN COMPARISON:  CT from earlier the same day. FINDINGS: CTA NECK FINDINGS Aortic arch: Standard  branching. Imaged portion shows no evidence of aneurysm or dissection. No significant stenosis of the major arch vessel origins. Mild aortic atherosclerosis. Right carotid system: No evidence of dissection, stenosis (50% or greater), or occlusion. Left carotid system: No evidence of dissection, stenosis (50% or greater), or occlusion. Vertebral arteries: No evidence of dissection, stenosis (50% or greater), or occlusion. Skeleton: No worrisome osseous lesions. Moderately advanced spondylosis throughout the visualized cervicothoracic spine. Advanced osteoarthritic changes noted about the right sternoclavicular articulation. Other neck: No other acute finding. Upper chest: No other acute finding. Review of the MIP images confirms the above findings CTA HEAD FINDINGS Anterior circulation: Both internal carotid arteries are patent to the termini without significant stenosis. A1 segments, anterior communicating artery complex common anterior cerebral arteries patent without stenosis. No M1 stenosis or occlusion. Distal MCA branches perfused and symmetric. Posterior circulation: Both V4 segments patent without stenosis. Left vertebral artery dominant. Right PICA patent. Left PICA origin not well seen. Basilar patent without stenosis. Superior cerebellar and posterior cerebral arteries patent bilaterally. Venous sinuses: Grossly patent allowing for timing the contrast bolus. Anatomic variants: None significant.  No aneurysm. Review of the MIP images confirms the above findings IMPRESSION: 1. Negative CTA of the head and neck. No large vessel occlusion or other emergent finding. No hemodynamically significant or correctable stenosis. 2.  Aortic Atherosclerosis (ICD10-I70.0). Electronically Signed   By: Morene Hoard M.D.   On: 11/08/2023 20:43   CT HEAD WO CONTRAST Result Date: 11/08/2023 CLINICAL DATA:  Transient ischemic attack (TIA). Difficulty finding words. EXAM: CT HEAD WITHOUT CONTRAST TECHNIQUE: Contiguous  axial images were obtained from the base of the skull through the vertex without intravenous contrast. RADIATION DOSE REDUCTION: This exam was performed according to the  departmental dose-optimization program which includes automated exposure control, adjustment of the mA and/or kV according to patient size and/or use of iterative reconstruction technique. COMPARISON:  None Available. FINDINGS: Brain: No acute intracranial abnormality. Specifically, no hemorrhage, hydrocephalus, mass lesion, acute infarction, or significant intracranial injury. Low-density throughout the deep white matter compatible with chronic small vessel disease. Vascular: No hyperdense vessel or unexpected calcification. Skull: No acute calvarial abnormality. 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 No acute intracranial abnormality. Electronically Signed   By: Franky Crease M.D.   On: 11/08/2023 18:07    Microbiology: Results for orders placed or performed in visit on 12/30/18  Novel Coronavirus, NAA ([facility])     Status: None   Collection Time: 12/30/18 12:00 AM   Specimen: Oropharyngeal(OP) collection in vial transport medium   OROPHARYNGEA  SCREENIN  Result Value Ref Range Status   SARS-CoV-2, NAA Not Detected Not Detected Final    Comment: This test was developed and its performance characteristics determined by [employer]. This test has not been FDA cleared or approved. This test has been authorized by FDA under an Emergency Use Authorization (EUA). This test is only authorized for the duration of time the declaration that circumstances exist justifying the authorization of the emergency use of in vitro diagnostic tests for detection of SARS-CoV-2 virus and/or diagnosis of COVID-19 infection under section 564(b)(1) of the Act, 21 U.S.C. 639aaa-6(a)(8), unless the authorization is terminated or revoked sooner. When diagnostic  testing is negative, the possibility of a false negative result should be considered in the context of a patient's recent exposures and the presence of clinical signs and symptoms consistent with COVID-19. An individual without symptoms of COVID-19 and who is not shedding SARS-CoV-2 virus would expect to have a negative (not detected) result in this assay.     Labs: CBC: Recent Labs  Lab 11/08/23 1743  WBC 5.3  NEUTROABS 2.9  HGB 12.9  HCT 38.1  MCV 90.7  PLT 284   Basic Metabolic Panel: Recent Labs  Lab 11/08/23 1321 11/08/23 1743  NA 143 135  K 4.3 3.8  CL 105 103  CO2 22 23  GLUCOSE 93 92  BUN 12 15  CREATININE 0.79 0.62  CALCIUM  9.5 8.8*   Liver Function Tests: Recent Labs  Lab 11/08/23 1321 11/08/23 1743  AST 19 21  ALT 16 18  ALKPHOS 159* 110  BILITOT 0.4 0.6  PROT 7.0 7.3  ALBUMIN 4.1 3.7   CBG: No results for input(s): GLUCAP in the last 168 hours.  Discharge time spent: 35 minutes  Signed: Murvin Mana, MD [facility] 11/09/2023

## 2023-11-09 NOTE — Progress Notes (Signed)
 Patient ZIO is in progress, pending shipment.   Order was placed.

## 2023-11-09 NOTE — Care Management Obs Status (Signed)
 MEDICARE OBSERVATION STATUS NOTIFICATION   Patient Details  Name: Angela Watts MRN: [id number] Date of Birth: 1951/02/24   Medicare Observation Status Notification Given:  Yes    Kamon Fahr W, CMA 11/09/2023, 1:52 PM

## 2023-11-09 NOTE — Consult Note (Addendum)
 NEUROLOGY CONSULT NOTE   Date of service: November 09, 2023 Patient Name: Angela Watts MRN:  [id number] DOB:  Apr 24, 1951 Chief Complaint: Stuttering speech Requesting Provider: Laurita Pillion, MD  History of Present Illness  Angela Watts is a 73 y.o. female with a PMHx of HLD, HTN, RA and psoriasis who presented to the ED yesterday evening after an episode of stuttering speech at home. The symptoms were present on awakening that morning. LKN was before going to sleep on Monday night. The patient states that when speaking with her daughter over the telephone after waking up, she knew what she wanted to say, but just could not get the words out. The spell lasted for about 10 minutes. She contacted her PCP, who advised her to go to the ED for evaluation.   MRI brain revealed a subtle 7 mm focus of diffusion signal abnormality involving the posterior left frontal cortex, suspicious for an acute ischemic infarct.     ROS  Comprehensive ROS performed and pertinent positives documented in HPI   Past History   Past Medical History:  Diagnosis Date   Abnormal Pap smear of cervix    Abscess of bursa, left hip    Arthritis    Colon adenomas    GERD (gastroesophageal reflux disease)    Hyperlipidemia    Hypertension    Psoriasis    Rheumatoid arthritis flare (HCC)    TIA (transient ischemic attack) 10/2023    Past Surgical History:  Procedure Laterality Date   ABDOMINAL HYSTERECTOMY  2003   BREAST BIOPSY Left 06/01/2016   us  bx done by dr byrnett 6:00 2cmfn intraducal papilloma with calcs   BREAST BIOPSY Left 02/05/2019   Affirm bx-X clip-benign parenchyma with fragments of sclerosed intraductal papilloma   BREAST BIOPSY Left 2012   us  bx done by Dr. Dessa, intraductal papilloma   BREAST CYST ASPIRATION Left 2011   NEG/ [facility] Byrnett   BREAST CYST ASPIRATION Left 06/01/2016   dr byrnett 5:00 3cmfn aspirated with complete resolution   COLONOSCOPY WITH PROPOFOL  N/A 06/11/2015    Procedure: COLONOSCOPY WITH PROPOFOL ;  Surgeon: Lamar ONEIDA Holmes, MD;  Location: [facility] ENDOSCOPY;  Service: Endoscopy;  Laterality: N/A;   COLONOSCOPY WITH PROPOFOL  N/A 07/14/2018   Procedure: COLONOSCOPY WITH PROPOFOL ;  Surgeon: Holmes Lamar ONEIDA, MD;  Location: [facility] ENDOSCOPY;  Service: Endoscopy;  Laterality: N/A;   CYST EXCISION Left    breast    Family History: Family History  Problem Relation Age of Onset   Hypertension Mother    Arthritis Mother    Breast cancer Mother        32   Cancer Mother        breast   Hypertension Father    Lung cancer Father    Diabetes Sister    Hypertension Sister    Hypertension Sister    Hypertension Sister    Hypertension Sister    Hypertension Brother    Hypertension Brother    Cancer Brother        lung    Social History  reports that she has never smoked. She has never used smokeless tobacco. She reports current alcohol use. She reports that she does not use drugs.  Allergies  Allergen Reactions   Prednisone Itching    Medications   Current Facility-Administered Medications:     stroke: early stages of recovery book, , Does not apply, Once, Niu, Xilin, MD   acetaminophen  (TYLENOL ) tablet 650 mg, 650 mg, Oral, Q4H PRN **  OR** acetaminophen  (TYLENOL ) 160 MG/5ML solution 650 mg, 650 mg, Per Tube, Q4H PRN **OR** acetaminophen  (TYLENOL ) suppository 650 mg, 650 mg, Rectal, Q4H PRN, Niu, Xilin, MD   aspirin chewable tablet 324 mg, 324 mg, Oral, Daily, Niu, Xilin, MD, 324 mg at 11/09/23 9090   clopidogrel (PLAVIX) tablet 75 mg, 75 mg, Oral, Daily, Zhang, Dekui, MD, 75 mg at 11/09/23 0909   cyanocobalamin  (VITAMIN B12) tablet 1,000 mcg, 1,000 mcg, Oral, Daily, Niu, Xilin, MD, 1,000 mcg at 11/09/23 9090   donepezil  (ARICEPT ) tablet 5 mg, 5 mg, Oral, QHS, Niu, Xilin, MD, 5 mg at 11/09/23 0144   enoxaparin (LOVENOX) injection 40 mg, 40 mg, Subcutaneous, Q24H, Niu, Xilin, MD, 40 mg at 11/08/23 2210   famotidine (PEPCID) tablet 20 mg, 20 mg,  Oral, Daily PRN, Niu, Xilin, MD   folic acid  (FOLVITE ) tablet 1 mg, 1 mg, Oral, Daily, Niu, Xilin, MD, 1 mg at 11/09/23 9090   hydrALAZINE (APRESOLINE) injection 5 mg, 5 mg, Intravenous, Q2H PRN, Niu, Xilin, MD   NOREEN ON 11/15/2023] methotrexate (RHEUMATREX) tablet 17.5 mg, 17.5 mg, Oral, Weekly, Belue, Nathan S, RPH   multivitamin with minerals tablet 1 tablet, 1 tablet, Oral, Daily, Niu, Xilin, MD, 1 tablet at 11/09/23 0909   ondansetron (ZOFRAN) injection 4 mg, 4 mg, Intravenous, Q8H PRN, Niu, Xilin, MD   rosuvastatin  (CRESTOR ) tablet 20 mg, 20 mg, Oral, Daily, Zhang, Dekui, MD, 20 mg at 11/09/23 9090   senna-docusate (Senokot-S) tablet 1 tablet, 1 tablet, Oral, QHS PRN, Niu, Xilin, MD  No current facility-administered medications on file prior to encounter.   Current Outpatient Medications on File Prior to Encounter  Medication Sig Dispense Refill   amLODipine  (NORVASC ) 5 MG tablet Take 1 tablet (5 mg total) by mouth daily. 90 tablet 4   cyanocobalamin  (VITAMIN B12) 1000 MCG tablet Take 1,000 mcg by mouth daily.     donepezil  (ARICEPT ) 5 MG tablet Take 1 tablet (5 mg total) by mouth at bedtime. 45 tablet 2   famotidine (PEPCID) 20 MG tablet Take 20 mg by mouth daily as needed for heartburn or indigestion.     folic acid  (FOLVITE ) 1 MG tablet Take 1 tablet by mouth daily.     methotrexate (RHEUMATREX) 2.5 MG tablet Take 7 tabs (17.5 MG ) once a week for 12 weeks     Multiple Vitamin (MULTIVITAMIN) capsule Take 1 capsule by mouth daily.     rosuvastatin  (CRESTOR ) 20 MG tablet TAKE 1 TABLET THREE TIMES WEEKLY ON MONDAY, WEDNESDAY, AND FRIDAY AT 6 PM 39 tablet 3     Vitals   Vitals:   11/08/23 2045 11/09/23 0011 11/09/23 0443 11/09/23 0815  BP: (!) 147/52 128/60 128/61 137/71  Pulse: 73 (!) 57 64 (!) 58  Resp: 16 16 16 18   Temp: 97.9 F (36.6 C) 97.6 F (36.4 C) 97.7 F (36.5 C) 97.7 F (36.5 C)  TempSrc: Oral     SpO2: 98% 97% 96% 98%  Weight: (!) 188.1 kg     Height: 5' 6  (1.676 m)       Body mass index is 66.93 kg/m.   Physical Exam   Constitutional: Appears well-developed and well-nourished.  Psych: Affect appropriate to situation.  Eyes: No scleral injection.  HENT: No OP obstruction.  Head: Normocephalic.  Respiratory: Effort normal, non-labored breathing.    Neurologic Examination   Mental Status: Awake and alert. Fully oriented x 5. Thought content appropriate. Speech fluent with intact naming and comprehension. No dysarthria. Able to  follow all commands without difficulty. Cranial Nerves: II: Temporal visual fields intact with no extinction to DSS. PERRL. III,IV, VI: No ptosis. EOMI. No nystagmus. V: Temp sensation mildly decreased on the left VII: Smile symmetric VIII: Hearing intact to voice IX,X: No hypophonia or hoarseness XI: Symmetric XII: Midline tongue extension Motor: RUE: 5/5 LUE: 5/5 RLE: 5/5 LLE: 5/5 No pronator drift Sensory: Temp sensation mildly decreased to RUE and RLE. No extinction to DSS. Deep Tendon Reflexes: 2+ and symmetric bilateral biceps and brachioradialis. Trace patellar reflexes bilaterally.  Cerebellar: No ataxia with FNF bilaterally Gait: Deferred  Labs/Imaging/Neurodiagnostic studies   CBC:  Recent Labs  Lab 20-Nov-2023 1743  WBC 5.3  NEUTROABS 2.9  HGB 12.9  HCT 38.1  MCV 90.7  PLT 284   Basic Metabolic Panel:  Lab Results  Component Value Date   NA 135 Nov 20, 2023   K 3.8 11/20/2023   CO2 23 11/20/2023   GLUCOSE 92 Nov 20, 2023   BUN 15 11/20/23   CREATININE 0.62 11/20/23   CALCIUM  8.8 (L) 20-Nov-2023   GFRNONAA >60 11/20/2023   GFRAA 81 05/06/2020   Lipid Panel:  Lab Results  Component Value Date   LDLCALC 85 11/09/2023   HgbA1c:  Lab Results  Component Value Date   HGBA1C 5.3 20-Nov-2023   Urine Drug Screen: No results found for: LABOPIA, COCAINSCRNUR, LABBENZ, AMPHETMU, THCU, LABBARB  Alcohol Level     Component Value Date/Time   [facility] <15 2023-11-20 1743    INR  Lab Results  Component Value Date   INR 1.1 11/20/2023   APTT  Lab Results  Component Value Date   APTT 31 11-20-2023   TTE: 1. Left ventricular ejection fraction, by estimation, is 55 to 60%. Left  ventricular ejection fraction by PLAX is 66 %. The left ventricle has  normal function. The left ventricle has no regional wall motion  abnormalities. Left ventricular diastolic  parameters are consistent with Grade I diastolic dysfunction (impaired  relaxation).   2. Right ventricular systolic function is normal. The right ventricular  size is normal. There is normal pulmonary artery systolic pressure. The  estimated right ventricular systolic pressure is 33.7 mmHg.   3. The mitral valve is normal in structure. No evidence of mitral valve  regurgitation. No evidence of mitral stenosis.   4. Tricuspid valve regurgitation is moderate.   5. The aortic valve is normal in structure. Aortic valve regurgitation is  not visualized. Aortic valve sclerosis is present, with no evidence of  aortic valve stenosis.   6. The inferior vena cava is normal in size with greater than 50%  respiratory variability, suggesting right atrial pressure of 3 mmHg.   ASSESSMENT  Angela Watts is a 73 y.o. female with a PMHx of HLD, HTN, RA and psoriasis who presented to the ED yesterday evening after an episode of stuttering speech at home. The symptoms were present on awakening that morning. LKN was before going to sleep on Monday night. The patient states that when speaking with her daughter over the telephone after waking up, she knew what she wanted to say, but just could not get the words out. The spell lasted for about 10 minutes. She contacted her PCP, who advised her to go to the ED for evaluation.  - Exam reveals mildly decreased left facial sensation and mildly decreased sensation to RUE and RLE. No motor weakness, aphasia, dysarthria or ataxia noted.  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 No  acute intracranial abnormality. - CTA of head and neck: Negative CTA of the head and neck. No large vessel occlusion or other emergent finding. No hemodynamically significant or correctable stenosis. Aortic atherosclerosis - MRI brain: Subtle 7 mm focus of diffusion signal abnormality involving the posterior left frontal cortex, suspicious for an acute ischemic infarct. No associated hemorrhage or mass effect. No other acute intracranial abnormality. Underlying mild to moderate chronic microvascular ischemic disease. Moderate bilateral mastoid effusions. - EKG: NSR.  - HgbA1c is normal. Lipid panel normal.  - TTE without findings to suggest a cardioembolic source for her subtle left frontal lobe stroke (no valvular vegetation, normal EF, no hypokinetic segments, no thrombus visualized).   RECOMMENDATIONS  - Agree with starting ASA and Plavix. Continue DAPT for 21 days, then discontinue Plavix and remain on ASA indefinitely thereafter.  - Continue her home statin - Cardiac telemetry - BP management per standard protocol. Now out of the permissive HTN time window.  - PT consult, OT consult, Speech consult - Frequent neuro checks - Zio patch versus loop recorder - Outpatient Neurology follow up.  ______________________________________________________________________    Bonney SHARK, Lester Crickenberger, MD [facility]

## 2023-11-12 DIAGNOSIS — I639 Cerebral infarction, unspecified: Secondary | ICD-10-CM | POA: Diagnosis not present

## 2023-11-14 ENCOUNTER — Telehealth: Payer: Self-pay

## 2023-11-14 DIAGNOSIS — Z860101 Personal history of adenomatous and serrated colon polyps: Secondary | ICD-10-CM | POA: Diagnosis not present

## 2023-11-14 DIAGNOSIS — I639 Cerebral infarction, unspecified: Secondary | ICD-10-CM | POA: Diagnosis not present

## 2023-11-14 NOTE — Telephone Encounter (Signed)
 Copied from CRM [id number]. Topic: Appointments - Appointment Scheduling >> Nov 14, 2023  3:32 PM Charlet HERO wrote: Patient/patient representative is calling to schedule an appointment. Refer to attachments for appointment information.  Patient is stating that she will need earlier appt per Dr in hospital she had a stroke.

## 2023-11-14 NOTE — Telephone Encounter (Signed)
 Routing to provider to advise. Currently no open appointments before the one scheduled on 11/23/23. Can patient wait until that appointment or does she need to be worked in before then?

## 2023-11-15 DIAGNOSIS — R4789 Other speech disturbances: Secondary | ICD-10-CM | POA: Diagnosis not present

## 2023-11-15 DIAGNOSIS — R0683 Snoring: Secondary | ICD-10-CM | POA: Diagnosis not present

## 2023-11-15 DIAGNOSIS — Z8673 Personal history of transient ischemic attack (TIA), and cerebral infarction without residual deficits: Secondary | ICD-10-CM | POA: Diagnosis not present

## 2023-11-15 DIAGNOSIS — R413 Other amnesia: Secondary | ICD-10-CM | POA: Diagnosis not present

## 2023-11-15 NOTE — Telephone Encounter (Signed)
Called and LVM notifying patient of Jolene's message.

## 2023-11-16 DIAGNOSIS — M170 Bilateral primary osteoarthritis of knee: Secondary | ICD-10-CM | POA: Diagnosis not present

## 2023-11-19 NOTE — Patient Instructions (Incomplete)
 You are to see Angela Watts located at [facility] on July 22nd at 4 pm -- Dr. Kennyth  Stroke Prevention Some medical conditions and lifestyle choices can lead to a higher risk for a stroke. You can help to prevent a stroke by eating healthy foods and exercising. It also helps to not smoke and to manage any health problems you may have. How can this condition affect me? A stroke is an emergency. It should be treated right away. A stroke can lead to brain damage or threaten your life. There is a better chance of surviving and getting better after a stroke if you get medical help right away. What can increase my risk? The following medical conditions may increase your risk of a stroke: Diseases of the heart and blood vessels (cardiovascular disease). High blood pressure (hypertension). Diabetes. High cholesterol. Sickle cell disease. Problems with blood clotting. Being very overweight. Sleeping problems (obstructivesleep apnea). Other risk factors include: Being older than age 6. A history of blood clots, stroke, or mini-stroke (TIA). Race, ethnic background, or a family history of stroke. Smoking or using tobacco products. Taking birth control pills, especially if you smoke. Heavy alcohol and drug use. Not being active. What actions can I take to prevent this? Manage your health conditions High cholesterol. Eat a healthy diet. If this is not enough to manage your cholesterol, you may need to take medicines. Take medicines as told by your doctor. High blood pressure. Try to keep your blood pressure below 130/80. If your blood pressure cannot be managed through a healthy diet and regular exercise, you may need to take medicines. Take medicines as told by your doctor. Ask your doctor if you should check your blood pressure at home. Have your blood pressure checked every year. Diabetes. Eat a healthy diet and get regular exercise. If your blood sugar (glucose) cannot be managed through  diet and exercise, you may need to take medicines. Take medicines as told by your doctor. Talk to your doctor about getting checked for sleeping problems. Signs of a problem can include: Snoring a lot. Feeling very tired. Make sure that you manage any other conditions you have. Nutrition  Follow instructions from your doctor about what to eat or drink. You may be told to: Eat and drink fewer calories each day. Limit how much salt (sodium) you use to 1,500 milligrams (mg) each day. Use only healthy fats for cooking, such as olive oil, canola oil, and sunflower oil. Eat healthy foods. To do this: Choose foods that are high in fiber. These include whole grains, and fresh fruits and vegetables. Eat at least 5 servings of fruits and vegetables a day. Try to fill one-half of your plate with fruits and vegetables at each meal. Choose low-fat (lean) proteins. These include low-fat cuts of meat, chicken without skin, fish, tofu, beans, and nuts. Eat low-fat dairy products. Avoid foods that: Are high in salt. Have saturated fat. Have trans fat. Have cholesterol. Are processed or pre-made. Count how many carbohydrates you eat and drink each day. Lifestyle If you drink alcohol: Limit how much you have to: 0-1 drink a day for women who are not pregnant. 0-2 drinks a day for men. Know how much alcohol is in your drink. In the [place] one drink equals one 12 oz bottle of beer ( ), one 5 oz glass of wine ( ), or one 1 oz glass of hard liquor (44mL). Do not smoke or use any products that have nicotine or tobacco. If you need  help quitting, ask your doctor. Avoid secondhand smoke. Do not use drugs. Activity  Try to stay at a healthy weight. Get at least 30 minutes of exercise on most days, such as: Fast walking. Biking. Swimming. Medicines Take over-the-counter and prescription medicines only as told by your doctor. Avoid taking birth control pills. Talk to your doctor about the risks  of taking birth control pills if: You are over 32 years old. You smoke. You get very bad headaches. You have had a blood clot. Where to find more information American Stroke Association: www.strokeassociation.org Get help right away if: You or a loved one has any signs of a stroke. BE FAST is an easy way to remember the warning signs: B - Balance. Dizziness, sudden trouble walking, or loss of balance. E - Eyes. Trouble seeing or a change in how you see. F - Face. Sudden weakness or loss of feeling of the face. The face or eyelid may droop on one side. A - Arms. Weakness or loss of feeling in an arm. This happens all of a sudden and most often on one side of the body. S - Speech. Sudden trouble speaking, slurred speech, or trouble understanding what people say. T - Time. Time to call emergency services. Write down what time symptoms started. You or a loved one has other signs of a stroke, such as: A sudden, very bad headache with no known cause. Feeling like you may vomit (nausea). Vomiting. A seizure. These symptoms may be an emergency. Get help right away. Call your local emergency services (911 in the [place] Do not wait to see if the symptoms will go away. Do not drive yourself to the hospital. Summary You can help to prevent a stroke by eating healthy, exercising, and not smoking. It also helps to manage any health problems you have. Do not smoke or use any products that contain nicotine or tobacco. Get help right away if you or a loved one has any signs of a stroke. This information is not intended to replace advice given to you by your health care provider. Make sure you discuss any questions you have with your health care provider. Document Revised: 04/05/2022 Document Reviewed: 04/05/2022 Elsevier Patient Education  2024 [employer].

## 2023-11-23 ENCOUNTER — Ambulatory Visit (INDEPENDENT_AMBULATORY_CARE_PROVIDER_SITE_OTHER): Admitting: Nurse Practitioner

## 2023-11-23 ENCOUNTER — Encounter: Payer: Self-pay | Admitting: Nurse Practitioner

## 2023-11-23 VITALS — BP 132/68 | HR 76 | Temp 97.7°F | Ht 67.0 in | Wt 184.0 lb

## 2023-11-23 DIAGNOSIS — G459 Transient cerebral ischemic attack, unspecified: Secondary | ICD-10-CM

## 2023-11-23 DIAGNOSIS — R413 Other amnesia: Secondary | ICD-10-CM

## 2023-11-23 DIAGNOSIS — Z8673 Personal history of transient ischemic attack (TIA), and cerebral infarction without residual deficits: Secondary | ICD-10-CM | POA: Diagnosis not present

## 2023-11-23 DIAGNOSIS — E785 Hyperlipidemia, unspecified: Secondary | ICD-10-CM

## 2023-11-23 DIAGNOSIS — I63522 Cerebral infarction due to unspecified occlusion or stenosis of left anterior cerebral artery: Secondary | ICD-10-CM

## 2023-11-23 DIAGNOSIS — I10 Essential (primary) hypertension: Secondary | ICD-10-CM

## 2023-11-23 DIAGNOSIS — E782 Mixed hyperlipidemia: Secondary | ICD-10-CM | POA: Diagnosis not present

## 2023-11-23 MED ORDER — DONEPEZIL HCL 5 MG PO TABS: 5.0000 mg | ORAL_TABLET | Freq: Every day | ORAL | 3 refills | Status: AC

## 2023-11-23 MED ORDER — ROSUVASTATIN CALCIUM 20 MG PO TABS: 20.0000 mg | ORAL_TABLET | Freq: Every day | ORAL | 1 refills | Status: DC

## 2023-11-23 NOTE — Assessment & Plan Note (Signed)
 Chronic, ongoing.  Continue current medication regimen and adjust as needed.  Lipid panel at next visit in August.

## 2023-11-23 NOTE — Progress Notes (Signed)
 BP 132/68 (BP Location: Left Arm, Patient Position: Sitting, Cuff Size: Normal)   Pulse 76   Temp 97.7 F (36.5 C) (Oral)   Ht 5' 7 (1.702 m)   Wt 184 lb (83.5 kg)   LMP  (LMP Unknown)   SpO2 98%   BMI 28.82 kg/m    Subjective:    Patient ID: Angela Watts, female    DOB: 1950/12/22, 73 y.o.   MRN: [id number]  HPI: Angela Watts is a 73 y.o. female  Chief Complaint  Patient presents with   Hospitalization Follow-up    Patient was at [facility] from 11/08/23 - 11/09/23 for TIA   Transition of Care Hospital Follow up.  Follow-up today for recent hospitalization due to aphasia, with CVA noted. On MRI on 11/08/23 noted a 7 mm focus of diffusion involving posterior left frontal cortex, suspicious for acute CVA.  She reports the day she went into hospital, could not get any words out.  Now can get words out, but speech not 100%.  Sometimes while talking will stutter.  She is to stay on Plavix  for 21 days, until 12/06/23.  Saw neurology on 11/15/23, recommended a sleep study and placed order.  She is scheduled to see cardiology on 12/06/23 and is currently wearing Zio. She reports overall feeling better and denies any concerns today.  Hospital Course:  Angela Watts is a 73 y.o. female with medical history significant of hypertension, hyperlipidemia, GERD, memory change, psoriasis, rheumatoid arthritis on methotrexate , who presents with difficulty speaking.  Symptoms since has resolved after arriving the hospital. Stroke workup showed LDL of 85,CT angiogram of the neck and head did not show any significant occlusion.  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 Patient has been seen by neurology, recommended 21 days of Plavix  and continued aspirin .  Also continue statin. ZIO recorder will be mailed to the patient by cardiology, followed by a loop recorder in the office.     Assessment and Plan: Acute ischemic stroke of the left frontal lobe. Patient symptom has resolved, no  need for PT/OT. Discussed with neurology, will continue aspirin , 21 days of Plavix .  Crestor  increased to 20 mg daily. Discussed with cardiology, zip recorder was sent to the patient.  Then followed by a loop recorder in the office.   Essential hypertension. Resume home medicines   Rheumatoid arthritis Continue medicines.   Class III obesity with BMI 66.  Diet and exercise.  Hospital/Facility: [facility] D/C Physician: Dr. Laurita D/C Date: 11/09/23  Records Requested: 11/23/23 Records Received: 11/23/23 Records Reviewed: 11/23/23  Diagnoses on Discharge: TIA (transient ischemic attack)   Date of interactive Contact within 48 hours of discharge:  Contact was through: none noted  Date of 7 day or 14 day face-to-face visit:    within 14 days  Outpatient Encounter Medications as of 11/23/2023  Medication Sig Note   amLODipine  (NORVASC ) 5 MG tablet Take 1 tablet (5 mg total) by mouth daily.    aspirin  EC 81 MG tablet Take 1 tablet (81 mg total) by mouth daily. Swallow whole.    clopidogrel  (PLAVIX ) 75 MG tablet Take 1 tablet (75 mg total) by mouth daily for 21 days.    cyanocobalamin  (VITAMIN B12) 1000 MCG tablet Take 1,000 mcg by mouth daily.    famotidine  (PEPCID ) 20 MG tablet Take 20 mg by mouth daily as needed for heartburn or indigestion. 11/08/2023: PRN   folic acid  (FOLVITE ) 1 MG tablet Take 1 tablet by mouth  daily.    methotrexate  (RHEUMATREX) 2.5 MG tablet Take 7 tabs (17.5 MG ) once a week for 12 weeks    Multiple Vitamin (MULTIVITAMIN) capsule Take 1 capsule by mouth daily.    [DISCONTINUED] donepezil  (ARICEPT ) 5 MG tablet Take 1 tablet (5 mg total) by mouth at bedtime.    [DISCONTINUED] rosuvastatin  (CRESTOR ) 20 MG tablet Take 1 tablet (20 mg total) by mouth daily.    donepezil  (ARICEPT ) 5 MG tablet Take 1 tablet (5 mg total) by mouth at bedtime.    rosuvastatin  (CRESTOR ) 20 MG tablet Take 1 tablet (20 mg total) by mouth daily.    No facility-administered encounter medications on  file as of 11/23/2023.    Diagnostic Tests Reviewed/Disposition:     Latest Ref Rng & Units 11/08/2023    5:43 PM 09/18/2023   11:50 AM 05/09/2023   10:00 AM  CBC  WBC 4.0 - 10.5 K/uL 5.3  4.1  3.7   Hemoglobin 12.0 - 15.0 g/dL 87.0  87.0  87.0   Hematocrit 36.0 - 46.0 % 38.1  39.2  41.0   Platelets 150 - 400 K/uL 284  299  284     CMP     Component Value Date/Time   NA 135 11/08/2023 1743   NA 143 11/08/2023 1321   K 3.8 11/08/2023 1743   CL 103 11/08/2023 1743   CO2 23 11/08/2023 1743   GLUCOSE 92 11/08/2023 1743   BUN 15 11/08/2023 1743   BUN 12 11/08/2023 1321   CREATININE 0.62 11/08/2023 1743   CALCIUM  8.8 (L) 11/08/2023 1743   PROT 7.3 11/08/2023 1743   PROT 7.0 11/08/2023 1321   ALBUMIN 3.7 11/08/2023 1743   ALBUMIN 4.1 11/08/2023 1321   AST 21 11/08/2023 1743   ALT 18 11/08/2023 1743   ALKPHOS 110 11/08/2023 1743   BILITOT 0.6 11/08/2023 1743   BILITOT 0.4 11/08/2023 1321   EGFR 78 11/08/2023 1321   GFRNONAA >60 11/08/2023 1743    Consults: neurology  Discharge Instructions: Follow-up with PCP in 1 week. Follow-up with cardiology after ZIO recorder. Follow-up with neurology in 1 month.  Disease/illness Education: Reviewed at length with patient and discussed symptoms to immediately go to ER for.  Home Health/Community Services Discussions/Referrals: none  Establishment or re-establishment of referral orders for community resources: none  Discussion with other health care providers: Reviewed all recent notes  Assessment and Support of treatment regimen adherence: Reviewed at length with patient today  Appointments Coordinated with: Reviewed upcoming appointments with patient  Education for self-management, independent living, and ADLs:  Reviewed at length with patient  Relevant past medical, surgical, family and social history reviewed and updated as indicated. Interim medical history since our last visit reviewed. Allergies and medications reviewed and  updated.  Review of Systems  Constitutional:  Positive for fatigue (a little bit). Negative for activity change, appetite change, diaphoresis and fever.  Respiratory:  Positive for shortness of breath (occasional she reports at baseline). Negative for cough, chest tightness and wheezing.   Cardiovascular:  Negative for chest pain, palpitations and leg swelling.  Gastrointestinal: Negative.   Neurological:  Positive for speech difficulty (feels she stutters sometimes). Negative for dizziness, tremors, facial asymmetry, weakness, light-headedness, numbness and headaches.  Psychiatric/Behavioral: Negative.      Per HPI unless specifically indicated above     Objective:    BP 132/68 (BP Location: Left Arm, Patient Position: Sitting, Cuff Size: Normal)   Pulse 76   Temp 97.7 F (36.5 C) (  Oral)   Ht 5' 7 (1.702 m)   Wt 184 lb (83.5 kg)   LMP  (LMP Unknown)   SpO2 98%   BMI 28.82 kg/m   Wt Readings from Last 3 Encounters:  11/23/23 184 lb (83.5 kg)  11/08/23 (!) 414 lb 11 oz (188.1 kg)  11/08/23 185 lb (83.9 kg)    Physical Exam Vitals and nursing note reviewed.  Constitutional:      General: She is awake. She is not in acute distress.    Appearance: She is well-developed and well-groomed. She is not ill-appearing or toxic-appearing.  HENT:     Head: Normocephalic.     Right Ear: Hearing and external ear normal.     Left Ear: Hearing and external ear normal.  Eyes:     General: Lids are normal.        Right eye: No discharge.        Left eye: No discharge.     Conjunctiva/sclera: Conjunctivae normal.     Pupils: Pupils are equal, round, and reactive to light.  Neck:     Thyroid : No thyromegaly.     Vascular: No carotid bruit.  Cardiovascular:     Rate and Rhythm: Normal rate and regular rhythm.     Heart sounds: Normal heart sounds. No murmur heard.    No gallop.  Pulmonary:     Effort: Pulmonary effort is normal. No accessory muscle usage or respiratory distress.      Breath sounds: Normal breath sounds.  Abdominal:     General: Bowel sounds are normal. There is no distension.     Palpations: Abdomen is soft.     Tenderness: There is no abdominal tenderness.  Musculoskeletal:     Cervical back: Normal range of motion and neck supple.     Right lower leg: No edema.     Left lower leg: No edema.  Lymphadenopathy:     Cervical: No cervical adenopathy.  Skin:    General: Skin is warm and dry.  Neurological:     Mental Status: She is alert and oriented to person, place, and time.     Cranial Nerves: Cranial nerves 2-12 are intact.     Motor: Motor function is intact.     Coordination: Coordination is intact.     Gait: Gait is intact.     Deep Tendon Reflexes: Reflexes are normal and symmetric.     Reflex Scores:      Brachioradialis reflexes are 2+ on the right side and 2+ on the left side.      Patellar reflexes are 2+ on the right side and 2+ on the left side.    Comments: No slurred speech or facial drooping.  Oriented to person, place, time.  Psychiatric:        Attention and Perception: Attention normal.        Mood and Affect: Mood normal.        Speech: Speech normal.        Behavior: Behavior normal. Behavior is cooperative.        Thought Content: Thought content normal.     Results for orders placed or performed during the hospital encounter of 11/08/23  Protime-INR   Collection Time: 11/08/23  5:43 PM  Result Value Ref Range   Prothrombin Time 14.1 11.4 - 15.2 seconds   INR 1.1 0.8 - 1.2  APTT   Collection Time: 11/08/23  5:43 PM  Result Value Ref Range   aPTT 31 24 -  36 seconds  CBC   Collection Time: 11/08/23  5:43 PM  Result Value Ref Range   WBC 5.3 4.0 - 10.5 K/uL   RBC 4.20 3.87 - 5.11 MIL/uL   Hemoglobin 12.9 12.0 - 15.0 g/dL   HCT 61.8 63.9 - 53.9 %   MCV 90.7 80.0 - 100.0 fL   MCH 30.7 26.0 - 34.0 pg   MCHC 33.9 30.0 - 36.0 g/dL   RDW 85.1 88.4 - 84.4 %   Platelets 284 150 - 400 K/uL   nRBC 0.0 0.0 - 0.2 %   Differential   Collection Time: 11/08/23  5:43 PM  Result Value Ref Range   Neutrophils Relative % 54 %   Neutro Abs 2.9 1.7 - 7.7 K/uL   Lymphocytes Relative 35 %   Lymphs Abs 1.8 0.7 - 4.0 K/uL   Monocytes Relative 9 %   Monocytes Absolute 0.5 0.1 - 1.0 K/uL   Eosinophils Relative 1 %   Eosinophils Absolute 0.1 0.0 - 0.5 K/uL   Basophils Relative 1 %   Basophils Absolute 0.0 0.0 - 0.1 K/uL   Immature Granulocytes 0 %   Abs Immature Granulocytes 0.01 0.00 - 0.07 K/uL  Comprehensive metabolic panel   Collection Time: 11/08/23  5:43 PM  Result Value Ref Range   Sodium 135 135 - 145 mmol/L   Potassium 3.8 3.5 - 5.1 mmol/L   Chloride 103 98 - 111 mmol/L   CO2 23 22 - 32 mmol/L   Glucose, Bld 92 70 - 99 mg/dL   BUN 15 8 - 23 mg/dL   Creatinine, Ser 9.37 0.44 - 1.00 mg/dL   Calcium  8.8 (L) 8.9 - 10.3 mg/dL   Total Protein 7.3 6.5 - 8.1 g/dL   Albumin 3.7 3.5 - 5.0 g/dL   AST 21 15 - 41 U/L   ALT 18 0 - 44 U/L   Alkaline Phosphatase 110 38 - 126 U/L   Total Bilirubin 0.6 0.0 - 1.2 mg/dL   GFR, Estimated >39 >39 mL/min   Anion gap 9 5 - 15  Ethanol   Collection Time: 11/08/23  5:43 PM  Result Value Ref Range   Alcohol, Ethyl (B) <15 <15 mg/dL  Hemoglobin J8r   Collection Time: 11/08/23  5:43 PM  Result Value Ref Range   Hgb A1c MFr Bld 5.3 4.8 - 5.6 %   Mean Plasma Glucose 105.41 mg/dL  Lipid panel   Collection Time: 11/09/23  4:40 AM  Result Value Ref Range   Cholesterol 155 0 - 200 mg/dL   Triglycerides 35 <849 mg/dL   HDL 63 >59 mg/dL   Total CHOL/HDL Ratio 2.5 RATIO   VLDL 7 0 - 40 mg/dL   LDL Cholesterol 85 0 - 99 mg/dL  ECHOCARDIOGRAM COMPLETE   Collection Time: 11/09/23 12:24 PM  Result Value Ref Range   Weight 6,634.96 oz   Height 66 in   BP 123/68 mmHg   Ao pk vel 1.27 m/s   AV Area VTI 3.83 cm2   AR max vel 3.96 cm2   AV Mean grad 4.0 mmHg   AV Peak grad 6.5 mmHg   S' Lateral 2.50 cm   AV Area mean vel 3.88 cm2   Area-P 1/2 2.73 cm2   Est EF 55 -  60%       Assessment & Plan:   Problem List Items Addressed This Visit       Cardiovascular and Mediastinum   TIA (transient ischemic attack)   Refer  to history of stroke plan of care.      Relevant Medications   rosuvastatin  (CRESTOR ) 20 MG tablet   Hypertension   Chronic, stable.  BP at goal in office today and on home checks.  Will continue Amlodipine  5 MG daily and if elevations noted in upcoming visits will increase to 10 MG.  Remain off HCTZ due to urinary urgency with this in past.  LABS: up to date.  Focus on DASH diet and regular exercise at home.  Monitor BP daily.        Relevant Medications   rosuvastatin  (CRESTOR ) 20 MG tablet     Other   Memory changes   Ongoing for some time per patient's daughter.  6CIT was 4 in past and MMSE 28/30 in past. Difficulty with short term recall noted on both assessments.  Remainder of check was stable.  Recent labs were overall reassuring. Continue Aricept  5 MG daily and adjust as needed.  Educated on medication and possible side effects.  Having no ADR with this.  Recent imaging did not note any atrophy, but did note chronic infarct present.  Monitor BP and maintain good control.  Continue to collaborate with neurology.      Hyperlipidemia   Chronic, ongoing.  Continue current medication regimen and adjust as needed.  Lipid panel at next visit in August.      Relevant Medications   rosuvastatin  (CRESTOR ) 20 MG tablet   History of stroke in adulthood - Primary   Stable at this time with reassuring neuro exam.  Continue all medications as ordered.  Educated her on symptoms to immediately go to ER for.  Goals A1c < 6.5%, LDL <55, and BP <130/80.  Discussed with her and will check lipid panel next visit as just changed to Rosuvastatin  20 MG daily.        Follow up plan: Return for as scheduled August 11th.

## 2023-11-23 NOTE — Assessment & Plan Note (Signed)
 Ongoing for some time per patient's daughter.  6CIT was 4 in past and MMSE 28/30 in past. Difficulty with short term recall noted on both assessments.  Remainder of check was stable.  Recent labs were overall reassuring. Continue Aricept  5 MG daily and adjust as needed.  Educated on medication and possible side effects.  Having no ADR with this.  Recent imaging did not note any atrophy, but did note chronic infarct present.  Monitor BP and maintain good control.  Continue to collaborate with neurology.

## 2023-11-23 NOTE — Assessment & Plan Note (Signed)
 Refer to history of stroke plan of care.

## 2023-11-23 NOTE — Assessment & Plan Note (Signed)
 Stable at this time with reassuring neuro exam.  Continue all medications as ordered.  Educated her on symptoms to immediately go to ER for.  Goals A1c < 6.5%, LDL <55, and BP <130/80.  Discussed with her and will check lipid panel next visit as just changed to Rosuvastatin  20 MG daily.

## 2023-11-23 NOTE — Assessment & Plan Note (Signed)
 Chronic, stable.  BP at goal in office today and on home checks.  Will continue Amlodipine  5 MG daily and if elevations noted in upcoming visits will increase to 10 MG.  Remain off HCTZ due to urinary urgency with this in past.  LABS: up to date.  Focus on DASH diet and regular exercise at home.  Monitor BP daily.

## 2023-11-29 ENCOUNTER — Ambulatory Visit: Admitting: Nurse Practitioner

## 2023-12-06 ENCOUNTER — Ambulatory Visit: Attending: Internal Medicine

## 2023-12-06 ENCOUNTER — Ambulatory Visit: Admitting: Cardiology

## 2023-12-06 DIAGNOSIS — I10 Essential (primary) hypertension: Secondary | ICD-10-CM | POA: Insufficient documentation

## 2023-12-06 DIAGNOSIS — G4733 Obstructive sleep apnea (adult) (pediatric): Secondary | ICD-10-CM | POA: Diagnosis not present

## 2023-12-06 DIAGNOSIS — R0683 Snoring: Secondary | ICD-10-CM | POA: Insufficient documentation

## 2023-12-06 DIAGNOSIS — Z8673 Personal history of transient ischemic attack (TIA), and cerebral infarction without residual deficits: Secondary | ICD-10-CM | POA: Diagnosis not present

## 2023-12-06 NOTE — Progress Notes (Deleted)
  Electrophysiology Office Note:   Date:  12/06/2023  ID:  SYLVER VANTASSELL, DOB Aug 03, 1950, MRN [id number]  Primary Cardiologist: None Electrophysiologist: None  {Click to update primary MD,subspecialty MD or APP then REFRESH:1}    History of Present Illness:   Angela Watts is a 73 y.o. female with h/o hypertension, hyperlipidemia, GERD, memory change, psoriasis, rheumatoid arthritis on methotrexate  and CVA who is being seen today for loop recorder implant.  Patient presented to ED on 11/08/23 with aphasia. 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   Discussed the use of AI scribe software for clinical note transcription with the patient, who gave verbal consent to proceed.  History of Present Illness     Review of systems complete and found to be negative unless listed in HPI.   EP Information / Studies Reviewed:    {EKGtoday:28818}      Echo 11/09/23:   1. Left ventricular ejection fraction, by estimation, is 55 to 60%. Left  ventricular ejection fraction by PLAX is 66 %. The left ventricle has  normal function. The left ventricle has no regional wall motion  abnormalities. Left ventricular diastolic  parameters are consistent with Grade I diastolic dysfunction (impaired  relaxation).   2. Right ventricular systolic function is normal. The right ventricular  size is normal. There is normal pulmonary artery systolic pressure. The  estimated right ventricular systolic pressure is 33.7 mmHg.   3. The mitral valve is normal in structure. No evidence of mitral valve  regurgitation. No evidence of mitral stenosis.   4. Tricuspid valve regurgitation is moderate.   5. The aortic valve is normal in structure. Aortic valve regurgitation is  not visualized. Aortic valve sclerosis is present, with no evidence of  aortic valve stenosis.   6. The inferior vena cava is normal in size with greater than 50%  respiratory variability, suggesting right atrial pressure of 3  mmHg.   Risk Assessment/Calculations:   {Does this patient have ATRIAL FIBRILLATION?:[phone]} No BP recorded.  {Refresh Note OR Click here to enter BP  :1}***        Physical Exam:   VS:  LMP  (LMP Unknown)    Wt Readings from Last 3 Encounters:  11/23/23 184 lb (83.5 kg)  11/08/23 (!) 414 lb 11 oz (188.1 kg)  11/08/23 185 lb (83.9 kg)     GEN: Well nourished, well developed in no acute distress NECK: No JVD CARDIAC: {EPRHYTHM:28826}, no murmurs, rubs, gallops RESPIRATORY:  Clear to auscultation without rales, wheezing or rhonchi  ABDOMEN: Soft, non-distended EXTREMITIES:  No edema; No deformity   ASSESSMENT AND PLAN:    #. Cryptogenic stroke:   #. Hypertension *** goal today.  Recommend checking blood pressures 1-2 times per week at home and recording the values.  Recommend bringing these recordings to the primary care physician. Assessment & Plan       Follow up with {EPMDS:28135::EP Team} {EPFOLLOW LE:71826}  Signed, Fonda Kitty, MD

## 2023-12-07 ENCOUNTER — Other Ambulatory Visit: Payer: Self-pay | Admitting: Nurse Practitioner

## 2023-12-09 NOTE — Telephone Encounter (Signed)
 Requested Prescriptions  Pending Prescriptions Disp Refills   amLODipine  (NORVASC ) 5 MG tablet [Pharmacy Med Name: AMLODIPINE  BESYLATE 5 MG Oral Tablet] 90 tablet 0    Sig: TAKE 1 TABLET (5 MG TOTAL) BY MOUTH DAILY.     Cardiovascular: Calcium  Channel Blockers 2 Passed - 12/09/2023  9:15 AM      Passed - Last BP in normal range    BP Readings from Last 1 Encounters:  11/23/23 132/68         Passed - Last Heart Rate in normal range    Pulse Readings from Last 1 Encounters:  11/23/23 76         Passed - Valid encounter within last 6 months    Recent Outpatient Visits           2 weeks ago History of stroke in adulthood   [place] [facility] [place], [place] T, NP   1 month ago Aphasia   [place] [facility] Melvin Pao, NP   2 months ago Right flank pain   [place] [facility] [place], [place] T, NP   3 months ago Depression, major, single episode, mild (HCC)   [place] [facility] [place], Melanie DASEN, NP   5 months ago Memory changes   [place] [facility] [place], Melanie DASEN, NP

## 2023-12-10 DIAGNOSIS — G4730 Sleep apnea, unspecified: Secondary | ICD-10-CM | POA: Diagnosis not present

## 2023-12-14 DIAGNOSIS — I639 Cerebral infarction, unspecified: Secondary | ICD-10-CM

## 2023-12-17 ENCOUNTER — Ambulatory Visit: Payer: Self-pay | Admitting: Cardiology

## 2023-12-24 NOTE — Patient Instructions (Signed)
Be Involved in Caring For Your Health:  Taking Medications When medications are taken as directed, they can greatly improve your health. But if they are not taken as prescribed, they may not work. In some cases, not taking them correctly can be harmful. To help ensure your treatment remains effective and safe, understand your medications and how to take them. Bring your medications to each visit for review by your provider.  Your lab results, notes, and after visit summary will be available on My Chart. We strongly encourage you to use this feature. If lab results are abnormal the clinic will contact you with the appropriate steps. If the clinic does not contact you assume the results are satisfactory. You can always view your results on My Chart. If you have questions regarding your health or results, please contact the clinic during office hours. You can also ask questions on My Chart.  We at [facility] are grateful that you chose [place] to provide your care. We strive to provide evidence-based and compassionate care and are always looking for feedback. If you get a survey from the clinic please complete this so we can hear your opinions.  Memory Compensation Strategies  Use "WARM" strategy.  W= write it down  A= associate it  R= repeat it  M= make a mental note  2.   You can keep a Glass blower/designer.  Use a 3-ring notebook with sections for the following: calendar, important names and phone numbers,  medications, doctors' names/phone numbers, lists/reminders, and a section to journal what you did  each day.   3.    Use a calendar to write appointments down.  4.    Write yourself a schedule for the day.  This can be placed on the calendar or in a separate section of the Memory Notebook.  Keeping a  regular schedule can help memory.  5.    Use medication organizer with sections for each day or morning/evening pills.  You may need help loading it  6.    Keep a basket, or pegboard  by the door.  Place items that you need to take out with you in the basket or on the pegboard.  You may also want to  include a message board for reminders.  7.    Use sticky notes.  Place sticky notes with reminders in a place where the task is performed.  For example: " turn off the  stove" placed by the stove, "lock the door" placed on the door at eye level, " take your medications" on  the bathroom mirror or by the place where you normally take your medications.  8.    Use alarms/timers.  Use while cooking to remind yourself to check on food or as a reminder to take your medicine, or as a  reminder to make a call, or as a reminder to perform another task, etc.

## 2023-12-26 ENCOUNTER — Encounter: Payer: Self-pay | Admitting: Nurse Practitioner

## 2023-12-26 ENCOUNTER — Ambulatory Visit (INDEPENDENT_AMBULATORY_CARE_PROVIDER_SITE_OTHER): Admitting: Nurse Practitioner

## 2023-12-26 VITALS — BP 129/66 | HR 81 | Temp 98.3°F | Ht 67.0 in | Wt 183.6 lb

## 2023-12-26 DIAGNOSIS — E782 Mixed hyperlipidemia: Secondary | ICD-10-CM

## 2023-12-26 DIAGNOSIS — Z79631 Long term (current) use of antimetabolite agent: Secondary | ICD-10-CM

## 2023-12-26 DIAGNOSIS — E66812 Obesity, class 2: Secondary | ICD-10-CM

## 2023-12-26 DIAGNOSIS — L409 Psoriasis, unspecified: Secondary | ICD-10-CM | POA: Diagnosis not present

## 2023-12-26 DIAGNOSIS — R413 Other amnesia: Secondary | ICD-10-CM | POA: Diagnosis not present

## 2023-12-26 DIAGNOSIS — Z8673 Personal history of transient ischemic attack (TIA), and cerebral infarction without residual deficits: Secondary | ICD-10-CM | POA: Diagnosis not present

## 2023-12-26 DIAGNOSIS — F320 Major depressive disorder, single episode, mild: Secondary | ICD-10-CM | POA: Diagnosis not present

## 2023-12-26 DIAGNOSIS — Z6835 Body mass index (BMI) 35.0-35.9, adult: Secondary | ICD-10-CM

## 2023-12-26 DIAGNOSIS — Z79899 Other long term (current) drug therapy: Secondary | ICD-10-CM | POA: Diagnosis not present

## 2023-12-26 DIAGNOSIS — I10 Essential (primary) hypertension: Secondary | ICD-10-CM

## 2023-12-26 DIAGNOSIS — M170 Bilateral primary osteoarthritis of knee: Secondary | ICD-10-CM | POA: Diagnosis not present

## 2023-12-26 DIAGNOSIS — M059 Rheumatoid arthritis with rheumatoid factor, unspecified: Secondary | ICD-10-CM | POA: Diagnosis not present

## 2023-12-26 NOTE — Addendum Note (Signed)
 Addended by: Darolyn Double T on: 12/26/2023 11:37 AM   Modules accepted: Level of Service

## 2023-12-26 NOTE — Assessment & Plan Note (Signed)
Check CMP and CBC today -- continue collaboration with rheumatology, recent notes reviewed.

## 2023-12-26 NOTE — Progress Notes (Signed)
 BP 129/66   Pulse 81   Temp 98.3 F (36.8 C) (Oral)   Ht 5' 7 (1.702 m)   Wt 183 lb 9.6 oz (83.3 kg)   LMP  (LMP Unknown)   SpO2 96%   BMI 28.76 kg/m    Subjective:    Patient ID: Angela Watts, female    DOB: 29-Sep-1950, 73 y.o.   MRN: [id number]  HPI: Angela Watts is a 73 y.o. female  Chief Complaint  Patient presents with   Hyperlipidemia   Hypertension   Memory Loss   Rheumatoid Arthritis   HYPERTENSION / HYPERLIPIDEMIA & MEMORY CHANGES Taking Amlodipine  and Crestor  (three times weekly).  Memory changes continue, but she reports these are stable.  Able to get from point A to B.  Continues to have some difficulty getting words out after her stroke on 11/08/23.  Able to think of word, but can not get out.  Saw neurology on 11/15/23, is off Plavix  now per instructions. Had sleep study which did note some apnea, she is to get CPAP.  To see cardiology on 01/18/24 to discuss loop recorder to further assess for abnormal rhythm.  Able to dress and feed self.  No incontinence issues.   Has history of mild elevation ALK PHOS, ongoing, last 133.  Denies any abdominal pain or N&V.  Still has gall bladder. Satisfied with current treatment? yes Duration of hypertension: chronic BP monitoring frequency: not recently BP range:  BP medication side effects: no Duration of hyperlipidemia: chronic Cholesterol medication side effects: no Cholesterol supplements: none Medication compliance: excellent compliance Aspirin : no Recent stressors: no Recurrent headaches: no Visual changes: no Palpitations: no Dyspnea: with exertion at times, baseline Chest pain:no Lower extremity edema: no Dizzy/lightheaded: no     06/02/2023   10:13 AM 05/09/2023    9:49 AM 03/28/2020   11:21 AM 03/26/2019   11:12 AM 03/23/2018   10:20 AM  6CIT Screen  What Year? 0 points 0 points 0 points 0 points 0 points  What month? 0 points 0 points 0 points 0 points 0 points  What time? 0 points 0 points 0  points 0 points 0 points  Count back from 20 0 points 0 points 2 points 0 points 0 points  Months in reverse 0 points 0 points 0 points 0 points 0 points  Repeat phrase 0 points 4 points 0 points 0 points 4 points  Total Score 0 points 4 points 2 points 0 points 4 points   RHEUMATOID ARTHRITIS Continues to follow with Dr. Tobie and last seen 08/24/23 -- no changes made. Continues Methotrexate  7 tablets (17.5 MG) once a week and daily folic acid . Duration: months Pain: yes Symmetric: yes  mild Quality: aching and throbbing Frequency: intermittent Context:  stable Decreased function/range of motion: no Erythema: no Swelling: no Heat or warmth: no Morning stiffness: yes  Aggravating factors: None Alleviating factors: Improves with movement Relief with NSAIDs?: No NSAIDs Taken Treatments attempted:  none Involved Joints:     Hands: yes, bilateral    Wrists: no     Elbows: no     Shoulders: yes    Back: yes, occasional    Hips: no     Knees: yes bilateral    Ankles: no     Feet: no    DEPRESSION No current medications. Mood status: stable Satisfied with current treatment?: yes Symptom severity: mild  Psychotherapy/counseling: none Depressed mood: occasional Anxious mood: no Anhedonia: no Significant weight loss or gain:  no Insomnia: none Fatigue: no Feelings of worthlessness or guilt: no Impaired concentration/indecisiveness: no Suicidal ideations: no Hopelessness: no Crying spells: no    12/26/2023   10:36 AM 11/23/2023    8:31 AM 09/22/2023    1:49 PM 08/24/2023   10:28 AM 08/24/2023   10:22 AM  Depression screen PHQ 2/9  Decreased Interest 1 0 1 1 1   Down, Depressed, Hopeless 1 0 1 1 1   PHQ - 2 Score 2 0 2 2 2   Altered sleeping 1 1 0 2 2  Tired, decreased energy 2 1 0 1 1  Change in appetite 0 0 0 1 1  Feeling bad or failure about yourself  0 0 0 1 1  Trouble concentrating 0 1 0 0 1  Moving slowly or fidgety/restless 0 1 0 0 1  Suicidal thoughts 0 0 0 0 1   PHQ-9 Score 5 4 2 7 10   Difficult doing work/chores Somewhat difficult Not difficult at all Not difficult at all Not difficult at all Somewhat difficult       11/23/2023    8:31 AM 08/24/2023   10:22 AM 07/05/2023    2:45 PM 05/09/2023    9:49 AM  GAD 7 : Generalized Anxiety Score  Nervous, Anxious, on Edge 0 1 1 0  Control/stop worrying 1 1 1  0  Worry too much - different things 0 1 2 0  Trouble relaxing 0 1 1 0  Restless 0 0 0 0  Easily annoyed or irritable 1 1 0 0  Afraid - awful might happen 0 1 1 0  Total GAD 7 Score 2 6 6  0  Anxiety Difficulty Not difficult at all Somewhat difficult Not difficult at all Not difficult at all   Relevant past medical, surgical, family and social history reviewed and updated as indicated. Interim medical history since our last visit reviewed. Allergies and medications reviewed and updated.  Review of Systems  Constitutional:  Negative for chills, fatigue and fever.  HENT:  Negative for facial swelling, sinus pressure and sinus pain.   Eyes: Negative.   Respiratory:  Negative for chest tightness and shortness of breath.   Cardiovascular:  Negative for chest pain and leg swelling.  Gastrointestinal: Negative.   Endocrine: Negative for polydipsia, polyphagia and polyuria.  Genitourinary: Negative.   Musculoskeletal:  Positive for arthralgias.  Skin: Negative.   Allergic/Immunologic: Negative.   Neurological:  Negative for dizziness, weakness, numbness and headaches.  Hematological: Negative.   Psychiatric/Behavioral: Negative.     Per HPI unless specifically indicated above     Objective:    BP 129/66   Pulse 81   Temp 98.3 F (36.8 C) (Oral)   Ht 5' 7 (1.702 m)   Wt 183 lb 9.6 oz (83.3 kg)   LMP  (LMP Unknown)   SpO2 96%   BMI 28.76 kg/m   Wt Readings from Last 3 Encounters:  12/26/23 183 lb 9.6 oz (83.3 kg)  11/23/23 184 lb (83.5 kg)  11/08/23 (!) 414 lb 11 oz (188.1 kg)    Physical Exam Vitals and nursing note reviewed.   Constitutional:      General: She is awake. She is not in acute distress.    Appearance: She is well-developed and well-groomed. She is not ill-appearing or toxic-appearing.  HENT:     Head: Normocephalic.     Right Ear: Hearing and external ear normal. No drainage.     Left Ear: Hearing and external ear normal. No drainage.  Eyes:  General: Lids are normal.        Right eye: No discharge.        Left eye: No discharge.     Conjunctiva/sclera: Conjunctivae normal.  Neck:     Thyroid : No thyromegaly.     Vascular: No carotid bruit.  Cardiovascular:     Rate and Rhythm: Normal rate and regular rhythm.     Heart sounds: Normal heart sounds. No murmur heard.    No gallop.  Pulmonary:     Effort: Pulmonary effort is normal. No accessory muscle usage or respiratory distress.     Breath sounds: Normal breath sounds.  Abdominal:     General: Bowel sounds are normal. There is no distension.     Palpations: Abdomen is soft.     Tenderness: There is no abdominal tenderness.  Musculoskeletal:     Cervical back: Normal range of motion.     Right lower leg: No edema.     Left lower leg: No edema.  Lymphadenopathy:     Cervical: No cervical adenopathy.  Neurological:     Mental Status: She is alert and oriented to person, place, and time.     Cranial Nerves: Cranial nerves 2-12 are intact.     Motor: Motor function is intact.     Coordination: Coordination is intact.     Gait: Gait is intact.     Deep Tendon Reflexes: Reflexes are normal and symmetric.     Reflex Scores:      Brachioradialis reflexes are 2+ on the right side and 2+ on the left side.      Patellar reflexes are 2+ on the right side and 2+ on the left side.    Comments: Oriented to person, place, time, year, day of week, and current Economist.  Psychiatric:        Attention and Perception: Attention normal.        Mood and Affect: Mood normal.        Speech: Speech normal.        Behavior: Behavior normal. Behavior  is cooperative.        Thought Content: Thought content normal.        Judgment: Judgment normal.    Results for orders placed or performed during the hospital encounter of 11/08/23  Protime-INR   Collection Time: 11/08/23  5:43 PM  Result Value Ref Range   Prothrombin Time 14.1 11.4 - 15.2 seconds   INR 1.1 0.8 - 1.2  APTT   Collection Time: 11/08/23  5:43 PM  Result Value Ref Range   aPTT 31 24 - 36 seconds  CBC   Collection Time: 11/08/23  5:43 PM  Result Value Ref Range   WBC 5.3 4.0 - 10.5 K/uL   RBC 4.20 3.87 - 5.11 MIL/uL   Hemoglobin 12.9 12.0 - 15.0 g/dL   HCT 61.8 63.9 - 53.9 %   MCV 90.7 80.0 - 100.0 fL   MCH 30.7 26.0 - 34.0 pg   MCHC 33.9 30.0 - 36.0 g/dL   RDW 85.1 88.4 - 84.4 %   Platelets 284 150 - 400 K/uL   nRBC 0.0 0.0 - 0.2 %  Differential   Collection Time: 11/08/23  5:43 PM  Result Value Ref Range   Neutrophils Relative % 54 %   Neutro Abs 2.9 1.7 - 7.7 K/uL   Lymphocytes Relative 35 %   Lymphs Abs 1.8 0.7 - 4.0 K/uL   Monocytes Relative 9 %   Monocytes Absolute 0.5  0.1 - 1.0 K/uL   Eosinophils Relative 1 %   Eosinophils Absolute 0.1 0.0 - 0.5 K/uL   Basophils Relative 1 %   Basophils Absolute 0.0 0.0 - 0.1 K/uL   Immature Granulocytes 0 %   Abs Immature Granulocytes 0.01 0.00 - 0.07 K/uL  Comprehensive metabolic panel   Collection Time: 11/08/23  5:43 PM  Result Value Ref Range   Sodium 135 135 - 145 mmol/L   Potassium 3.8 3.5 - 5.1 mmol/L   Chloride 103 98 - 111 mmol/L   CO2 23 22 - 32 mmol/L   Glucose, Bld 92 70 - 99 mg/dL   BUN 15 8 - 23 mg/dL   Creatinine, Ser 9.37 0.44 - 1.00 mg/dL   Calcium  8.8 (L) 8.9 - 10.3 mg/dL   Total Protein 7.3 6.5 - 8.1 g/dL   Albumin 3.7 3.5 - 5.0 g/dL   AST 21 15 - 41 U/L   ALT 18 0 - 44 U/L   Alkaline Phosphatase 110 38 - 126 U/L   Total Bilirubin 0.6 0.0 - 1.2 mg/dL   GFR, Estimated >39 >39 mL/min   Anion gap 9 5 - 15  Ethanol   Collection Time: 11/08/23  5:43 PM  Result Value Ref Range    Alcohol, Ethyl (B) <15 <15 mg/dL  Hemoglobin J8r   Collection Time: 11/08/23  5:43 PM  Result Value Ref Range   Hgb A1c MFr Bld 5.3 4.8 - 5.6 %   Mean Plasma Glucose 105.41 mg/dL  Lipid panel   Collection Time: 11/09/23  4:40 AM  Result Value Ref Range   Cholesterol 155 0 - 200 mg/dL   Triglycerides 35 <849 mg/dL   HDL 63 >59 mg/dL   Total CHOL/HDL Ratio 2.5 RATIO   VLDL 7 0 - 40 mg/dL   LDL Cholesterol 85 0 - 99 mg/dL  ECHOCARDIOGRAM COMPLETE   Collection Time: 11/09/23 12:24 PM  Result Value Ref Range   Weight 6,634.96 oz   Height 66 in   BP 123/68 mmHg   Ao pk vel 1.27 m/s   AV Area VTI 3.83 cm2   AR max vel 3.96 cm2   AV Mean grad 4.0 mmHg   AV Peak grad 6.5 mmHg   S' Lateral 2.50 cm   AV Area mean vel 3.88 cm2   Area-P 1/2 2.73 cm2   Est EF 55 - 60%       Assessment & Plan:   Problem List Items Addressed This Visit       Cardiovascular and Mediastinum   Hypertension   Chronic, stable.  BP at goal in office today and on home checks. Will continue Amlodipine  5 MG daily and if elevations noted in upcoming visits will increase to 10 MG.  Remain off HCTZ due to urinary urgency with this in past.  LABS: CBC, CMP.  Focus on DASH diet and regular exercise at home.  Monitor BP daily.        Relevant Orders   CBC with Differential/Platelet   Comprehensive metabolic panel with GFR     Musculoskeletal and Integument   Rheumatoid arthritis, seropositive (HCC) - Primary   Chronic, ongoing.  Continue collaboration with rheumatology + current medication regimen.  Recent notes and labs reviewed.  Check CMP today.        Other   Obesity   BMI 28.76, continues to work on weight loss.  Recommended eating smaller high protein, low fat meals more frequently and exercising 30 mins a day 5  times a week with a goal of 10-15lb weight loss in the next 3 months. Patient voiced their understanding and motivation to adhere to these recommendations.       Memory changes   Ongoing and  stable at present.  6CIT was 4 in past and MMSE 28/30 in past. Difficulty with short term recall noted on both assessments.  Remainder of check was stable.  Recent labs were overall reassuring. Continue Aricept  5 MG daily and adjust as needed.  Educated on medication and possible side effects.  Having no ADR with this.  Recent imaging did not note any atrophy, but did note chronic infarct present.  Monitor BP and maintain good control.  Continue to collaborate with neurology.      Relevant Orders   Ambulatory referral to Home Health   Long term methotrexate  user   Check CMP and CBC today -- continue collaboration with rheumatology, recent notes reviewed.      Hyperlipidemia   Chronic, ongoing.  Continue current medication regimen and adjust as needed.  Lipid panel today.      Relevant Orders   Comprehensive metabolic panel with GFR   Lipid Panel w/o Chol/HDL Ratio   History of stroke in adulthood   Stable at this time with reassuring neuro exam.  Continue all medications as ordered.  Educated her on symptoms to immediately go to ER for.  Goals A1c < 6.5%, LDL <55, and BP <130/80.  Discussed with her and will check lipid panel today.  Referral for speech therapy placed to work on her mild aphasia.      Relevant Orders   Comprehensive metabolic panel with GFR   Lipid Panel w/o Chol/HDL Ratio   Ambulatory referral to Home Health   Depression, major, single episode, mild (HCC)   Stable without medication.  Denies SI/HI.  Discussed treatment options with her, SSRI family to start.  Would benefit low dose Zoloft 25 MG in future if needed.  Discussed side effects and BLACK BOX warning with her.  Recommend she discuss with daughter and will start medication if requested by her in future.        Follow up plan: Return in about 4 months (around 04/26/2024) for HTN/HLD -- H/O STROKE.

## 2023-12-26 NOTE — Assessment & Plan Note (Signed)
 Chronic, ongoing.  Continue collaboration with rheumatology + current medication regimen.  Recent notes and labs reviewed.  Check CMP today.

## 2023-12-26 NOTE — Assessment & Plan Note (Signed)
 Ongoing and stable at present.  6CIT was 4 in past and MMSE 28/30 in past. Difficulty with short term recall noted on both assessments.  Remainder of check was stable.  Recent labs were overall reassuring. Continue Aricept  5 MG daily and adjust as needed.  Educated on medication and possible side effects.  Having no ADR with this.  Recent imaging did not note any atrophy, but did note chronic infarct present.  Monitor BP and maintain good control.  Continue to collaborate with neurology.

## 2023-12-26 NOTE — Assessment & Plan Note (Signed)
 BMI 28.76, continues to work on weight loss.  Recommended eating smaller high protein, low fat meals more frequently and exercising 30 mins a day 5 times a week with a goal of 10-15lb weight loss in the next 3 months. Patient voiced their understanding and motivation to adhere to these recommendations.

## 2023-12-26 NOTE — Assessment & Plan Note (Signed)
 Chronic, stable.  BP at goal in office today and on home checks. Will continue Amlodipine  5 MG daily and if elevations noted in upcoming visits will increase to 10 MG.  Remain off HCTZ due to urinary urgency with this in past.  LABS: CBC, CMP.  Focus on DASH diet and regular exercise at home.  Monitor BP daily.

## 2023-12-26 NOTE — Assessment & Plan Note (Signed)
 Stable without medication.  Denies SI/HI.  Discussed treatment options with her, SSRI family to start.  Would benefit low dose Zoloft 25 MG in future if needed.  Discussed side effects and BLACK BOX warning with her.  Recommend she discuss with daughter and will start medication if requested by her in future.

## 2023-12-26 NOTE — Assessment & Plan Note (Signed)
 Chronic, ongoing.  Continue current medication regimen and adjust as needed. Lipid panel today.

## 2023-12-26 NOTE — Assessment & Plan Note (Signed)
 Stable at this time with reassuring neuro exam.  Continue all medications as ordered.  Educated her on symptoms to immediately go to ER for.  Goals A1c < 6.5%, LDL <55, and BP <130/80.  Discussed with her and will check lipid panel today.  Referral for speech therapy placed to work on her mild aphasia.

## 2023-12-27 ENCOUNTER — Ambulatory Visit: Payer: Self-pay | Admitting: Nurse Practitioner

## 2023-12-27 LAB — CBC WITH DIFFERENTIAL/PLATELET
Basophils Absolute: 0 x10E3/uL (ref 0.0–0.2)
Basos: 1 %
EOS (ABSOLUTE): 0 x10E3/uL (ref 0.0–0.4)
Eos: 1 %
Hematocrit: 41.4 % (ref 34.0–46.6)
Hemoglobin: 13.4 g/dL (ref 11.1–15.9)
Immature Grans (Abs): 0 x10E3/uL (ref 0.0–0.1)
Immature Granulocytes: 0 %
Lymphocytes Absolute: 1.6 x10E3/uL (ref 0.7–3.1)
Lymphs: 41 %
MCH: 30.6 pg (ref 26.6–33.0)
MCHC: 32.4 g/dL (ref 31.5–35.7)
MCV: 95 fL (ref 79–97)
Monocytes Absolute: 0.4 x10E3/uL (ref 0.1–0.9)
Monocytes: 9 %
Neutrophils Absolute: 2 x10E3/uL (ref 1.4–7.0)
Neutrophils: 48 %
Platelets: 204 x10E3/uL (ref 150–450)
RBC: 4.38 x10E6/uL (ref 3.77–5.28)
RDW: 14.5 % (ref 11.7–15.4)
WBC: 4.1 x10E3/uL (ref 3.4–10.8)

## 2023-12-27 LAB — LIPID PANEL W/O CHOL/HDL RATIO
Cholesterol, Total: 161 mg/dL (ref 100–199)
HDL: 73 mg/dL (ref >39)
LDL Chol Calc (NIH): 78 mg/dL (ref 0–99)
Triglycerides: 48 mg/dL (ref 0–149)
VLDL Cholesterol Cal: 10 mg/dL (ref 5–40)

## 2023-12-27 LAB — COMPREHENSIVE METABOLIC PANEL WITH GFR
ALT: 25 IU/L (ref 0–32)
AST: 22 IU/L (ref 0–40)
Albumin: 4.3 g/dL (ref 3.8–4.8)
Alkaline Phosphatase: 138 IU/L — ABNORMAL HIGH (ref 44–121)
BUN/Creatinine Ratio: 17 (ref 12–28)
BUN: 12 mg/dL (ref 8–27)
Bilirubin Total: 0.4 mg/dL (ref 0.0–1.2)
CO2: 23 mmol/L (ref 20–29)
Calcium: 9.5 mg/dL (ref 8.7–10.3)
Chloride: 104 mmol/L (ref 96–106)
Creatinine, Ser: 0.7 mg/dL (ref 0.57–1.00)
Globulin, Total: 2.3 g/dL (ref 1.5–4.5)
Glucose: 91 mg/dL (ref 70–99)
Potassium: 4 mmol/L (ref 3.5–5.2)
Sodium: 143 mmol/L (ref 134–144)
Total Protein: 6.6 g/dL (ref 6.0–8.5)
eGFR: 90 mL/min/1.73 (ref >59)

## 2023-12-27 NOTE — Progress Notes (Signed)
 Good morning, please let Angela Watts know her labs have returned and everything looks good.  No changes needed.  Have an amazing day!!

## 2023-12-30 ENCOUNTER — Other Ambulatory Visit: Payer: Self-pay | Admitting: Nurse Practitioner

## 2023-12-30 DIAGNOSIS — Z1231 Encounter for screening mammogram for malignant neoplasm of breast: Secondary | ICD-10-CM

## 2023-12-30 DIAGNOSIS — I10 Essential (primary) hypertension: Secondary | ICD-10-CM | POA: Diagnosis not present

## 2023-12-30 DIAGNOSIS — I69320 Aphasia following cerebral infarction: Secondary | ICD-10-CM | POA: Diagnosis not present

## 2023-12-30 DIAGNOSIS — F320 Major depressive disorder, single episode, mild: Secondary | ICD-10-CM | POA: Diagnosis not present

## 2023-12-30 DIAGNOSIS — Z6828 Body mass index (BMI) 28.0-28.9, adult: Secondary | ICD-10-CM | POA: Diagnosis not present

## 2023-12-30 DIAGNOSIS — E782 Mixed hyperlipidemia: Secondary | ICD-10-CM | POA: Diagnosis not present

## 2023-12-30 DIAGNOSIS — R413 Other amnesia: Secondary | ICD-10-CM | POA: Diagnosis not present

## 2023-12-30 DIAGNOSIS — M0589 Other rheumatoid arthritis with rheumatoid factor of multiple sites: Secondary | ICD-10-CM | POA: Diagnosis not present

## 2023-12-30 DIAGNOSIS — E66812 Obesity, class 2: Secondary | ICD-10-CM | POA: Diagnosis not present

## 2024-01-03 DIAGNOSIS — M0589 Other rheumatoid arthritis with rheumatoid factor of multiple sites: Secondary | ICD-10-CM | POA: Diagnosis not present

## 2024-01-03 DIAGNOSIS — F320 Major depressive disorder, single episode, mild: Secondary | ICD-10-CM | POA: Diagnosis not present

## 2024-01-03 DIAGNOSIS — I10 Essential (primary) hypertension: Secondary | ICD-10-CM | POA: Diagnosis not present

## 2024-01-03 DIAGNOSIS — I69320 Aphasia following cerebral infarction: Secondary | ICD-10-CM | POA: Diagnosis not present

## 2024-01-03 DIAGNOSIS — E66812 Obesity, class 2: Secondary | ICD-10-CM | POA: Diagnosis not present

## 2024-01-03 DIAGNOSIS — R413 Other amnesia: Secondary | ICD-10-CM | POA: Diagnosis not present

## 2024-01-10 DIAGNOSIS — E66812 Obesity, class 2: Secondary | ICD-10-CM | POA: Diagnosis not present

## 2024-01-10 DIAGNOSIS — I69320 Aphasia following cerebral infarction: Secondary | ICD-10-CM | POA: Diagnosis not present

## 2024-01-10 DIAGNOSIS — F320 Major depressive disorder, single episode, mild: Secondary | ICD-10-CM | POA: Diagnosis not present

## 2024-01-10 DIAGNOSIS — M0589 Other rheumatoid arthritis with rheumatoid factor of multiple sites: Secondary | ICD-10-CM | POA: Diagnosis not present

## 2024-01-10 DIAGNOSIS — I10 Essential (primary) hypertension: Secondary | ICD-10-CM | POA: Diagnosis not present

## 2024-01-10 DIAGNOSIS — R413 Other amnesia: Secondary | ICD-10-CM | POA: Diagnosis not present

## 2024-01-17 DIAGNOSIS — E66812 Obesity, class 2: Secondary | ICD-10-CM | POA: Diagnosis not present

## 2024-01-17 DIAGNOSIS — I10 Essential (primary) hypertension: Secondary | ICD-10-CM | POA: Diagnosis not present

## 2024-01-17 DIAGNOSIS — R413 Other amnesia: Secondary | ICD-10-CM | POA: Diagnosis not present

## 2024-01-17 DIAGNOSIS — M0589 Other rheumatoid arthritis with rheumatoid factor of multiple sites: Secondary | ICD-10-CM | POA: Diagnosis not present

## 2024-01-17 DIAGNOSIS — I69320 Aphasia following cerebral infarction: Secondary | ICD-10-CM | POA: Diagnosis not present

## 2024-01-17 DIAGNOSIS — F320 Major depressive disorder, single episode, mild: Secondary | ICD-10-CM | POA: Diagnosis not present

## 2024-01-17 NOTE — Progress Notes (Unsigned)
 Electrophysiology Office Note:    Date:  01/18/2024   ID:  ALLEIGH Watts, DOB June 09, 1950, MRN [id number]  [facility] Cardiologist:  None  [facility] Electrophysiologist:  None   Referring MD: Valerio Melanie DASEN, NP   Chief Complaint: Cryptogenic stroke  History of Present Illness:    Angela Watts is a 73 year old woman who I am seeing today for an evaluation of cryptogenic stroke at the request of Jolene [name], NP.  The patient has no prior history of atrial fibrillation.  She is being referred to discuss loop recorder implantation.      Their past medical, social and family history was reviewed.   ROS:   Please see the history of present illness.    All other systems reviewed and are negative.  EKGs/Labs/Other Studies Reviewed:    The following studies were reviewed today:  December 14, 2023 ZIO monitor personally reviewed No atrial fibrillation  EKGs reviewed.  No atrial fibrillation      Physical Exam:    VS:  BP 132/60 (BP Location: Left Arm, Patient Position: Sitting, Cuff Size: Large)   Pulse 95   Ht 5' 6 (1.676 m)   Wt 182 lb 8 oz (82.8 kg)   LMP  (LMP Unknown)   SpO2 96%   BMI 29.46 kg/m     Wt Readings from Last 3 Encounters:  01/18/24 182 lb 8 oz (82.8 kg)  12/26/23 183 lb 9.6 oz (83.3 kg)  11/23/23 184 lb (83.5 kg)     GEN: no distress CARD: RRR, No MRG RESP: No IWOB. CTAB.        ASSESSMENT AND PLAN:    1. Cryptogenic stroke [facility])     #Cryptogenic Stroke Pathophysiology of cryptogenic stroke was discussed in detail during today's clinic appointment. I discussed the role of loop recorder monitoring in patients who have suffered a CVA/TIA. There has been no evidence of AF thus far in the patient's evaluation. Loop recorder monitors were discussed in detail including the implant procedure and its risks. I discussed the monthly monitoring costs associated with loop recorder monitoring. The patient would like to proceed with ILR  implant.       Signed, Ole DASEN. Cindie, MD, [facility], [facility] 01/18/2024 4:30 PM    Electrophysiology [facility]  ---------------------------  SURGEON:  OLE DASEN CINDIE, MD     PREPROCEDURE DIAGNOSIS:  Cryptogenic stroke    POSTPROCEDURE DIAGNOSIS: Cryptogenic stroke     PROCEDURES:   1. Implantable loop recorder implantation    INTRODUCTION:  Angela Watts presents with a history of cryptogenic stroke The costs of loop recorder monitoring have been discussed with the patient.    DESCRIPTION OF PROCEDURE:  Informed written consent was obtained.  A preprocedural timeout was performed with the RN [place]). The patient required no sedation for the procedure today.  Mapping over the patient's chest was performed to identify the area where electrograms were most prominent for ILR recording.  This area was found to be the left parasternal region over the 4th intercostal space. The patients left chest was therefore prepped and draped in the usual sterile fashion. The skin overlying the left parasternal region was infiltrated with lidocaine  for local analgesia.  A 0.5-cm incision was made over the left parasternal region over the 3rd intercostal space.  A subcutaneous ILR pocket was fashioned using a combination of sharp and blunt dissection.  A Medtronic Reveal LINQ [id number]) implantable loop recorder was then placed into the pocket  R waves were very prominent and measured >0.46mV.  Steri- Strips and a sterile dressing were then applied.  There were no early apparent complications.     CONCLUSIONS:   1. Successful implantation of a implantable loop recorder for Cryptogenic stroke  2. No early apparent complications.   Ole T. Cindie, MD, [facility], [facility] Cardiac Electrophysiology

## 2024-01-18 ENCOUNTER — Encounter: Payer: Self-pay | Admitting: Cardiology

## 2024-01-18 ENCOUNTER — Ambulatory Visit: Attending: Cardiology | Admitting: Cardiology

## 2024-01-18 VITALS — BP 132/60 | HR 95 | Ht 66.0 in | Wt 182.5 lb

## 2024-01-18 DIAGNOSIS — I639 Cerebral infarction, unspecified: Secondary | ICD-10-CM | POA: Insufficient documentation

## 2024-01-18 NOTE — Patient Instructions (Addendum)
Medication Instructions:  Your physician recommends that you continue on your current medications as directed. Please refer to the Current Medication list given to you today.  Labwork: None ordered.  Testing/Procedures: None ordered.  Follow-Up:  As needed with Dr. Lalla Brothers  Implantable Loop Recorder Placement, Care After This sheet gives you information about how to care for yourself after your procedure. Your health care provider may also give you more specific instructions. If you have problems or questions, contact your health care provider. What can I expect after the procedure? After the procedure, it is common to have: Soreness or discomfort near the incision. Some swelling or bruising near the incision.  Follow these instructions at home: Incision care  Monitor your cardiac device site for redness, swelling, and drainage. Call the device clinic at [phone] if you experience these symptoms or fever/chills.  Keep the large square bandage on your site for 24 hours and then you may remove it yourself. Keep the steri-strips underneath in place.   You may shower after 72 hours / 3 days from your procedure with the steri-strips in place. They will usually fall off on their own, or may be removed after 10 days. Pat dry.   Avoid lotions, ointments, or perfumes over your incision until it is well-healed.  Please do not submerge in water until your site is completely healed.   Your device is MRI compatible.   Remote monitoring is used to monitor your cardiac device from home. This monitoring is scheduled every month by our office. It allows [place] to keep an eye on the function of your device to ensure it is working properly.  If your wound site starts to bleed apply pressure.    For help with the monitor please call Medtronic Monitor Support Specialist directly at [phone].    If you have any questions/concerns please call the device clinic at  [phone].  Activity  Return to your normal activities.  General instructions Follow instructions from your health care provider about how to manage your implantable loop recorder and transmit the information. Learn how to activate a recording if this is necessary for your type of device. You may go through a metal detection gate, and you may let someone hold a metal detector over your chest. Show your ID card if needed. Do not have an MRI unless you check with your health care provider first. Take over-the-counter and prescription medicines only as told by your health care provider. Keep all follow-up visits as told by your health care provider. This is important. Contact a health care provider if: You have redness, swelling, or pain around your incision. You have a fever. You have pain that is not relieved by your pain medicine. You have triggered your device because of fainting (syncope) or because of a heartbeat that feels like it is racing, slow, fluttering, or skipping (palpitations). Get help right away if you have: Chest pain. Difficulty breathing. Summary After the procedure, it is common to have soreness or discomfort near the incision. Change your dressing as told by your health care provider. Follow instructions from your health care provider about how to manage your implantable loop recorder and transmit the information. Keep all follow-up visits as told by your health care provider. This is important. This information is not intended to replace advice given to you by your health care provider. Make sure you discuss any questions you have with your health care provider. Document Released: 04/14/2015 Document Revised: 06/18/2017 Document Reviewed: 06/18/2017 Elsevier Patient  Education  2020 [employer]  

## 2024-01-23 DIAGNOSIS — F320 Major depressive disorder, single episode, mild: Secondary | ICD-10-CM | POA: Diagnosis not present

## 2024-01-23 DIAGNOSIS — E782 Mixed hyperlipidemia: Secondary | ICD-10-CM | POA: Diagnosis not present

## 2024-01-23 DIAGNOSIS — E66812 Obesity, class 2: Secondary | ICD-10-CM | POA: Diagnosis not present

## 2024-01-23 DIAGNOSIS — I69320 Aphasia following cerebral infarction: Secondary | ICD-10-CM | POA: Diagnosis not present

## 2024-01-23 DIAGNOSIS — I10 Essential (primary) hypertension: Secondary | ICD-10-CM | POA: Diagnosis not present

## 2024-01-23 DIAGNOSIS — R413 Other amnesia: Secondary | ICD-10-CM | POA: Diagnosis not present

## 2024-01-23 DIAGNOSIS — M0589 Other rheumatoid arthritis with rheumatoid factor of multiple sites: Secondary | ICD-10-CM | POA: Diagnosis not present

## 2024-01-23 DIAGNOSIS — Z6828 Body mass index (BMI) 28.0-28.9, adult: Secondary | ICD-10-CM | POA: Diagnosis not present

## 2024-01-24 DIAGNOSIS — I10 Essential (primary) hypertension: Secondary | ICD-10-CM | POA: Diagnosis not present

## 2024-01-24 DIAGNOSIS — E66812 Obesity, class 2: Secondary | ICD-10-CM | POA: Diagnosis not present

## 2024-01-24 DIAGNOSIS — I69320 Aphasia following cerebral infarction: Secondary | ICD-10-CM | POA: Diagnosis not present

## 2024-01-24 DIAGNOSIS — M0589 Other rheumatoid arthritis with rheumatoid factor of multiple sites: Secondary | ICD-10-CM | POA: Diagnosis not present

## 2024-01-24 DIAGNOSIS — R413 Other amnesia: Secondary | ICD-10-CM | POA: Diagnosis not present

## 2024-01-24 DIAGNOSIS — F320 Major depressive disorder, single episode, mild: Secondary | ICD-10-CM | POA: Diagnosis not present

## 2024-01-25 ENCOUNTER — Ambulatory Visit
Admission: RE | Admit: 2024-01-25 | Discharge: 2024-01-25 | Disposition: A | Discharge status: Home / Self Care | Source: Ambulatory Visit | Attending: Nurse Practitioner | Admitting: Nurse Practitioner

## 2024-01-25 DIAGNOSIS — Z1231 Encounter for screening mammogram for malignant neoplasm of breast: Secondary | ICD-10-CM | POA: Insufficient documentation

## 2024-01-29 DIAGNOSIS — F320 Major depressive disorder, single episode, mild: Secondary | ICD-10-CM | POA: Diagnosis not present

## 2024-01-29 DIAGNOSIS — M0589 Other rheumatoid arthritis with rheumatoid factor of multiple sites: Secondary | ICD-10-CM | POA: Diagnosis not present

## 2024-01-29 DIAGNOSIS — Z6828 Body mass index (BMI) 28.0-28.9, adult: Secondary | ICD-10-CM | POA: Diagnosis not present

## 2024-01-29 DIAGNOSIS — E66812 Obesity, class 2: Secondary | ICD-10-CM | POA: Diagnosis not present

## 2024-01-29 DIAGNOSIS — R413 Other amnesia: Secondary | ICD-10-CM | POA: Diagnosis not present

## 2024-01-29 DIAGNOSIS — I10 Essential (primary) hypertension: Secondary | ICD-10-CM | POA: Diagnosis not present

## 2024-01-29 DIAGNOSIS — I69320 Aphasia following cerebral infarction: Secondary | ICD-10-CM | POA: Diagnosis not present

## 2024-01-29 DIAGNOSIS — E782 Mixed hyperlipidemia: Secondary | ICD-10-CM | POA: Diagnosis not present

## 2024-02-08 DIAGNOSIS — I10 Essential (primary) hypertension: Secondary | ICD-10-CM | POA: Diagnosis not present

## 2024-02-08 DIAGNOSIS — F320 Major depressive disorder, single episode, mild: Secondary | ICD-10-CM | POA: Diagnosis not present

## 2024-02-08 DIAGNOSIS — I69320 Aphasia following cerebral infarction: Secondary | ICD-10-CM | POA: Diagnosis not present

## 2024-02-08 DIAGNOSIS — E66812 Obesity, class 2: Secondary | ICD-10-CM | POA: Diagnosis not present

## 2024-02-08 DIAGNOSIS — R413 Other amnesia: Secondary | ICD-10-CM | POA: Diagnosis not present

## 2024-02-08 DIAGNOSIS — M0589 Other rheumatoid arthritis with rheumatoid factor of multiple sites: Secondary | ICD-10-CM | POA: Diagnosis not present

## 2024-02-14 ENCOUNTER — Institutional Professional Consult (permissible substitution): Admitting: Cardiology

## 2024-02-15 DIAGNOSIS — Z8673 Personal history of transient ischemic attack (TIA), and cerebral infarction without residual deficits: Secondary | ICD-10-CM | POA: Diagnosis not present

## 2024-02-15 DIAGNOSIS — G4733 Obstructive sleep apnea (adult) (pediatric): Secondary | ICD-10-CM | POA: Diagnosis not present

## 2024-02-15 DIAGNOSIS — R413 Other amnesia: Secondary | ICD-10-CM | POA: Diagnosis not present

## 2024-02-20 ENCOUNTER — Ambulatory Visit

## 2024-02-20 DIAGNOSIS — I639 Cerebral infarction, unspecified: Secondary | ICD-10-CM | POA: Diagnosis not present

## 2024-02-20 LAB — CUP PACEART REMOTE DEVICE CHECK
Date Time Interrogation Session: 20251004233237
Implantable Pulse Generator Implant Date: 20250903

## 2024-02-21 DIAGNOSIS — R413 Other amnesia: Secondary | ICD-10-CM | POA: Diagnosis not present

## 2024-02-21 DIAGNOSIS — I10 Essential (primary) hypertension: Secondary | ICD-10-CM | POA: Diagnosis not present

## 2024-02-21 DIAGNOSIS — I69320 Aphasia following cerebral infarction: Secondary | ICD-10-CM | POA: Diagnosis not present

## 2024-02-21 DIAGNOSIS — F320 Major depressive disorder, single episode, mild: Secondary | ICD-10-CM | POA: Diagnosis not present

## 2024-02-21 DIAGNOSIS — E66812 Obesity, class 2: Secondary | ICD-10-CM | POA: Diagnosis not present

## 2024-02-21 DIAGNOSIS — M0589 Other rheumatoid arthritis with rheumatoid factor of multiple sites: Secondary | ICD-10-CM | POA: Diagnosis not present

## 2024-02-21 NOTE — Progress Notes (Signed)
 Remote Loop Recorder Transmission

## 2024-02-22 ENCOUNTER — Ambulatory Visit: Payer: Self-pay | Admitting: Cardiology

## 2024-02-24 DIAGNOSIS — R413 Other amnesia: Secondary | ICD-10-CM | POA: Diagnosis not present

## 2024-02-24 DIAGNOSIS — F320 Major depressive disorder, single episode, mild: Secondary | ICD-10-CM | POA: Diagnosis not present

## 2024-02-24 DIAGNOSIS — I69320 Aphasia following cerebral infarction: Secondary | ICD-10-CM | POA: Diagnosis not present

## 2024-02-24 DIAGNOSIS — M0589 Other rheumatoid arthritis with rheumatoid factor of multiple sites: Secondary | ICD-10-CM | POA: Diagnosis not present

## 2024-02-24 DIAGNOSIS — E66812 Obesity, class 2: Secondary | ICD-10-CM | POA: Diagnosis not present

## 2024-02-24 DIAGNOSIS — I10 Essential (primary) hypertension: Secondary | ICD-10-CM | POA: Diagnosis not present

## 2024-02-25 DIAGNOSIS — Z95818 Presence of other cardiac implants and grafts: Secondary | ICD-10-CM | POA: Insufficient documentation

## 2024-02-25 NOTE — Patient Instructions (Incomplete)
 Be Involved in Caring For Your Health:  Taking Medications When medications are taken as directed, they can greatly improve your health. But if they are not taken as prescribed, they may not work. In some cases, not taking them correctly can be harmful. To help ensure your treatment remains effective and safe, understand your medications and how to take them. Bring your medications to each visit for review by your provider.  Your lab results, notes, and after visit summary will be available on My Chart. We strongly encourage you to use this feature. If lab results are abnormal the clinic will contact you with the appropriate steps. If the clinic does not contact you assume the results are satisfactory. You can always view your results on My Chart. If you have questions regarding your health or results, please contact the clinic during office hours. You can also ask questions on My Chart.  We at [facility] are grateful that you chose us  to provide your care. We strive to provide evidence-based and compassionate care and are always looking for feedback. If you get a survey from the clinic please complete this so we can hear your opinions.   Heart-Healthy Eating Plan Many factors influence your heart health, including eating and exercise habits. Heart health is also called coronary health. Coronary risk increases with abnormal blood fat (lipid) levels. A heart-healthy eating plan includes limiting unhealthy fats, increasing healthy fats, limiting salt (sodium) intake, and making other diet and lifestyle changes. What is my plan? Your health care provider may recommend that: You limit your fat intake to _________% or less of your total calories each day. You limit your saturated fat intake to _________% or less of your total calories each day. You limit the amount of cholesterol in your diet to less than _________ mg per day. You limit the amount of sodium in your diet to less than  _________ mg per day. What are tips for following this plan? Cooking Cook foods using methods other than frying. Baking, boiling, grilling, and broiling are all good options. Other ways to reduce fat include: Removing the skin from poultry. Removing all visible fats from meats. Steaming vegetables in water or broth. Meal planning  At meals, imagine dividing your plate into fourths: Fill one-half of your plate with vegetables and green salads. Fill one-fourth of your plate with whole grains. Fill one-fourth of your plate with lean protein foods. Eat 2-4 cups of vegetables per day. One cup of vegetables equals 1 cup (91 g) broccoli or cauliflower florets, 2 medium carrots, 1 large bell pepper, 1 large sweet potato, 1 large tomato, 1 medium white potato, 2 cups (150 g) raw leafy greens. Eat 1-2 cups of fruit per day. One cup of fruit equals 1 small apple, 1 large banana, 1 cup (237 g) mixed fruit, 1 large orange,  cup (82 g) dried fruit, 1 cup (240 mL) 100% fruit juice. Eat more foods that contain soluble fiber. Examples include apples, broccoli, carrots, beans, peas, and barley. Aim to get 25-30 g of fiber per day. Increase your consumption of legumes, nuts, and seeds to 4-5 servings per week. One serving of dried beans or legumes equals  cup (90 g) cooked, 1 serving of nuts is  oz (12 almonds, 24 pistachios, or 7 walnut halves), and 1 serving of seeds equals  oz (8 g). Fats Choose healthy fats more often. Choose monounsaturated and polyunsaturated fats, such as olive and canola oils, avocado oil, flaxseeds, walnuts, almonds, and seeds.  Eat more omega-3 fats. Choose salmon, mackerel, sardines, tuna, flaxseed oil, and ground flaxseeds. Aim to eat fish at least 2 times each week. Check food labels carefully to identify foods with trans fats or high amounts of saturated fat. Limit saturated fats. These are found in animal products, such as meats, butter, and cream. Plant sources of saturated  fats include palm oil, palm kernel oil, and coconut oil. Avoid foods with partially hydrogenated oils in them. These contain trans fats. Examples are stick margarine, some tub margarines, cookies, crackers, and other baked goods. Avoid fried foods. General information Eat more home-cooked food and less restaurant, buffet, and fast food. Limit or avoid alcohol. Limit foods that are high in added sugar and simple starches such as foods made using white refined flour (white breads, pastries, sweets). Lose weight if you are overweight. Losing just 5-10% of your body weight can help your overall health and prevent diseases such as diabetes and heart disease. Monitor your sodium intake, especially if you have high blood pressure. Talk with your health care provider about your sodium intake. Try to incorporate more vegetarian meals weekly. What foods should I eat? Fruits All fresh, canned (in natural juice), or frozen fruits. Vegetables Fresh or frozen vegetables (raw, steamed, roasted, or grilled). Green salads. Grains Most grains. Choose whole wheat and whole grains most of the time. Rice and pasta, including brown rice and pastas made with whole wheat. Meats and other proteins Lean, well-trimmed beef, veal, pork, and lamb. Chicken and [place] without skin. All fish and shellfish. Wild duck, rabbit, pheasant, and venison. Egg whites or low-cholesterol egg substitutes. Dried beans, peas, lentils, and tofu. Seeds and most nuts. Dairy Low-fat or nonfat cheeses, including ricotta and mozzarella. Skim or 1% milk (liquid, powdered, or evaporated). Buttermilk made with low-fat milk. Nonfat or low-fat yogurt. Fats and oils Non-hydrogenated (trans-free) margarines. Vegetable oils, including soybean, sesame, sunflower, olive, avocado, peanut, safflower, corn, canola, and cottonseed. Salad dressings or mayonnaise made with a vegetable oil. Beverages Water (mineral or sparkling). Coffee and tea. Unsweetened  ice tea. Diet beverages. Sweets and desserts Sherbet, gelatin, and fruit ice. Small amounts of dark chocolate. Limit all sweets and desserts. Seasonings and condiments All seasonings and condiments. The items listed above may not be a complete list of foods and beverages you can eat. Contact a dietitian for more options. What foods should I avoid? Fruits Canned fruit in heavy syrup. Fruit in cream or butter sauce. Fried fruit. Limit coconut. Vegetables Vegetables cooked in cheese, cream, or butter sauce. Fried vegetables. Grains Breads made with saturated or trans fats, oils, or whole milk. Croissants. Sweet rolls. Donuts. High-fat crackers, such as cheese crackers and chips. Meats and other proteins Fatty meats, such as hot dogs, ribs, sausage, bacon, rib-eye roast or steak. High-fat deli meats, such as salami and bologna. Caviar. Domestic duck and goose. Organ meats, such as liver. Dairy Cream, sour cream, cream cheese, and creamed cottage cheese. Whole-milk cheeses. Whole or 2% milk (liquid, evaporated, or condensed). Whole buttermilk. Cream sauce or high-fat cheese sauce. Whole-milk yogurt. Fats and oils Meat fat, or shortening. Cocoa butter, hydrogenated oils, palm oil, coconut oil, palm kernel oil. Solid fats and shortenings, including bacon fat, salt pork, lard, and butter. Nondairy cream substitutes. Salad dressings with cheese or sour cream. Beverages Regular sodas and any drinks with added sugar. Sweets and desserts Frosting. Pudding. Cookies. Cakes. Pies. Milk chocolate or white chocolate. Buttered syrups. Full-fat ice cream or ice cream drinks. The items listed above  may not be a complete list of foods and beverages to avoid. Contact a dietitian for more information. Summary Heart-healthy meal planning includes limiting unhealthy fats, increasing healthy fats, limiting salt (sodium) intake and making other diet and lifestyle changes. Lose weight if you are overweight. Losing  just 5-10% of your body weight can help your overall health and prevent diseases such as diabetes and heart disease. Focus on eating a balance of foods, including fruits and vegetables, low-fat or nonfat dairy, lean protein, nuts and legumes, whole grains, and heart-healthy oils and fats. This information is not intended to replace advice given to you by your health care provider. Make sure you discuss any questions you have with your health care provider. Document Revised: 06/08/2021 Document Reviewed: 06/08/2021 Elsevier Patient Education  2024 [employer].

## 2024-02-28 ENCOUNTER — Ambulatory Visit (INDEPENDENT_AMBULATORY_CARE_PROVIDER_SITE_OTHER): Admitting: Nurse Practitioner

## 2024-02-28 ENCOUNTER — Encounter: Payer: Self-pay | Admitting: Nurse Practitioner

## 2024-02-28 VITALS — BP 118/66 | HR 79 | Temp 98.1°F | Resp 16 | Ht 65.98 in | Wt 177.0 lb

## 2024-02-28 DIAGNOSIS — J301 Allergic rhinitis due to pollen: Secondary | ICD-10-CM

## 2024-02-28 DIAGNOSIS — Z95818 Presence of other cardiac implants and grafts: Secondary | ICD-10-CM

## 2024-02-28 DIAGNOSIS — I10 Essential (primary) hypertension: Secondary | ICD-10-CM

## 2024-02-28 DIAGNOSIS — E782 Mixed hyperlipidemia: Secondary | ICD-10-CM

## 2024-02-28 DIAGNOSIS — R413 Other amnesia: Secondary | ICD-10-CM | POA: Diagnosis not present

## 2024-02-28 DIAGNOSIS — Z8673 Personal history of transient ischemic attack (TIA), and cerebral infarction without residual deficits: Secondary | ICD-10-CM | POA: Diagnosis not present

## 2024-02-28 DIAGNOSIS — Z6828 Body mass index (BMI) 28.0-28.9, adult: Secondary | ICD-10-CM | POA: Diagnosis not present

## 2024-02-28 DIAGNOSIS — I69320 Aphasia following cerebral infarction: Secondary | ICD-10-CM | POA: Diagnosis not present

## 2024-02-28 DIAGNOSIS — Z23 Encounter for immunization: Secondary | ICD-10-CM | POA: Diagnosis not present

## 2024-02-28 DIAGNOSIS — Z1231 Encounter for screening mammogram for malignant neoplasm of breast: Secondary | ICD-10-CM

## 2024-02-28 DIAGNOSIS — E66812 Obesity, class 2: Secondary | ICD-10-CM | POA: Diagnosis not present

## 2024-02-28 DIAGNOSIS — F320 Major depressive disorder, single episode, mild: Secondary | ICD-10-CM | POA: Diagnosis not present

## 2024-02-28 DIAGNOSIS — M0589 Other rheumatoid arthritis with rheumatoid factor of multiple sites: Secondary | ICD-10-CM | POA: Diagnosis not present

## 2024-02-28 MED ORDER — FLUTICASONE PROPIONATE 50 MCG/ACT NA SUSP: 2.0000 | Freq: Every day | NASAL | 6 refills | Status: AC

## 2024-02-28 NOTE — Assessment & Plan Note (Signed)
 Chronic, stable.  BP at goal in office today. Will continue Amlodipine  5 MG daily and if elevations noted in future will increase to 10 MG.  Remain off HCTZ due to urinary urgency with this in past.  LABS: up to date.  Focus on DASH diet and regular exercise at home.  Monitor BP daily.

## 2024-02-28 NOTE — Progress Notes (Signed)
 BP 118/66 (BP Location: Left Arm, Patient Position: Sitting, Cuff Size: Normal)   Pulse 79   Temp 98.1 F (36.7 C) (Oral)   Resp 16   Ht 5' 5.98 (1.676 m)   Wt 177 lb (80.3 kg)   LMP  (LMP Unknown)   SpO2 97%   BMI 28.58 kg/m    Subjective:    Patient ID: Angela Watts, female    DOB: 09-02-1950, 73 y.o.   MRN: [id number]  HPI: Angela Watts is a 73 y.o. female  Chief Complaint  Patient presents with   Sinus Problem    Been awhile but worst this week. Post nasal drip and some sore throat in the morning. No allergy medication right now. Using Afrin OTC right now.    Pacemaker Check    Gets tired some but nothing major.    UPPER RESPIRATORY TRACT INFECTION Symptoms started off and on the whole fall, but is getting worse.  Using Afrin only at this time. Fever: no Cough: no Shortness of breath: no Wheezing: no Chest pain: no Chest tightness: no Chest congestion: no Nasal congestion: yes Runny nose: yes Post nasal drip: yes Sneezing: every once and awhile Sore throat: occasional in morning  Swollen glands: no Sinus pressure: occasional Headache: no Face pain: no Toothache: no Ear pain: none Ear pressure: none Eyes red/itching: yes Eye drainage/crusting: no  Vomiting: no Rash: no Fatigue: yes Sick contacts: no Strep contacts: no  Context: stable Recurrent sinusitis: no Relief with OTC cold/cough medications: no  Treatments attempted: Afrin    HYPERTENSION without Chronic Kidney Disease Had loop recorder placed on 02/20/24, feeling tired but overall doing well. Follow-up with cardiology last on 01/18/24. Taking Amlodipine . Hypertension status: stable  Satisfied with current treatment? yes Duration of hypertension: chronic BP monitoring frequency:  not checking BP range:  BP medication side effects:  no Medication compliance: good compliance Aspirin : yes Recurrent headaches: no Visual changes: no Palpitations: no Dyspnea: no Chest pain:  no Lower extremity edema: no Dizzy/lightheaded: no   Relevant past medical, surgical, family and social history reviewed and updated as indicated. Interim medical history since our last visit reviewed. Allergies and medications reviewed and updated.  Review of Systems  Constitutional:  Positive for fatigue. Negative for activity change, appetite change, chills and fever.  HENT:  Positive for congestion, postnasal drip, rhinorrhea, sinus pressure (occasional) and sore throat (in morning only). Negative for ear discharge, ear pain, facial swelling, sinus pain, sneezing and voice change.   Eyes:  Positive for itching.  Respiratory:  Negative for cough, chest tightness, shortness of breath and wheezing.   Cardiovascular:  Negative for chest pain, palpitations and leg swelling.  Gastrointestinal: Negative.   Neurological: Negative.   Psychiatric/Behavioral: Negative.      Per HPI unless specifically indicated above     Objective:    BP 118/66 (BP Location: Left Arm, Patient Position: Sitting, Cuff Size: Normal)   Pulse 79   Temp 98.1 F (36.7 C) (Oral)   Resp 16   Ht 5' 5.98 (1.676 m)   Wt 177 lb (80.3 kg)   LMP  (LMP Unknown)   SpO2 97%   BMI 28.58 kg/m   Wt Readings from Last 3 Encounters:  02/28/24 177 lb (80.3 kg)  01/18/24 182 lb 8 oz (82.8 kg)  12/26/23 183 lb 9.6 oz (83.3 kg)    Physical Exam Vitals and nursing note reviewed.  Constitutional:      General: She is awake. She is  not in acute distress.    Appearance: She is well-developed and well-groomed. She is not ill-appearing or toxic-appearing.  HENT:     Head: Normocephalic.     Right Ear: Hearing, ear canal and external ear normal. A middle ear effusion is present. There is no impacted cerumen. Tympanic membrane is not injected.     Left Ear: Hearing, ear canal and external ear normal. A middle ear effusion is present. There is no impacted cerumen. Tympanic membrane is not injected.     Nose: Rhinorrhea  present. Rhinorrhea is clear.     Right Sinus: No maxillary sinus tenderness or frontal sinus tenderness.     Left Sinus: No maxillary sinus tenderness or frontal sinus tenderness.     Mouth/Throat:     Mouth: Mucous membranes are moist.     Pharynx: Posterior oropharyngeal erythema (mild, cobblestone appearance) and postnasal drip present. No pharyngeal swelling or oropharyngeal exudate.  Eyes:     General: Lids are normal.        Right eye: No discharge.        Left eye: No discharge.     Conjunctiva/sclera: Conjunctivae normal.     Pupils: Pupils are equal, round, and reactive to light.  Neck:     Thyroid : No thyromegaly.     Vascular: No carotid bruit.  Cardiovascular:     Rate and Rhythm: Normal rate and regular rhythm.     Heart sounds: Normal heart sounds. No murmur heard.    No gallop.  Pulmonary:     Effort: Pulmonary effort is normal. No accessory muscle usage or respiratory distress.     Breath sounds: Normal breath sounds.  Abdominal:     General: Bowel sounds are normal. There is no distension.     Palpations: Abdomen is soft.     Tenderness: There is no abdominal tenderness.  Musculoskeletal:     Cervical back: Normal range of motion and neck supple.     Right lower leg: No edema.     Left lower leg: No edema.  Lymphadenopathy:     Cervical: No cervical adenopathy.  Skin:    General: Skin is warm and dry.  Neurological:     Mental Status: She is alert and oriented to person, place, and time.     Deep Tendon Reflexes: Reflexes are normal and symmetric.     Reflex Scores:      Brachioradialis reflexes are 2+ on the right side and 2+ on the left side.      Patellar reflexes are 2+ on the right side and 2+ on the left side. Psychiatric:        Attention and Perception: Attention normal.        Mood and Affect: Mood normal.        Speech: Speech normal.        Behavior: Behavior normal. Behavior is cooperative.        Thought Content: Thought content normal.     Results for orders placed or performed in visit on 02/20/24  CUP PACEART REMOTE DEVICE CHECK   Collection Time: 02/18/24 11:32 PM  Result Value Ref Range   Date Time Interrogation Session [id number]    Pulse Generator Manufacturer [employer]    Pulse Gen Model LNQ22 LINQ II    Pulse Gen Serial Number [id number]    Clinic Name [facility]    Implantable Pulse Generator Type ICM/ILR    Implantable Pulse Generator Implant Date [id number]  Assessment & Plan:   Problem List Items Addressed This Visit       Cardiovascular and Mediastinum   Hypertension - Primary   Chronic, stable.  BP at goal in office today. Will continue Amlodipine  5 MG daily and if elevations noted in future will increase to 10 MG.  Remain off HCTZ due to urinary urgency with this in past.  LABS: up to date.  Focus on DASH diet and regular exercise at home.  Monitor BP daily.          Respiratory   Allergic rhinitis   Chronic, with worsening over past month. Recommend she stop Afrin, has only taken for a few weeks. Start Claritin or Zyrtec daily, per dosing on box. Avoid D medications.  Flonase sent in to use daily, educated on this plan of care.  Attempt to avoid triggers.        Other   Implantable loop recorder present   Placed 02/20/24 and overall tolerating well. Denies any concerns with this. Continue to collaborate with cardiology.      History of stroke in adulthood   Stable at this time with reassuring neuro exam.  Continue all medications as ordered.  Educated her on symptoms to immediately go to ER for.  Goals A1c < 6.5%, LDL <55, and BP <130/80.  Loop recorder in place.      Other Visit Diagnoses       Encounter for screening mammogram for malignant neoplasm of breast       Breast cancer screening.     Flu vaccine need       Flu vaccine today, educated patient.   Relevant Orders   Flu vaccine HIGH DOSE PF(Fluzone Trivalent) (Completed)     Need for COVID-19 vaccine       Covid 19 in  office today, educated patient.   Relevant Orders   Pfizer Comirnaty Covid -19 Vaccine 11yrs and older (Completed)        Follow up plan: Return for as scheduled December 12th.

## 2024-02-28 NOTE — Assessment & Plan Note (Signed)
 Placed 02/20/24 and overall tolerating well. Denies any concerns with this. Continue to collaborate with cardiology.

## 2024-02-28 NOTE — Assessment & Plan Note (Signed)
 Stable at this time with reassuring neuro exam.  Continue all medications as ordered.  Educated her on symptoms to immediately go to ER for.  Goals A1c < 6.5%, LDL <55, and BP <130/80.  Loop recorder in place.

## 2024-02-28 NOTE — Assessment & Plan Note (Signed)
 Chronic, with worsening over past month. Recommend she stop Afrin, has only taken for a few weeks. Start Claritin or Zyrtec daily, per dosing on box. Avoid D medications.  Flonase sent in to use daily, educated on this plan of care.  Attempt to avoid triggers.

## 2024-03-06 DIAGNOSIS — R413 Other amnesia: Secondary | ICD-10-CM | POA: Diagnosis not present

## 2024-03-06 DIAGNOSIS — F320 Major depressive disorder, single episode, mild: Secondary | ICD-10-CM | POA: Diagnosis not present

## 2024-03-06 DIAGNOSIS — I10 Essential (primary) hypertension: Secondary | ICD-10-CM | POA: Diagnosis not present

## 2024-03-06 DIAGNOSIS — M0589 Other rheumatoid arthritis with rheumatoid factor of multiple sites: Secondary | ICD-10-CM | POA: Diagnosis not present

## 2024-03-06 DIAGNOSIS — E782 Mixed hyperlipidemia: Secondary | ICD-10-CM | POA: Diagnosis not present

## 2024-03-06 DIAGNOSIS — I69320 Aphasia following cerebral infarction: Secondary | ICD-10-CM | POA: Diagnosis not present

## 2024-03-07 ENCOUNTER — Other Ambulatory Visit: Payer: Self-pay | Admitting: Nurse Practitioner

## 2024-03-09 NOTE — Telephone Encounter (Signed)
 Requested Prescriptions  Pending Prescriptions Disp Refills   amLODipine  (NORVASC ) 5 MG tablet [Pharmacy Med Name: AMLODIPINE  BESYLATE 5 MG Oral Tablet] 90 tablet 0    Sig: TAKE 1 TABLET EVERY DAY     Cardiovascular: Calcium  Channel Blockers 2 Passed - 03/09/2024  9:41 AM      Passed - Last BP in normal range    BP Readings from Last 1 Encounters:  02/28/24 118/66         Passed - Last Heart Rate in normal range    Pulse Readings from Last 1 Encounters:  02/28/24 79         Passed - Valid encounter within last 6 months    Recent Outpatient Visits           1 week ago Primary hypertension   [place] [facility] [place], Woodside T, NP   2 months ago Rheumatoid arthritis, seropositive (HCC)   [place] [facility] [place], Melanie T, NP   3 months ago History of stroke in adulthood   [place] [facility] [place], Rozel T, NP   4 months ago Aphasia   [place] [facility] Melvin Pao, NP   5 months ago Right flank pain   [place] [facility] [place], Melanie DASEN, NP

## 2024-03-20 DIAGNOSIS — R413 Other amnesia: Secondary | ICD-10-CM | POA: Diagnosis not present

## 2024-03-20 DIAGNOSIS — I10 Essential (primary) hypertension: Secondary | ICD-10-CM | POA: Diagnosis not present

## 2024-03-20 DIAGNOSIS — M0589 Other rheumatoid arthritis with rheumatoid factor of multiple sites: Secondary | ICD-10-CM | POA: Diagnosis not present

## 2024-03-20 DIAGNOSIS — I69320 Aphasia following cerebral infarction: Secondary | ICD-10-CM | POA: Diagnosis not present

## 2024-03-20 DIAGNOSIS — F320 Major depressive disorder, single episode, mild: Secondary | ICD-10-CM | POA: Diagnosis not present

## 2024-03-20 DIAGNOSIS — E782 Mixed hyperlipidemia: Secondary | ICD-10-CM | POA: Diagnosis not present

## 2024-03-22 ENCOUNTER — Ambulatory Visit

## 2024-03-22 DIAGNOSIS — I639 Cerebral infarction, unspecified: Secondary | ICD-10-CM

## 2024-03-22 LAB — CUP PACEART REMOTE DEVICE CHECK
Date Time Interrogation Session: 20251106000900
Implantable Pulse Generator Implant Date: 20250903

## 2024-03-23 ENCOUNTER — Ambulatory Visit: Payer: Self-pay | Admitting: Cardiology

## 2024-03-23 NOTE — Progress Notes (Signed)
 Remote Loop Recorder Transmission

## 2024-03-27 DIAGNOSIS — F320 Major depressive disorder, single episode, mild: Secondary | ICD-10-CM | POA: Diagnosis not present

## 2024-03-27 DIAGNOSIS — I10 Essential (primary) hypertension: Secondary | ICD-10-CM | POA: Diagnosis not present

## 2024-03-27 DIAGNOSIS — E782 Mixed hyperlipidemia: Secondary | ICD-10-CM | POA: Diagnosis not present

## 2024-03-27 DIAGNOSIS — I69320 Aphasia following cerebral infarction: Secondary | ICD-10-CM | POA: Diagnosis not present

## 2024-03-27 DIAGNOSIS — M0589 Other rheumatoid arthritis with rheumatoid factor of multiple sites: Secondary | ICD-10-CM | POA: Diagnosis not present

## 2024-03-27 DIAGNOSIS — R413 Other amnesia: Secondary | ICD-10-CM | POA: Diagnosis not present

## 2024-03-29 DIAGNOSIS — R413 Other amnesia: Secondary | ICD-10-CM | POA: Diagnosis not present

## 2024-03-29 DIAGNOSIS — F320 Major depressive disorder, single episode, mild: Secondary | ICD-10-CM | POA: Diagnosis not present

## 2024-03-29 DIAGNOSIS — E66812 Obesity, class 2: Secondary | ICD-10-CM | POA: Diagnosis not present

## 2024-03-29 DIAGNOSIS — M0589 Other rheumatoid arthritis with rheumatoid factor of multiple sites: Secondary | ICD-10-CM | POA: Diagnosis not present

## 2024-03-29 DIAGNOSIS — Z6828 Body mass index (BMI) 28.0-28.9, adult: Secondary | ICD-10-CM | POA: Diagnosis not present

## 2024-03-29 DIAGNOSIS — I10 Essential (primary) hypertension: Secondary | ICD-10-CM | POA: Diagnosis not present

## 2024-03-29 DIAGNOSIS — I69320 Aphasia following cerebral infarction: Secondary | ICD-10-CM | POA: Diagnosis not present

## 2024-03-29 DIAGNOSIS — E782 Mixed hyperlipidemia: Secondary | ICD-10-CM | POA: Diagnosis not present

## 2024-04-10 DIAGNOSIS — M0589 Other rheumatoid arthritis with rheumatoid factor of multiple sites: Secondary | ICD-10-CM | POA: Diagnosis not present

## 2024-04-10 DIAGNOSIS — R413 Other amnesia: Secondary | ICD-10-CM | POA: Diagnosis not present

## 2024-04-10 DIAGNOSIS — I10 Essential (primary) hypertension: Secondary | ICD-10-CM | POA: Diagnosis not present

## 2024-04-10 DIAGNOSIS — E782 Mixed hyperlipidemia: Secondary | ICD-10-CM | POA: Diagnosis not present

## 2024-04-10 DIAGNOSIS — I69320 Aphasia following cerebral infarction: Secondary | ICD-10-CM | POA: Diagnosis not present

## 2024-04-10 DIAGNOSIS — F320 Major depressive disorder, single episode, mild: Secondary | ICD-10-CM | POA: Diagnosis not present

## 2024-04-15 NOTE — Patient Instructions (Incomplete)
 Please call to schedule your mammogram and/or bone density: [facility] at [facility]  Address: [address], [place], [place] [address] Phone: [phone]  [facility] [address] [place],  [place]  [address] Phone: [phone]    Be Involved in Caring For Your Health:  Taking Medications When medications are taken as directed, they can greatly improve your health. But if they are not taken as prescribed, they may not work. In some cases, not taking them correctly can be harmful. To help ensure your treatment remains effective and safe, understand your medications and how to take them. Bring your medications to each visit for review by your provider.  Your lab results, notes, and after visit summary will be available on My Chart. We strongly encourage you to use this feature. If lab results are abnormal the clinic will contact you with the appropriate steps. If the clinic does not contact you assume the results are satisfactory. You can always view your results on My Chart. If you have questions regarding your health or results, please contact the clinic during office hours. You can also ask questions on My Chart.  We at [facility] are grateful that you chose us  to provide your care. We strive to provide evidence-based and compassionate care and are always looking for feedback. If you get a survey from the clinic please complete this so we can hear your opinions.   Heart-Healthy Eating Plan Many factors influence your heart health, including eating and exercise habits. Heart health is also called coronary health. Coronary risk increases with abnormal blood fat (lipid) levels. A heart-healthy eating plan includes limiting unhealthy fats, increasing healthy fats, limiting salt (sodium) intake, and making other diet and lifestyle changes. What is my plan? Your health care provider may recommend that: You limit  your fat intake to _________% or less of your total calories each day. You limit your saturated fat intake to _________% or less of your total calories each day. You limit the amount of cholesterol in your diet to less than _________ mg per day. You limit the amount of sodium in your diet to less than _________ mg per day. What are tips for following this plan? Cooking Cook foods using methods other than frying. Baking, boiling, grilling, and broiling are all good options. Other ways to reduce fat include: Removing the skin from poultry. Removing all visible fats from meats. Steaming vegetables in water or broth. Meal planning  At meals, imagine dividing your plate into fourths: Fill one-half of your plate with vegetables and green salads. Fill one-fourth of your plate with whole grains. Fill one-fourth of your plate with lean protein foods. Eat 2-4 cups of vegetables per day. One cup of vegetables equals 1 cup (91 g) broccoli or cauliflower florets, 2 medium carrots, 1 large bell pepper, 1 large sweet potato, 1 large tomato, 1 medium white potato, 2 cups (150 g) raw leafy greens. Eat 1-2 cups of fruit per day. One cup of fruit equals 1 small apple, 1 large banana, 1 cup (237 g) mixed fruit, 1 large orange,  cup (82 g) dried fruit, 1 cup (240 mL) 100% fruit juice. Eat more foods that contain soluble fiber. Examples include apples, broccoli, carrots, beans, peas, and barley. Aim to get 25-30 g of fiber per day. Increase your consumption of legumes, nuts, and seeds to 4-5 servings per week. One serving of dried beans or legumes equals  cup (90  g) cooked, 1 serving of nuts is  oz (12 almonds, 24 pistachios, or 7 walnut halves), and 1 serving of seeds equals  oz (8 g). Fats Choose healthy fats more often. Choose monounsaturated and polyunsaturated fats, such as olive and canola oils, avocado oil, flaxseeds, walnuts, almonds, and seeds. Eat more omega-3 fats. Choose salmon, mackerel,  sardines, tuna, flaxseed oil, and ground flaxseeds. Aim to eat fish at least 2 times each week. Check food labels carefully to identify foods with trans fats or high amounts of saturated fat. Limit saturated fats. These are found in animal products, such as meats, butter, and cream. Plant sources of saturated fats include palm oil, palm kernel oil, and coconut oil. Avoid foods with partially hydrogenated oils in them. These contain trans fats. Examples are stick margarine, some tub margarines, cookies, crackers, and other baked goods. Avoid fried foods. General information Eat more home-cooked food and less restaurant, buffet, and fast food. Limit or avoid alcohol. Limit foods that are high in added sugar and simple starches such as foods made using white refined flour (white breads, pastries, sweets). Lose weight if you are overweight. Losing just 5-10% of your body weight can help your overall health and prevent diseases such as diabetes and heart disease. Monitor your sodium intake, especially if you have high blood pressure. Talk with your health care provider about your sodium intake. Try to incorporate more vegetarian meals weekly. What foods should I eat? Fruits All fresh, canned (in natural juice), or frozen fruits. Vegetables Fresh or frozen vegetables (raw, steamed, roasted, or grilled). Green salads. Grains Most grains. Choose whole wheat and whole grains most of the time. Rice and pasta, including brown rice and pastas made with whole wheat. Meats and other proteins Lean, well-trimmed beef, veal, pork, and lamb. Chicken and turkey without skin. All fish and shellfish. Wild duck, rabbit, pheasant, and venison. Egg whites or low-cholesterol egg substitutes. Dried beans, peas, lentils, and tofu. Seeds and most nuts. Dairy Low-fat or nonfat cheeses, including ricotta and mozzarella. Skim or 1% milk (liquid, powdered, or evaporated). Buttermilk made with low-fat milk. Nonfat or low-fat  yogurt. Fats and oils Non-hydrogenated (trans-free) margarines. Vegetable oils, including soybean, sesame, sunflower, olive, avocado, peanut, safflower, corn, canola, and cottonseed. Salad dressings or mayonnaise made with a vegetable oil. Beverages Water (mineral or sparkling). Coffee and tea. Unsweetened ice tea. Diet beverages. Sweets and desserts Sherbet, gelatin, and fruit ice. Small amounts of dark chocolate. Limit all sweets and desserts. Seasonings and condiments All seasonings and condiments. The items listed above may not be a complete list of foods and beverages you can eat. Contact a dietitian for more options. What foods should I avoid? Fruits Canned fruit in heavy syrup. Fruit in cream or butter sauce. Fried fruit. Limit coconut. Vegetables Vegetables cooked in cheese, cream, or butter sauce. Fried vegetables. Grains Breads made with saturated or trans fats, oils, or whole milk. Croissants. Sweet rolls. Donuts. High-fat crackers, such as cheese crackers and chips. Meats and other proteins Fatty meats, such as hot dogs, ribs, sausage, bacon, rib-eye roast or steak. High-fat deli meats, such as salami and bologna. Caviar. Domestic duck and goose. Organ meats, such as liver. Dairy Cream, sour cream, cream cheese, and creamed cottage cheese. Whole-milk cheeses. Whole or 2% milk (liquid, evaporated, or condensed). Whole buttermilk. Cream sauce or high-fat cheese sauce. Whole-milk yogurt. Fats and oils Meat fat, or shortening. Cocoa butter, hydrogenated oils, palm oil, coconut oil, palm kernel oil. Solid fats and shortenings, including bacon  fat, salt pork, lard, and butter. Nondairy cream substitutes. Salad dressings with cheese or sour cream. Beverages Regular sodas and any drinks with added sugar. Sweets and desserts Frosting. Pudding. Cookies. Cakes. Pies. Milk chocolate or white chocolate. Buttered syrups. Full-fat ice cream or ice cream drinks. The items listed above may not  be a complete list of foods and beverages to avoid. Contact a dietitian for more information. Summary Heart-healthy meal planning includes limiting unhealthy fats, increasing healthy fats, limiting salt (sodium) intake and making other diet and lifestyle changes. Lose weight if you are overweight. Losing just 5-10% of your body weight can help your overall health and prevent diseases such as diabetes and heart disease. Focus on eating a balance of foods, including fruits and vegetables, low-fat or nonfat dairy, lean protein, nuts and legumes, whole grains, and heart-healthy oils and fats. This information is not intended to replace advice given to you by your health care provider. Make sure you discuss any questions you have with your health care provider. Document Revised: 06/08/2021 Document Reviewed: 06/08/2021 Elsevier Patient Education  2024 [employer].

## 2024-04-16 ENCOUNTER — Ambulatory Visit
Admission: RE | Admit: 2024-04-16 | Discharge: 2024-04-16 | Disposition: A | Source: Ambulatory Visit | Attending: Nurse Practitioner | Admitting: Nurse Practitioner

## 2024-04-16 DIAGNOSIS — Z1231 Encounter for screening mammogram for malignant neoplasm of breast: Secondary | ICD-10-CM | POA: Diagnosis not present

## 2024-04-17 ENCOUNTER — Telehealth: Payer: Self-pay | Admitting: Nurse Practitioner

## 2024-04-17 DIAGNOSIS — F320 Major depressive disorder, single episode, mild: Secondary | ICD-10-CM | POA: Diagnosis not present

## 2024-04-17 DIAGNOSIS — M0589 Other rheumatoid arthritis with rheumatoid factor of multiple sites: Secondary | ICD-10-CM | POA: Diagnosis not present

## 2024-04-17 DIAGNOSIS — I69320 Aphasia following cerebral infarction: Secondary | ICD-10-CM | POA: Diagnosis not present

## 2024-04-17 DIAGNOSIS — I10 Essential (primary) hypertension: Secondary | ICD-10-CM | POA: Diagnosis not present

## 2024-04-17 DIAGNOSIS — R413 Other amnesia: Secondary | ICD-10-CM | POA: Diagnosis not present

## 2024-04-17 DIAGNOSIS — E782 Mixed hyperlipidemia: Secondary | ICD-10-CM | POA: Diagnosis not present

## 2024-04-17 NOTE — Telephone Encounter (Unsigned)
 Copied from CRM #[id number]. Topic: General - Other >> Apr 17, 2024  9:06 AM Aleatha BROCKS wrote: Reason for CRM: [facility] cared has faxed over orders that need to be sign, this will be the 3rd time call back # [phone] fax # [phone]

## 2024-04-18 NOTE — Telephone Encounter (Signed)
 Do you remember getting a fax to sign for this?

## 2024-04-18 NOTE — Telephone Encounter (Signed)
 Spoke to print production planner. She will be sending the paperwork to [facility] email and then will route to PCP for signature.

## 2024-04-22 ENCOUNTER — Ambulatory Visit

## 2024-04-22 DIAGNOSIS — I639 Cerebral infarction, unspecified: Secondary | ICD-10-CM | POA: Diagnosis not present

## 2024-04-23 ENCOUNTER — Encounter

## 2024-04-23 LAB — CUP PACEART REMOTE DEVICE CHECK
Date Time Interrogation Session: 20251206232855
Implantable Pulse Generator Implant Date: 20250903

## 2024-04-23 NOTE — Telephone Encounter (Unsigned)
 Copied from CRM #[id number]. Topic: General - Other >> Apr 17, 2024  9:06 AM Aleatha BROCKS wrote: Reason for CRM: [facility] cared has faxed over orders that need to be sign, this will be the 3rd time call back # [phone] fax # [phone] >> Apr 23, 2024  9:49 AM Terri MATSU wrote: Grayce from [facility] calling regarding fax/email she sent for home health ; advised her on the note that was left from [facility]. We are waiting on PCP to sign.

## 2024-04-23 NOTE — Telephone Encounter (Signed)
 Paperwork has been received via email and printed for provider signature and review.

## 2024-04-24 DIAGNOSIS — I69320 Aphasia following cerebral infarction: Secondary | ICD-10-CM | POA: Diagnosis not present

## 2024-04-24 DIAGNOSIS — I10 Essential (primary) hypertension: Secondary | ICD-10-CM | POA: Diagnosis not present

## 2024-04-24 DIAGNOSIS — F320 Major depressive disorder, single episode, mild: Secondary | ICD-10-CM | POA: Diagnosis not present

## 2024-04-24 DIAGNOSIS — E782 Mixed hyperlipidemia: Secondary | ICD-10-CM | POA: Diagnosis not present

## 2024-04-24 DIAGNOSIS — R413 Other amnesia: Secondary | ICD-10-CM | POA: Diagnosis not present

## 2024-04-24 DIAGNOSIS — M0589 Other rheumatoid arthritis with rheumatoid factor of multiple sites: Secondary | ICD-10-CM | POA: Diagnosis not present

## 2024-04-25 DIAGNOSIS — Z6828 Body mass index (BMI) 28.0-28.9, adult: Secondary | ICD-10-CM | POA: Diagnosis not present

## 2024-04-25 DIAGNOSIS — I69320 Aphasia following cerebral infarction: Secondary | ICD-10-CM | POA: Diagnosis not present

## 2024-04-25 DIAGNOSIS — R413 Other amnesia: Secondary | ICD-10-CM | POA: Diagnosis not present

## 2024-04-25 DIAGNOSIS — E66812 Obesity, class 2: Secondary | ICD-10-CM | POA: Diagnosis not present

## 2024-04-25 DIAGNOSIS — E782 Mixed hyperlipidemia: Secondary | ICD-10-CM | POA: Diagnosis not present

## 2024-04-25 DIAGNOSIS — M0589 Other rheumatoid arthritis with rheumatoid factor of multiple sites: Secondary | ICD-10-CM | POA: Diagnosis not present

## 2024-04-25 DIAGNOSIS — I10 Essential (primary) hypertension: Secondary | ICD-10-CM | POA: Diagnosis not present

## 2024-04-25 DIAGNOSIS — F320 Major depressive disorder, single episode, mild: Secondary | ICD-10-CM | POA: Diagnosis not present

## 2024-04-25 NOTE — Telephone Encounter (Signed)
 Paper work completed and faxed back.

## 2024-04-25 NOTE — Telephone Encounter (Unsigned)
 Copied from CRM #[id number]. Topic: Clinical - Home Health Verbal Orders >> Apr 25, 2024  1:33 PM Antwanette L wrote: Caller/Agency: Karen/ [facility] Callback Number: [phone] Service Requested: Speech Therapy Frequency: Amy would like start with an in-person visit next week, followed by telehealth visits the following week and continuing for the remainder of the 9 week period Any new concerns about the patient? Yes. Patient is concerned about her voice due to experiencing intermittent hoarseness.

## 2024-04-26 DIAGNOSIS — M059 Rheumatoid arthritis with rheumatoid factor, unspecified: Secondary | ICD-10-CM | POA: Diagnosis not present

## 2024-04-26 NOTE — Telephone Encounter (Signed)
Called and LVM giving verbal orders per Jolene.  ?

## 2024-04-26 NOTE — Progress Notes (Signed)
 Remote Loop Recorder Transmission

## 2024-04-27 ENCOUNTER — Ambulatory Visit: Admitting: Nurse Practitioner

## 2024-04-27 ENCOUNTER — Encounter: Payer: Self-pay | Admitting: Nurse Practitioner

## 2024-04-27 VITALS — BP 123/64 | HR 73 | Temp 98.3°F | Ht 65.8 in | Wt 176.4 lb

## 2024-04-27 DIAGNOSIS — Z79631 Long term (current) use of antimetabolite agent: Secondary | ICD-10-CM

## 2024-04-27 DIAGNOSIS — E782 Mixed hyperlipidemia: Secondary | ICD-10-CM | POA: Diagnosis not present

## 2024-04-27 DIAGNOSIS — G4733 Obstructive sleep apnea (adult) (pediatric): Secondary | ICD-10-CM | POA: Diagnosis not present

## 2024-04-27 DIAGNOSIS — R748 Abnormal levels of other serum enzymes: Secondary | ICD-10-CM

## 2024-04-27 DIAGNOSIS — Z1231 Encounter for screening mammogram for malignant neoplasm of breast: Secondary | ICD-10-CM

## 2024-04-27 DIAGNOSIS — Z8673 Personal history of transient ischemic attack (TIA), and cerebral infarction without residual deficits: Secondary | ICD-10-CM | POA: Diagnosis not present

## 2024-04-27 DIAGNOSIS — R413 Other amnesia: Secondary | ICD-10-CM

## 2024-04-27 DIAGNOSIS — I10 Essential (primary) hypertension: Secondary | ICD-10-CM

## 2024-04-27 DIAGNOSIS — M059 Rheumatoid arthritis with rheumatoid factor, unspecified: Secondary | ICD-10-CM | POA: Diagnosis not present

## 2024-04-27 DIAGNOSIS — F320 Major depressive disorder, single episode, mild: Secondary | ICD-10-CM

## 2024-04-27 DIAGNOSIS — E66812 Obesity, class 2: Secondary | ICD-10-CM

## 2024-04-27 MED ORDER — AMLODIPINE BESYLATE 5 MG PO TABS: 5.0000 mg | ORAL_TABLET | Freq: Every day | ORAL | 3 refills | Status: AC

## 2024-04-27 MED ORDER — ROSUVASTATIN CALCIUM 20 MG PO TABS: 20.0000 mg | ORAL_TABLET | Freq: Every day | ORAL | 3 refills | Status: AC

## 2024-04-27 MED ORDER — ASPIRIN 81 MG PO TBEC: 81.0000 mg | DELAYED_RELEASE_TABLET | Freq: Every day | ORAL | 3 refills | Status: AC

## 2024-04-27 NOTE — Assessment & Plan Note (Signed)
 Chronic, ongoing.  Continue current medication regimen and adjust as needed.  Lipid panel today. Goal is not to reduce Crestor  dosing, but she is having leg cramps at night. Will trial Magnesium at night to see if helps this.

## 2024-04-27 NOTE — Progress Notes (Signed)
 BP 123/64 (BP Location: Left Arm, Cuff Size: Normal)   Pulse 73   Temp 98.3 F (36.8 C) (Oral)   Ht 5' 5.8 (1.671 m)   Wt 176 lb 6.4 oz (80 kg)   LMP  (LMP Unknown)   SpO2 98%   BMI 28.65 kg/m    Subjective:    Patient ID: Angela Watts, female    DOB: 1950/05/31, 73 y.o.   MRN: [id number]  HPI: Angela Watts is a 73 y.o. female  Chief Complaint  Patient presents with   Hypertension   Hyperlipidemia   Rheumatoid Arthritis   HYPERTENSION / HYPERLIPIDEMIA Continues Amlodipine  and Crestor . History of CVA on 11/08/23. Taking Aricept  for memory changes which were presenting prior to stroke. Saw neurology on 02/15/24 with no changes, discussed could consider increase in Aricept  to 10 MG. She is working with speech therapy due to some speech changes since stroke.  Has known OSA, severe, and is using CPAP nightly. Does report since starting to use CPAP she has had more sinus symptoms, congestion. Last saw cardiology on 01/18/24 and had loop recorder placed 02/20/24.  Satisfied with current treatment? yes Duration of hypertension: chronic BP monitoring frequency: a few times a week BP range: 120 to 130/70-80 BP medication side effects: no Duration of hyperlipidemia: chronic Cholesterol medication side effects: no Cholesterol supplements: none Medication compliance: excellent compliance Aspirin : no Recent stressors: no Recurrent headaches: no Visual changes: no Palpitations: no Dyspnea: occasionally when exerting self more, started after using CPAP and getting loop recorder Chest pain: no Lower extremity edema: no Dizzy/lightheaded: no     06/02/2023   10:13 AM 05/09/2023    9:49 AM 03/28/2020   11:21 AM 03/26/2019   11:12 AM 03/23/2018   10:20 AM  6CIT Screen  What Year? 0 points 0 points 0 points 0 points 0 points  What month? 0 points 0 points 0 points 0 points 0 points  What time? 0 points 0 points 0 points 0 points 0 points  Count back from 20 0 points 0 points  2 points 0 points 0 points  Months in reverse 0 points 0 points 0 points 0 points 0 points  Repeat phrase 0 points 4 points 0 points 0 points 4 points  Total Score 0 points 4 points 2 points 0 points 4 points      07/05/2023    3:12 PM  MMSE - Mini Mental State Exam  Orientation to time 5  Orientation to Place 5  Registration 3  Attention/ Calculation 5  Recall 1  Language- name 2 objects 2  Language- repeat 1  Language- follow 3 step command 3  Language- read & follow direction 1  Write a sentence 1  Copy design 1  Total score 28    RHEUMATOID ARTHRITIS Saw rheumatology last on 04/26/24 with no changes. Labs drawn.  She is to continue Methotrexate  7 tablets (17.5 MG) once a week and daily folic acid . She reports noticing cramps in legs at night time for past 1-2 months. Does not take anything for this, often will go away. If gets to hurting will move her legs. Duration: months Pain: yes Symmetric: yes  mild Quality: aching and throbbing Frequency: intermittent Context:  stable Decreased function/range of motion: no Erythema: no Swelling: no Heat or warmth: no Morning stiffness: yes  Aggravating factors: None Alleviating factors: Improves with movement Relief with NSAIDs?: No NSAIDs Taken Treatments attempted:  none Involved Joints:     Hands: yes, bilateral  Wrists: no     Elbows: no     Shoulders: yes    Back: yes, occasional    Hips: no     Knees: yes bilateral    Ankles: no     Feet: no      04/27/2024    2:48 PM 04/27/2024    2:29 PM 02/28/2024    1:52 PM 12/26/2023   10:36 AM 11/23/2023    8:31 AM  Depression screen PHQ 2/9  Decreased Interest 1 1 1 1  0  Down, Depressed, Hopeless 1 1 1 1  0  PHQ - 2 Score 2 2 2 2  0  Altered sleeping 1 1 1 1 1   Tired, decreased energy 1 1 1 2 1   Change in appetite 0 0 0 0 0  Feeling bad or failure about yourself  0 1 1 0 0  Trouble concentrating 0 1 0 0 1  Moving slowly or fidgety/restless 0 1 1 0 1  Suicidal  thoughts 0 1 0 0 0  PHQ-9 Score 4 8 6  5  4    Difficult doing work/chores  Not difficult at all  Somewhat difficult Not difficult at all     Data saved with a previous flowsheet row definition       04/27/2024    2:27 PM 02/28/2024    1:53 PM 11/23/2023    8:31 AM 08/24/2023   10:22 AM  GAD 7 : Generalized Anxiety Score  Nervous, Anxious, on Edge 1 0 0 1  Control/stop worrying 0 0 1 1  Worry too much - different things 1 0 0 1  Trouble relaxing 0 0 0 1  Restless 0 0 0 0  Easily annoyed or irritable 1 0 1 1  Afraid - awful might happen 0 0 0 1  Total GAD 7 Score 3 0 2 6  Anxiety Difficulty Not difficult at all  Not difficult at all Somewhat difficult   Relevant past medical, surgical, family and social history reviewed and updated as indicated. Interim medical history since our last visit reviewed. Allergies and medications reviewed and updated.  Review of Systems  Constitutional:  Negative for activity change, appetite change, diaphoresis, fatigue and fever.  Respiratory:  Negative for cough, chest tightness and shortness of breath.   Cardiovascular:  Negative for chest pain, palpitations and leg swelling.  Gastrointestinal: Negative.   Musculoskeletal:  Positive for arthralgias.  Neurological: Negative.   Psychiatric/Behavioral: Negative.     Per HPI unless specifically indicated above     Objective:    BP 123/64 (BP Location: Left Arm, Cuff Size: Normal)   Pulse 73   Temp 98.3 F (36.8 C) (Oral)   Ht 5' 5.8 (1.671 m)   Wt 176 lb 6.4 oz (80 kg)   LMP  (LMP Unknown)   SpO2 98%   BMI 28.65 kg/m   Wt Readings from Last 3 Encounters:  04/27/24 176 lb 6.4 oz (80 kg)  02/28/24 177 lb (80.3 kg)  01/18/24 182 lb 8 oz (82.8 kg)    Physical Exam Vitals and nursing note reviewed.  Constitutional:      General: She is awake. She is not in acute distress.    Appearance: She is well-developed and well-groomed. She is not ill-appearing or toxic-appearing.  HENT:     Head:  Normocephalic.     Right Ear: Hearing and external ear normal.     Left Ear: Hearing and external ear normal.  Eyes:     General: Lids  are normal.        Right eye: No discharge.        Left eye: No discharge.     Conjunctiva/sclera: Conjunctivae normal.     Pupils: Pupils are equal, round, and reactive to light.  Neck:     Thyroid : No thyromegaly.     Vascular: No carotid bruit.  Cardiovascular:     Rate and Rhythm: Normal rate and regular rhythm.     Heart sounds: Normal heart sounds. No murmur heard.    No gallop.  Pulmonary:     Effort: Pulmonary effort is normal. No accessory muscle usage or respiratory distress.     Breath sounds: Normal breath sounds.  Abdominal:     General: Bowel sounds are normal. There is no distension.     Palpations: Abdomen is soft.     Tenderness: There is no abdominal tenderness.  Musculoskeletal:     Cervical back: Normal range of motion and neck supple.     Right lower leg: No edema.     Left lower leg: No edema.  Lymphadenopathy:     Cervical: No cervical adenopathy.  Skin:    General: Skin is warm and dry.  Neurological:     Mental Status: She is alert and oriented to person, place, and time.     Deep Tendon Reflexes: Reflexes are normal and symmetric.     Reflex Scores:      Brachioradialis reflexes are 2+ on the right side and 2+ on the left side.      Patellar reflexes are 2+ on the right side and 2+ on the left side.    Comments: Year 2025, Month December -- reports date as the 12th and Friday. Current President Trump.  Psychiatric:        Attention and Perception: Attention normal.        Mood and Affect: Mood normal.        Speech: Speech normal.        Behavior: Behavior normal. Behavior is cooperative.        Thought Content: Thought content normal.    Results for orders placed or performed in visit on 04/22/24  CUP PACEART REMOTE DEVICE CHECK   Collection Time: 04/21/24 11:28 PM  Result Value Ref Range   Date Time  Interrogation Session [id number]    Pulse Generator Manufacturer [employer]    Pulse Gen Model LNQ22 LINQ II    Pulse Gen Serial Number [id number]    Clinic Name [facility]    Implantable Pulse Generator Type ICM/ILR    Implantable Pulse Generator Implant Date [id number]       Assessment & Plan:   Problem List Items Addressed This Visit       Cardiovascular and Mediastinum   Hypertension   Chronic, stable.  BP at goal in office today. Will continue Amlodipine  5 MG daily and if elevations noted in future will increase to 10 MG.  Remain off HCTZ due to urinary urgency with this in past.  LABS: BMP to check K+ with leg cramps.  Focus on DASH diet and regular exercise at home.  Monitor BP daily.        Relevant Medications   amLODipine  (NORVASC ) 5 MG tablet   aspirin  EC 81 MG tablet   rosuvastatin  (CRESTOR ) 20 MG tablet   Other Relevant Orders   TSH   Basic metabolic panel with GFR     Respiratory   OSA (obstructive sleep apnea)   Chronic, ongoing. Using  CPAP nightly as instructed. Educated her on this. Will have nursing staff call her to assist in changing settings, as suspect his is reason for her congestion. Has noticed this since starting CPAP.        Musculoskeletal and Integument   Rheumatoid arthritis, seropositive (HCC) - Primary   Chronic, ongoing.  Continue collaboration with rheumatology + current medication regimen.  Recent notes and labs reviewed.        Relevant Medications   aspirin  EC 81 MG tablet   Other Relevant Orders   Magnesium     Other   Memory changes   Ongoing and stable at present.  6CIT was 4 in past and MMSE 28/30 in past. Difficulty with short term recall noted on both assessments.  Remainder of check was stable and remains stable today. Continue Aricept  5 MG daily and adjust as needed.  Educated on medication and possible side effects.  Having no ADR with this.  Imaging did not note any atrophy. Monitor BP and maintain good control.  Continue to  collaborate with neurology. She does not wish to increase Aricept  dosing at present.      Long term methotrexate  user   Continue collaboration with rheumatology, recent notes and labs reviewed.      Hyperlipidemia   Chronic, ongoing.  Continue current medication regimen and adjust as needed.  Lipid panel today. Goal is not to reduce Crestor  dosing, but she is having leg cramps at night. Will trial Magnesium at night to see if helps this.      Relevant Medications   amLODipine  (NORVASC ) 5 MG tablet   aspirin  EC 81 MG tablet   rosuvastatin  (CRESTOR ) 20 MG tablet   Other Relevant Orders   Lipid Panel w/o Chol/HDL Ratio   History of stroke in adulthood   Stable at this time with reassuring neuro exam.  Continue all medications as ordered.  Educated her on symptoms to immediately go to ER for.  Goals A1c < 6.5%, LDL <55, and BP <130/80.  Loop recorder in place.      Depression, major, single episode, mild   Stable without medication.  Denies SI/HI.  Discussed treatment options with her in past, such as SSRI.  Would benefit low dose Zoloft 25 MG in future if needed.  Discussed side effects and BLACK BOX warning with her.  Recommend she discuss with daughter and will start medication if requested by her in future.      Other Visit Diagnoses       Encounter for screening mammogram for malignant neoplasm of breast       Mammogram ordered and instructed how to schedule.   Relevant Orders   MM 3D SCREENING MAMMOGRAM BILATERAL BREAST        Follow up plan: Return in about 6 months (around 10/26/2024) for HTN/HLD, RA, DEPRESSION, MEMORY.

## 2024-04-27 NOTE — Assessment & Plan Note (Signed)
 Stable without medication.  Denies SI/HI.  Discussed treatment options with her in past, such as SSRI.  Would benefit low dose Zoloft 25 MG in future if needed.  Discussed side effects and BLACK BOX warning with her.  Recommend she discuss with daughter and will start medication if requested by her in future.

## 2024-04-27 NOTE — Assessment & Plan Note (Signed)
 Chronic, ongoing. Using CPAP nightly as instructed. Educated her on this. Will have nursing staff call her to assist in changing settings, as suspect his is reason for her congestion. Has noticed this since starting CPAP.

## 2024-04-27 NOTE — Assessment & Plan Note (Signed)
 Ongoing and stable at present.  6CIT was 4 in past and MMSE 28/30 in past. Difficulty with short term recall noted on both assessments.  Remainder of check was stable and remains stable today. Continue Aricept  5 MG daily and adjust as needed.  Educated on medication and possible side effects.  Having no ADR with this.  Imaging did not note any atrophy. Monitor BP and maintain good control.  Continue to collaborate with neurology. She does not wish to increase Aricept  dosing at present.

## 2024-04-27 NOTE — Assessment & Plan Note (Signed)
 Stable at this time with reassuring neuro exam.  Continue all medications as ordered.  Educated her on symptoms to immediately go to ER for.  Goals A1c < 6.5%, LDL <55, and BP <130/80.  Loop recorder in place.

## 2024-04-27 NOTE — Assessment & Plan Note (Signed)
 Continue collaboration with rheumatology, recent notes and labs reviewed.

## 2024-04-27 NOTE — Assessment & Plan Note (Signed)
 Chronic, stable.  BP at goal in office today. Will continue Amlodipine  5 MG daily and if elevations noted in future will increase to 10 MG.  Remain off HCTZ due to urinary urgency with this in past.  LABS: BMP to check K+ with leg cramps.  Focus on DASH diet and regular exercise at home.  Monitor BP daily.

## 2024-04-27 NOTE — Assessment & Plan Note (Signed)
 Chronic, ongoing.  Continue collaboration with rheumatology + current medication regimen.  Recent notes and labs reviewed.

## 2024-04-28 ENCOUNTER — Ambulatory Visit: Payer: Self-pay | Admitting: Nurse Practitioner

## 2024-04-28 LAB — BASIC METABOLIC PANEL WITH GFR
BUN/Creatinine Ratio: 20 (ref 12–28)
BUN: 14 mg/dL (ref 8–27)
CO2: 26 mmol/L (ref 20–29)
Calcium: 9.3 mg/dL (ref 8.7–10.3)
Chloride: 102 mmol/L (ref 96–106)
Creatinine, Ser: 0.7 mg/dL (ref 0.57–1.00)
Glucose: 85 mg/dL (ref 70–99)
Sodium: 139 mmol/L (ref 134–144)
eGFR: 90 mL/min/1.73 (ref >59)

## 2024-04-28 LAB — LIPID PANEL W/O CHOL/HDL RATIO
Cholesterol, Total: 159 mg/dL (ref 100–199)
HDL: 70 mg/dL (ref >39)
LDL Chol Calc (NIH): 76 mg/dL (ref 0–99)
Triglycerides: 68 mg/dL (ref 0–149)
VLDL Cholesterol Cal: 13 mg/dL (ref 5–40)

## 2024-04-28 LAB — MAGNESIUM: Magnesium: 2.1 mg/dL (ref 1.6–2.3)

## 2024-04-28 LAB — TSH: TSH: 1.15 u[IU]/mL (ref 0.450–4.500)

## 2024-04-28 NOTE — Progress Notes (Signed)
 Good morning, please let Angela Watts know her labs have returned and overall remain stable. No medication changes needed. Any questions? Keep being amazing!!  Thank you for allowing me to participate in your care.  I appreciate you. Kindest regards, Angela Watts

## 2024-05-04 ENCOUNTER — Ambulatory Visit: Payer: Self-pay | Admitting: Cardiology

## 2024-05-18 DIAGNOSIS — F320 Major depressive disorder, single episode, mild: Secondary | ICD-10-CM | POA: Diagnosis not present

## 2024-05-18 DIAGNOSIS — I10 Essential (primary) hypertension: Secondary | ICD-10-CM | POA: Diagnosis not present

## 2024-05-18 DIAGNOSIS — I69320 Aphasia following cerebral infarction: Secondary | ICD-10-CM | POA: Diagnosis not present

## 2024-05-18 DIAGNOSIS — Z6828 Body mass index (BMI) 28.0-28.9, adult: Secondary | ICD-10-CM | POA: Diagnosis not present

## 2024-05-18 DIAGNOSIS — E782 Mixed hyperlipidemia: Secondary | ICD-10-CM | POA: Diagnosis not present

## 2024-05-18 DIAGNOSIS — R413 Other amnesia: Secondary | ICD-10-CM | POA: Diagnosis not present

## 2024-05-18 DIAGNOSIS — E66812 Obesity, class 2: Secondary | ICD-10-CM | POA: Diagnosis not present

## 2024-05-18 DIAGNOSIS — M0589 Other rheumatoid arthritis with rheumatoid factor of multiple sites: Secondary | ICD-10-CM | POA: Diagnosis not present

## 2024-05-23 ENCOUNTER — Ambulatory Visit

## 2024-05-23 DIAGNOSIS — I639 Cerebral infarction, unspecified: Secondary | ICD-10-CM

## 2024-05-23 LAB — CUP PACEART REMOTE DEVICE CHECK
Date Time Interrogation Session: 20260106233744
Implantable Pulse Generator Implant Date: 20250903

## 2024-05-24 ENCOUNTER — Encounter

## 2024-05-24 NOTE — Progress Notes (Signed)
 Remote Loop Recorder Transmission

## 2024-05-26 ENCOUNTER — Ambulatory Visit: Payer: Self-pay | Admitting: Cardiology

## 2024-06-14 ENCOUNTER — Ambulatory Visit (INDEPENDENT_AMBULATORY_CARE_PROVIDER_SITE_OTHER): Payer: Self-pay | Admitting: Emergency Medicine

## 2024-06-14 VITALS — Ht 66.0 in | Wt 178.0 lb

## 2024-06-14 DIAGNOSIS — Z0000 Encounter for general adult medical examination without abnormal findings: Secondary | ICD-10-CM

## 2024-06-14 NOTE — Progress Notes (Signed)
 "  Chief Complaint  Patient presents with   Medicare Wellness     Subjective:   Angela Watts is a 74 y.o. female who presents for a Medicare Annual Wellness Visit.  Visit info / Clinical Intake: Medicare Wellness Visit Type:: Subsequent Annual Wellness Visit Persons participating in visit and providing information:: patient Medicare Wellness Visit Mode:: Video Since this visit was completed virtually, some vitals may be partially provided or unavailable. Missing vitals are due to the limitations of the virtual format.: Documented vitals are patient reported If Telephone or Video please confirm:: I connected with patient using audio/video enable telemedicine. I verified patient identity with two identifiers, discussed telehealth limitations, and patient agreed to proceed. Patient Location:: home Provider Location:: clinic office Interpreter Needed?: No Pre-visit prep was completed: yes AWV questionnaire completed by patient prior to visit?: no Living arrangements:: (!) lives alone Patient's Overall Health Status Rating: good Typical amount of pain: some Does pain affect daily life?: (!) yes (sometimes) Are you currently prescribed opioids?: no  Dietary Habits and Nutritional Risks How many meals a day?: 3 Eats fruit and vegetables daily?: yes Most meals are obtained by: preparing own meals In the last 2 weeks, have you had any of the following?: none Diabetic:: no  Functional Status Activities of Daily Living (to include ambulation/medication): Independent Ambulation: Independent with device- listed below Home Assistive Devices/Equipment: Eyeglasses; CPAP Medication Administration: Independent Home Management (perform basic housework or laundry): Independent Manage your own finances?: yes Primary transportation is: driving Concerns about vision?: no *vision screening is required for WTM* Concerns about hearing?: no  Fall Screening Falls in the past year?: 0 Number of  falls in past year: 0 Was there an injury with Fall?: 0 Fall Risk Category Calculator: 0 Patient Fall Risk Level: Low Fall Risk  Fall Risk Patient at Risk for Falls Due to: No Fall Risks Fall risk Follow up: Falls evaluation completed  Home and Transportation Safety: All rugs have non-skid backing?: (!) no All stairs or steps have railings?: yes Grab bars in the bathtub or shower?: yes Have non-skid surface in bathtub or shower?: yes Good home lighting?: yes Regular seat belt use?: yes Hospital stays in the last year:: (!) yes How many hospital stays:: 1 Reason: stroke  Cognitive Assessment Difficulty concentrating, remembering, or making decisions? : yes (due to stroke) Will 6CIT or Mini Cog be Completed: yes What year is it?: 0 points What month is it?: 0 points Give patient an address phrase to remember (5 components): [address] [place] About what time is it?: 0 points Count backwards from 20 to 1: 0 points Say the months of the year in reverse: 0 points Repeat the address phrase from earlier: 2 points 6 CIT Score: 2 points  Advance Directives (For Healthcare) Does Patient Have a Medical Advance Directive?: Yes Does patient want to make changes to medical advance directive?: No - Patient declined Type of Advance Directive: Healthcare Power of [place]; Living will Copy of Healthcare Power of Attorney in Chart?: Yes - validated most recent copy scanned in chart (See row information) Copy of Living Will in Chart?: Yes - validated most recent copy scanned in chart (See row information)  Reviewed/Updated  Reviewed/Updated: Reviewed All (Medical, Surgical, Family, Medications, Allergies, Care Teams, Patient Goals)    Allergies (verified) Prednisone   Current Medications (verified) Outpatient Encounter Medications as of 06/14/2024  Medication Sig   acetaminophen  (TYLENOL ) 500 MG tablet Take 500 mg by mouth every 6 (six) hours as  needed.   amLODipine  (NORVASC ) 5 MG  tablet Take 1 tablet (5 mg total) by mouth daily.   aspirin  EC 81 MG tablet Take 1 tablet (81 mg total) by mouth daily. Swallow whole.   cyanocobalamin  (VITAMIN B12) 1000 MCG tablet Take 1,000 mcg by mouth daily.   donepezil  (ARICEPT ) 5 MG tablet Take 1 tablet (5 mg total) by mouth at bedtime.   famotidine  (PEPCID ) 20 MG tablet Take 20 mg by mouth daily as needed for heartburn or indigestion.   fluticasone  (FLONASE ) 50 MCG/ACT nasal spray Place 2 sprays into both nostrils daily. (Patient taking differently: Place 2 sprays into both nostrils daily. PRN)   folic acid  (FOLVITE ) 1 MG tablet Take 1 tablet by mouth daily.   methotrexate  (RHEUMATREX) 2.5 MG tablet Take 7 tabs (17.5 MG ) once a week for 12 weeks   Multiple Vitamin (MULTIVITAMIN) capsule Take 1 capsule by mouth daily.   rosuvastatin  (CRESTOR ) 20 MG tablet Take 1 tablet (20 mg total) by mouth daily.   No facility-administered encounter medications on file as of 06/14/2024.    History: Past Medical History:  Diagnosis Date   Abnormal Pap smear of cervix    Abscess of bursa, left hip    Arthritis    Colon adenomas    GERD (gastroesophageal reflux disease)    Hyperlipidemia    Hypertension    Psoriasis    Rheumatoid arthritis flare (HCC)    TIA (transient ischemic attack) 10/2023   Past Surgical History:  Procedure Laterality Date   ABDOMINAL HYSTERECTOMY  2003   BREAST BIOPSY Left 06/01/2016   us  bx done by dr byrnett 6:00 2cmfn intraducal papilloma with calcs   BREAST BIOPSY Left 02/05/2019   Affirm bx-X clip-benign parenchyma with fragments of sclerosed intraductal papilloma   BREAST BIOPSY Left 2012   us  bx done by Dr. Dessa, intraductal papilloma   BREAST CYST ASPIRATION Left 2011   NEG/ Br Byrnett   BREAST CYST ASPIRATION Left 06/01/2016   dr byrnett 5:00 3cmfn aspirated with complete resolution   COLONOSCOPY WITH PROPOFOL  N/A 06/11/2015   Procedure: COLONOSCOPY WITH PROPOFOL ;  Surgeon: Lamar ONEIDA Holmes, MD;   Location: [facility] ENDOSCOPY;  Service: Endoscopy;  Laterality: N/A;   COLONOSCOPY WITH PROPOFOL  N/A 07/14/2018   Procedure: COLONOSCOPY WITH PROPOFOL ;  Surgeon: Holmes Lamar ONEIDA, MD;  Location: [facility] ENDOSCOPY;  Service: Endoscopy;  Laterality: N/A;   CYST EXCISION Left    breast   Family History  Problem Relation Age of Onset   Hypertension Mother    Arthritis Mother    Breast cancer Mother        18   Cancer Mother        breast   Hypertension Father    Lung cancer Father    Diabetes Sister    Hypertension Sister    Hypertension Sister    Hypertension Sister    Hypertension Sister    Hypertension Brother    Hypertension Brother    Cancer Brother        lung   Social History   Occupational History   Not on file  Tobacco Use   Smoking status: Never   Smokeless tobacco: Never  Vaping Use   Vaping status: Never Used  Substance and Sexual Activity   Alcohol use: Yes    Comment: on occasion/socially, 1 wine or cocktail monthly or less   Drug use: No   Sexual activity: Never   Tobacco Counseling Counseling given: Not Answered  SDOH Screenings  Food Insecurity: No Food Insecurity (06/14/2024)  Housing: Low Risk (06/14/2024)  Transportation Needs: No Transportation Needs (06/14/2024)  Utilities: Not At Risk (06/14/2024)  Alcohol Screen: Low Risk (06/02/2023)  Depression (PHQ2-9): Low Risk (06/14/2024)  Recent Concern: Depression (PHQ2-9) - Medium Risk (04/27/2024)  Financial Resource Strain: Low Risk  (11/15/2023)   Received from [facility]  Physical Activity: Insufficiently Active (06/14/2024)  Social Connections: Moderately Integrated (06/14/2024)  Stress: No Stress Concern Present (06/14/2024)  Tobacco Use: Low Risk (06/14/2024)  Health Literacy: Adequate Health Literacy (06/14/2024)   See flowsheets for full screening details  Depression Screen PHQ 2 & 9 Depression Scale- Over the past 2 weeks, how often have you been bothered by any of the following  problems? Little interest or pleasure in doing things: 0 Feeling down, depressed, or hopeless (PHQ Adolescent also includes...irritable): 1 (due to weather) PHQ-2 Total Score: 1 Trouble falling or staying asleep, or sleeping too much: 1 Feeling tired or having little energy: 1 Poor appetite or overeating (PHQ Adolescent also includes...weight loss): 0 Feeling bad about yourself - or that you are a failure or have let yourself or your family down: 0 Trouble concentrating on things, such as reading the newspaper or watching television (PHQ Adolescent also includes...like school work): 0 Moving or speaking so slowly that other people could have noticed. Or the opposite - being so fidgety or restless that you have been moving around a lot more than usual: 0 Thoughts that you would be better off dead, or of hurting yourself in some way: 0 PHQ-9 Total Score: 3 If you checked off any problems, how difficult have these problems made it for you to do your work, take care of things at home, or get along with other people?: Not difficult at all  Depression Treatment Depression Interventions/Treatment : [id number] Score <4 Follow-up Not Indicated     Goals Addressed             This Visit's Progress    Patient Stated   Not on track    Exercise more and lose some weight             Objective:    Today's Vitals   06/14/24 1004  Weight: 178 lb (80.7 kg)  Height: 5' 6 (1.676 m)   Body mass index is 28.73 kg/m.  Hearing/Vision screen Hearing Screening - Comments:: Denies hearing loss  Vision Screening - Comments:: UTD @ [facility] Immunizations and Health Maintenance Health Maintenance  Topic Date Due   Colonoscopy  11/18/2024 (Originally 07/15/2023)   COVID-19 Vaccine (9 - Pfizer risk 2025-26 season) 08/28/2024   DTaP/Tdap/Td (3 - Td or Tdap) 03/17/2025   Mammogram  04/16/2025   Medicare Annual Wellness (AWV)  06/14/2025   Bone Density Scan  04/22/2028   Pneumococcal  Vaccine: 50+ Years  Completed   Influenza Vaccine  Completed   Hepatitis C Screening  Completed   Zoster Vaccines- Shingrix  Completed   Meningococcal B Vaccine  Aged Out   Hepatitis B Vaccines 19-59 Average Risk  Discontinued        Assessment/Plan:  This is a routine wellness examination for Angela Watts.  Patient Care Team: [name], Jolene T, NP as PCP - General (Nurse Practitioner) Tobie Lady POUR, MD as Consulting Physician (Rheumatology) [place], [facility] (Optometry) [place], Sherran JAYSON RIGGERS (Neurology) Sadie Manna, MD as Referring Physician (Sleep Medicine) Leora Lynwood SAUNDERS, MD as Referring Physician (Orthopedic Surgery) Riddle, Suzann, NP as Nurse Practitioner (Cardiology)  I  have personally reviewed and noted the following in the patients chart:   Medical and social history Use of alcohol, tobacco or illicit drugs  Current medications and supplements including opioid prescriptions. Functional ability and status Nutritional status Physical activity Advanced directives List of other physicians Hospitalizations, surgeries, and ER visits in previous 12 months Vitals Screenings to include cognitive, depression, and falls Referrals and appointments  No orders of the defined types were placed in this encounter.  In addition, I have reviewed and discussed with patient certain preventive protocols, quality metrics, and best practice recommendations. A written personalized care plan for preventive services as well as general preventive health recommendations were provided to patient.   Vina Ned, CMA   06/14/2024   Return in 1 year (on 06/18/2025) for Medicare Annual Wellness Visit.  After Visit Summary: (Mail) Due to this being a telephonic visit, the after visit summary with patients personalized plan was offered to patient via mail   Nurse Notes:  6 CIT Score - 2 Colonoscopy scheduled for 07/09/24 "

## 2024-06-14 NOTE — Patient Instructions (Signed)
 Ms. Eppard,  Thank you for taking the time for your Medicare Wellness Visit. I appreciate your continued commitment to your health goals. Please review the care plan we discussed, and feel free to reach out if I can assist you further.  Please note that Annual Wellness Visits do not include a physical exam. Some assessments may be limited, especially if the visit was conducted virtually. If needed, we may recommend an in-person follow-up with your provider.  Ongoing Care Seeing your primary care provider every 3 to 6 months helps us  monitor your health and provide consistent, personalized care.    Referrals If a referral was made during today's visit and you haven't received any updates within two weeks, please contact the referred provider directly to check on the status.  Recommended Screenings:  Keep up the good work!  Health Maintenance  Topic Date Due   Medicare Annual Wellness Visit  06/01/2024   Colon Cancer Screening  11/18/2024*   COVID-19 Vaccine (9 - Pfizer risk 2025-26 season) 08/28/2024   DTaP/Tdap/Td vaccine (3 - Td or Tdap) 03/17/2025   Breast Cancer Screening  04/16/2025   Osteoporosis screening with Bone Density Scan  04/22/2028   Pneumococcal Vaccine for age over 56  Completed   Flu Shot  Completed   Hepatitis C Screening  Completed   Zoster (Shingles) Vaccine  Completed   Meningitis B Vaccine  Aged Out   Hepatitis B Vaccine  Discontinued  *Topic was postponed. The date shown is not the original due date.       06/14/2024   10:08 AM  Advanced Directives  Does Patient Have a Medical Advance Directive? Yes  Type of Estate Agent of [place];Living will  Does patient want to make changes to medical advance directive? No - Patient declined  Copy of Healthcare Power of Attorney in Chart? Yes - validated most recent copy scanned in chart (See row information)    Vision: Annual vision screenings are recommended for early detection of glaucoma,  cataracts, and diabetic retinopathy. These exams can also reveal signs of chronic conditions such as diabetes and high blood pressure.  Dental: Annual dental screenings help detect early signs of oral cancer, gum disease, and other conditions linked to overall health, including heart disease and diabetes.  Please see the attached documents for additional preventive care recommendations.

## 2024-06-23 ENCOUNTER — Ambulatory Visit

## 2024-06-25 ENCOUNTER — Encounter

## 2024-07-09 ENCOUNTER — Encounter: Admission: RE | Payer: Self-pay | Source: Home / Self Care

## 2024-07-09 ENCOUNTER — Ambulatory Visit: Admission: RE | Admit: 2024-07-09 | Source: Home / Self Care

## 2024-07-24 ENCOUNTER — Ambulatory Visit

## 2024-08-24 ENCOUNTER — Ambulatory Visit

## 2024-09-24 ENCOUNTER — Ambulatory Visit

## 2024-10-25 ENCOUNTER — Ambulatory Visit

## 2024-10-26 ENCOUNTER — Ambulatory Visit: Admitting: Nurse Practitioner

## 2024-11-25 ENCOUNTER — Ambulatory Visit

## 2024-12-26 ENCOUNTER — Ambulatory Visit

## 2025-01-26 ENCOUNTER — Ambulatory Visit

## 2025-02-26 ENCOUNTER — Ambulatory Visit

## 2025-03-29 ENCOUNTER — Ambulatory Visit

## 2025-04-29 ENCOUNTER — Ambulatory Visit

## 2025-05-30 ENCOUNTER — Ambulatory Visit

## 2025-06-18 ENCOUNTER — Ambulatory Visit
